# Patient Record
Sex: Female | Born: 1940 | Race: White | Hispanic: No | Smoking: Former smoker
Health system: Southern US, Community
[De-identification: ages and names within clinical notes are randomized; demographics above are authoritative.]

## PROBLEM LIST (undated history)

## (undated) DIAGNOSIS — F29 Unspecified psychosis not due to a substance or known physiological condition: Secondary | ICD-10-CM

## (undated) DIAGNOSIS — I639 Cerebral infarction, unspecified: Secondary | ICD-10-CM

## (undated) DIAGNOSIS — C8590 Non-Hodgkin lymphoma, unspecified, unspecified site: Secondary | ICD-10-CM

## (undated) DIAGNOSIS — J449 Chronic obstructive pulmonary disease, unspecified: Secondary | ICD-10-CM

## (undated) DIAGNOSIS — K7030 Alcoholic cirrhosis of liver without ascites: Secondary | ICD-10-CM

## (undated) DIAGNOSIS — I10 Essential (primary) hypertension: Secondary | ICD-10-CM

## (undated) DIAGNOSIS — E7800 Pure hypercholesterolemia, unspecified: Secondary | ICD-10-CM

## (undated) DIAGNOSIS — E039 Hypothyroidism, unspecified: Secondary | ICD-10-CM

## (undated) DIAGNOSIS — J45909 Unspecified asthma, uncomplicated: Secondary | ICD-10-CM

## (undated) DIAGNOSIS — F1011 Alcohol abuse, in remission: Secondary | ICD-10-CM

---

## 2001-05-17 ENCOUNTER — Inpatient Hospital Stay (HOSPITAL_COMMUNITY): Admission: EM | Admit: 2001-05-17 | Discharge: 2001-05-30 | Payer: Self-pay | Admitting: Emergency Medicine

## 2001-05-18 ENCOUNTER — Encounter: Payer: Self-pay | Admitting: Family Medicine

## 2001-05-24 ENCOUNTER — Encounter: Payer: Self-pay | Admitting: Family Medicine

## 2001-05-26 ENCOUNTER — Encounter: Payer: Self-pay | Admitting: Family Medicine

## 2001-06-06 ENCOUNTER — Encounter: Admission: RE | Admit: 2001-06-06 | Discharge: 2001-06-06 | Payer: Self-pay | Admitting: Family Medicine

## 2001-06-18 ENCOUNTER — Ambulatory Visit (HOSPITAL_COMMUNITY): Admission: RE | Admit: 2001-06-18 | Discharge: 2001-06-18 | Payer: Self-pay | Admitting: Family Medicine

## 2001-06-18 ENCOUNTER — Encounter: Payer: Self-pay | Admitting: Family Medicine

## 2002-10-17 ENCOUNTER — Emergency Department (HOSPITAL_COMMUNITY): Admission: EM | Admit: 2002-10-17 | Discharge: 2002-10-17 | Payer: Self-pay | Admitting: *Deleted

## 2002-10-18 ENCOUNTER — Inpatient Hospital Stay (HOSPITAL_COMMUNITY): Admission: EM | Admit: 2002-10-18 | Discharge: 2002-10-28 | Payer: Self-pay | Admitting: *Deleted

## 2002-11-30 ENCOUNTER — Inpatient Hospital Stay (HOSPITAL_COMMUNITY): Admission: AD | Admit: 2002-11-30 | Discharge: 2002-12-04 | Payer: Self-pay | Admitting: Psychiatry

## 2003-01-09 ENCOUNTER — Encounter: Payer: Self-pay | Admitting: Emergency Medicine

## 2003-01-09 ENCOUNTER — Inpatient Hospital Stay (HOSPITAL_COMMUNITY): Admission: EM | Admit: 2003-01-09 | Discharge: 2003-02-13 | Payer: Self-pay | Admitting: Emergency Medicine

## 2003-01-10 ENCOUNTER — Encounter: Payer: Self-pay | Admitting: Cardiology

## 2003-01-10 ENCOUNTER — Encounter: Payer: Self-pay | Admitting: Pulmonary Disease

## 2003-01-11 ENCOUNTER — Encounter: Payer: Self-pay | Admitting: Pulmonary Disease

## 2003-01-12 ENCOUNTER — Encounter: Payer: Self-pay | Admitting: Pulmonary Disease

## 2003-01-13 ENCOUNTER — Encounter: Payer: Self-pay | Admitting: Pulmonary Disease

## 2003-01-13 ENCOUNTER — Encounter: Payer: Self-pay | Admitting: Critical Care Medicine

## 2003-01-14 ENCOUNTER — Encounter: Payer: Self-pay | Admitting: Pulmonary Disease

## 2003-01-15 ENCOUNTER — Encounter: Payer: Self-pay | Admitting: Pulmonary Disease

## 2003-01-16 ENCOUNTER — Encounter: Payer: Self-pay | Admitting: Pulmonary Disease

## 2003-01-16 ENCOUNTER — Encounter: Payer: Self-pay | Admitting: Critical Care Medicine

## 2003-01-17 ENCOUNTER — Encounter: Payer: Self-pay | Admitting: Pulmonary Disease

## 2003-01-17 ENCOUNTER — Encounter: Payer: Self-pay | Admitting: Critical Care Medicine

## 2003-01-19 ENCOUNTER — Encounter: Payer: Self-pay | Admitting: Pulmonary Disease

## 2003-01-20 ENCOUNTER — Encounter: Payer: Self-pay | Admitting: Pulmonary Disease

## 2003-01-21 ENCOUNTER — Encounter: Payer: Self-pay | Admitting: Pulmonary Disease

## 2003-01-22 ENCOUNTER — Encounter: Payer: Self-pay | Admitting: Pulmonary Disease

## 2003-01-23 ENCOUNTER — Encounter: Payer: Self-pay | Admitting: Pulmonary Disease

## 2003-01-24 ENCOUNTER — Encounter: Payer: Self-pay | Admitting: Pulmonary Disease

## 2003-01-27 ENCOUNTER — Encounter: Payer: Self-pay | Admitting: Pulmonary Disease

## 2003-01-28 ENCOUNTER — Encounter: Payer: Self-pay | Admitting: Internal Medicine

## 2003-01-29 ENCOUNTER — Encounter: Payer: Self-pay | Admitting: Internal Medicine

## 2003-01-30 ENCOUNTER — Encounter: Payer: Self-pay | Admitting: Internal Medicine

## 2003-01-31 ENCOUNTER — Encounter: Payer: Self-pay | Admitting: Internal Medicine

## 2003-02-05 ENCOUNTER — Encounter: Payer: Self-pay | Admitting: Internal Medicine

## 2003-06-01 ENCOUNTER — Encounter: Payer: Self-pay | Admitting: Emergency Medicine

## 2003-06-01 ENCOUNTER — Inpatient Hospital Stay (HOSPITAL_COMMUNITY): Admission: EM | Admit: 2003-06-01 | Discharge: 2003-06-03 | Payer: Self-pay | Admitting: Emergency Medicine

## 2003-06-21 ENCOUNTER — Emergency Department (HOSPITAL_COMMUNITY): Admission: EM | Admit: 2003-06-21 | Discharge: 2003-06-22 | Payer: Self-pay | Admitting: *Deleted

## 2003-06-21 ENCOUNTER — Encounter: Payer: Self-pay | Admitting: *Deleted

## 2003-07-30 ENCOUNTER — Encounter: Payer: Self-pay | Admitting: Emergency Medicine

## 2003-07-30 ENCOUNTER — Inpatient Hospital Stay (HOSPITAL_COMMUNITY): Admission: EM | Admit: 2003-07-30 | Discharge: 2003-08-01 | Payer: Self-pay | Admitting: Emergency Medicine

## 2003-08-04 ENCOUNTER — Encounter: Admission: RE | Admit: 2003-08-04 | Discharge: 2003-08-19 | Payer: Self-pay | Admitting: Family Medicine

## 2003-08-31 ENCOUNTER — Encounter: Payer: Self-pay | Admitting: Emergency Medicine

## 2003-08-31 ENCOUNTER — Inpatient Hospital Stay (HOSPITAL_COMMUNITY): Admission: AD | Admit: 2003-08-31 | Discharge: 2003-09-04 | Payer: Self-pay | Admitting: Emergency Medicine

## 2003-09-30 ENCOUNTER — Encounter: Admission: RE | Admit: 2003-09-30 | Discharge: 2003-09-30 | Payer: Self-pay | Admitting: *Deleted

## 2003-10-30 ENCOUNTER — Emergency Department (HOSPITAL_COMMUNITY): Admission: EM | Admit: 2003-10-30 | Discharge: 2003-10-30 | Payer: Self-pay | Admitting: Emergency Medicine

## 2003-12-12 ENCOUNTER — Inpatient Hospital Stay (HOSPITAL_COMMUNITY): Admission: EM | Admit: 2003-12-12 | Discharge: 2003-12-16 | Payer: Self-pay | Admitting: Emergency Medicine

## 2003-12-26 ENCOUNTER — Inpatient Hospital Stay (HOSPITAL_COMMUNITY): Admission: EM | Admit: 2003-12-26 | Discharge: 2004-01-06 | Payer: Self-pay | Admitting: Emergency Medicine

## 2004-02-11 ENCOUNTER — Emergency Department (HOSPITAL_COMMUNITY): Admission: EM | Admit: 2004-02-11 | Discharge: 2004-02-11 | Payer: Self-pay | Admitting: Emergency Medicine

## 2004-03-11 ENCOUNTER — Encounter: Admission: RE | Admit: 2004-03-11 | Discharge: 2004-03-11 | Payer: Self-pay | Admitting: Internal Medicine

## 2004-03-24 ENCOUNTER — Emergency Department (HOSPITAL_COMMUNITY): Admission: EM | Admit: 2004-03-24 | Discharge: 2004-03-24 | Payer: Self-pay | Admitting: Family Medicine

## 2004-03-30 ENCOUNTER — Encounter: Admission: RE | Admit: 2004-03-30 | Discharge: 2004-03-30 | Payer: Self-pay | Admitting: Internal Medicine

## 2004-04-13 ENCOUNTER — Encounter: Admission: RE | Admit: 2004-04-13 | Discharge: 2004-04-13 | Payer: Self-pay | Admitting: Internal Medicine

## 2004-04-26 ENCOUNTER — Encounter: Admission: RE | Admit: 2004-04-26 | Discharge: 2004-04-26 | Payer: Self-pay | Admitting: Internal Medicine

## 2004-04-29 ENCOUNTER — Encounter: Admission: RE | Admit: 2004-04-29 | Discharge: 2004-04-29 | Payer: Self-pay | Admitting: Internal Medicine

## 2004-05-20 ENCOUNTER — Encounter: Admission: RE | Admit: 2004-05-20 | Discharge: 2004-05-20 | Payer: Self-pay | Admitting: Internal Medicine

## 2004-08-13 ENCOUNTER — Encounter: Admission: RE | Admit: 2004-08-13 | Discharge: 2004-08-13 | Payer: Self-pay | Admitting: Internal Medicine

## 2004-11-09 ENCOUNTER — Ambulatory Visit: Payer: Self-pay | Admitting: Internal Medicine

## 2005-01-27 ENCOUNTER — Ambulatory Visit: Payer: Self-pay | Admitting: Internal Medicine

## 2005-05-12 ENCOUNTER — Ambulatory Visit: Payer: Self-pay | Admitting: Internal Medicine

## 2005-05-20 ENCOUNTER — Ambulatory Visit: Payer: Self-pay | Admitting: Internal Medicine

## 2005-10-01 ENCOUNTER — Ambulatory Visit: Payer: Self-pay | Admitting: Internal Medicine

## 2005-10-01 ENCOUNTER — Inpatient Hospital Stay (HOSPITAL_COMMUNITY): Admission: AD | Admit: 2005-10-01 | Discharge: 2005-10-03 | Payer: Self-pay | Admitting: Internal Medicine

## 2005-10-01 ENCOUNTER — Encounter: Payer: Self-pay | Admitting: Emergency Medicine

## 2005-10-02 ENCOUNTER — Ambulatory Visit: Payer: Self-pay | Admitting: Internal Medicine

## 2005-11-02 ENCOUNTER — Ambulatory Visit: Payer: Self-pay | Admitting: Internal Medicine

## 2005-11-04 ENCOUNTER — Encounter (HOSPITAL_COMMUNITY): Admission: RE | Admit: 2005-11-04 | Discharge: 2006-02-02 | Payer: Self-pay | Admitting: Nephrology

## 2005-11-30 ENCOUNTER — Ambulatory Visit (HOSPITAL_COMMUNITY): Admission: RE | Admit: 2005-11-30 | Discharge: 2005-11-30 | Payer: Self-pay | Admitting: Family Medicine

## 2005-12-28 ENCOUNTER — Ambulatory Visit: Payer: Self-pay | Admitting: Internal Medicine

## 2006-02-03 ENCOUNTER — Encounter (HOSPITAL_COMMUNITY): Admission: RE | Admit: 2006-02-03 | Discharge: 2006-05-04 | Payer: Self-pay | Admitting: Internal Medicine

## 2006-02-08 ENCOUNTER — Ambulatory Visit (HOSPITAL_COMMUNITY): Admission: RE | Admit: 2006-02-08 | Discharge: 2006-02-08 | Payer: Self-pay | Admitting: Internal Medicine

## 2006-02-08 ENCOUNTER — Ambulatory Visit: Payer: Self-pay | Admitting: Internal Medicine

## 2006-02-10 ENCOUNTER — Ambulatory Visit: Payer: Self-pay | Admitting: Internal Medicine

## 2006-02-10 ENCOUNTER — Inpatient Hospital Stay (HOSPITAL_COMMUNITY): Admission: AD | Admit: 2006-02-10 | Discharge: 2006-02-13 | Payer: Self-pay | Admitting: Internal Medicine

## 2006-02-21 ENCOUNTER — Ambulatory Visit: Payer: Self-pay | Admitting: Internal Medicine

## 2006-03-03 ENCOUNTER — Ambulatory Visit: Payer: Self-pay | Admitting: Internal Medicine

## 2006-03-03 ENCOUNTER — Ambulatory Visit (HOSPITAL_COMMUNITY): Admission: RE | Admit: 2006-03-03 | Discharge: 2006-03-03 | Payer: Self-pay | Admitting: Internal Medicine

## 2006-04-07 ENCOUNTER — Ambulatory Visit: Payer: Self-pay | Admitting: Internal Medicine

## 2006-05-12 ENCOUNTER — Ambulatory Visit: Payer: Self-pay | Admitting: Internal Medicine

## 2006-05-15 ENCOUNTER — Encounter (HOSPITAL_COMMUNITY): Admission: RE | Admit: 2006-05-15 | Discharge: 2006-08-13 | Payer: Self-pay | Admitting: Internal Medicine

## 2007-01-03 DIAGNOSIS — E039 Hypothyroidism, unspecified: Secondary | ICD-10-CM

## 2007-01-03 DIAGNOSIS — E785 Hyperlipidemia, unspecified: Secondary | ICD-10-CM

## 2007-01-03 DIAGNOSIS — F319 Bipolar disorder, unspecified: Secondary | ICD-10-CM | POA: Insufficient documentation

## 2007-01-03 DIAGNOSIS — K7030 Alcoholic cirrhosis of liver without ascites: Secondary | ICD-10-CM

## 2007-01-03 DIAGNOSIS — C8409 Mycosis fungoides, extranodal and solid organ sites: Secondary | ICD-10-CM | POA: Insufficient documentation

## 2007-01-03 DIAGNOSIS — Z8679 Personal history of other diseases of the circulatory system: Secondary | ICD-10-CM | POA: Insufficient documentation

## 2007-01-03 DIAGNOSIS — E119 Type 2 diabetes mellitus without complications: Secondary | ICD-10-CM | POA: Insufficient documentation

## 2007-01-06 ENCOUNTER — Emergency Department (HOSPITAL_COMMUNITY): Admission: EM | Admit: 2007-01-06 | Discharge: 2007-01-06 | Payer: Self-pay | Admitting: Emergency Medicine

## 2007-02-19 ENCOUNTER — Telehealth: Payer: Self-pay | Admitting: *Deleted

## 2007-03-27 ENCOUNTER — Telehealth (INDEPENDENT_AMBULATORY_CARE_PROVIDER_SITE_OTHER): Payer: Self-pay | Admitting: *Deleted

## 2007-04-23 ENCOUNTER — Telehealth (INDEPENDENT_AMBULATORY_CARE_PROVIDER_SITE_OTHER): Payer: Self-pay | Admitting: *Deleted

## 2007-04-30 ENCOUNTER — Encounter (INDEPENDENT_AMBULATORY_CARE_PROVIDER_SITE_OTHER): Payer: Self-pay | Admitting: *Deleted

## 2007-04-30 ENCOUNTER — Ambulatory Visit: Payer: Self-pay | Admitting: *Deleted

## 2007-04-30 DIAGNOSIS — R634 Abnormal weight loss: Secondary | ICD-10-CM | POA: Insufficient documentation

## 2007-04-30 LAB — CONVERTED CEMR LAB
Blood Glucose, Fingerstick: 255
Hgb A1c MFr Bld: 10.8 %

## 2007-05-05 LAB — CONVERTED CEMR LAB
ALT: <8 units/L (ref 0–35)
AST: 9 units/L (ref 0–37)
Albumin: 3.7 g/dL (ref 3.5–5.2)
Alkaline Phosphatase: 58 units/L (ref 39–117)
BUN: 9 mg/dL (ref 6–23)
Basophils Absolute: 0 10*3/uL (ref 0.0–0.1)
Basophils Relative: 0 % (ref 0–1)
CO2: 27 meq/L (ref 19–32)
Calcium: 9.3 mg/dL (ref 8.4–10.5)
Chloride: 95 meq/L — ABNORMAL LOW (ref 96–112)
Creatinine, Ser: 0.58 mg/dL (ref 0.40–1.20)
Eosinophils Absolute: 0.1 10*3/uL (ref 0.0–0.7)
Eosinophils Relative: 1 % (ref 0–5)
Glucose, Bld: 232 mg/dL — ABNORMAL HIGH (ref 70–99)
HCT: 46.3 % — ABNORMAL HIGH (ref 36.0–46.0)
Hemoglobin: 15.1 g/dL — ABNORMAL HIGH (ref 12.0–15.0)
Lymphocytes Relative: 37 % (ref 12–46)
Lymphs Abs: 4.6 10*3/uL — ABNORMAL HIGH (ref 0.7–3.3)
MCHC: 32.6 g/dL (ref 30.0–36.0)
MCV: 89.4 fL (ref 78.0–100.0)
Monocytes Absolute: 0.7 10*3/uL (ref 0.2–0.7)
Monocytes Relative: 6 % (ref 3–11)
Neutro Abs: 7 10*3/uL (ref 1.7–7.7)
Neutrophils Relative %: 56 % (ref 43–77)
Platelets: 200 10*3/uL (ref 150–400)
Potassium: 3.9 meq/L (ref 3.5–5.3)
RBC: 5.18 M/uL — ABNORMAL HIGH (ref 3.87–5.11)
RDW: 13.7 % (ref 11.5–14.0)
Sodium: 139 meq/L (ref 135–145)
TSH: 1.494 microintl units/mL (ref 0.350–5.50)
Total Bilirubin: 0.3 mg/dL (ref 0.3–1.2)
Total Protein: 7.3 g/dL (ref 6.0–8.3)
WBC: 12.4 10*3/uL — ABNORMAL HIGH (ref 4.0–10.5)

## 2007-05-25 ENCOUNTER — Telehealth: Payer: Self-pay | Admitting: *Deleted

## 2007-06-03 ENCOUNTER — Emergency Department (HOSPITAL_COMMUNITY): Admission: EM | Admit: 2007-06-03 | Discharge: 2007-06-04 | Payer: Self-pay | Admitting: Emergency Medicine

## 2007-06-26 ENCOUNTER — Telehealth: Payer: Self-pay | Admitting: *Deleted

## 2007-08-07 ENCOUNTER — Ambulatory Visit (HOSPITAL_COMMUNITY): Admission: RE | Admit: 2007-08-07 | Discharge: 2007-08-07 | Payer: Self-pay | Admitting: Internal Medicine

## 2007-08-07 ENCOUNTER — Ambulatory Visit: Payer: Self-pay | Admitting: Internal Medicine

## 2007-09-26 ENCOUNTER — Telehealth: Payer: Self-pay | Admitting: *Deleted

## 2007-09-27 ENCOUNTER — Telehealth: Payer: Self-pay | Admitting: *Deleted

## 2007-09-29 ENCOUNTER — Inpatient Hospital Stay (HOSPITAL_COMMUNITY): Admission: AD | Admit: 2007-09-29 | Discharge: 2007-10-05 | Payer: Self-pay | Admitting: Internal Medicine

## 2007-09-29 ENCOUNTER — Encounter: Payer: Self-pay | Admitting: Emergency Medicine

## 2007-09-29 ENCOUNTER — Ambulatory Visit: Payer: Self-pay | Admitting: Internal Medicine

## 2007-12-11 ENCOUNTER — Encounter: Payer: Self-pay | Admitting: Internal Medicine

## 2008-03-24 ENCOUNTER — Telehealth (INDEPENDENT_AMBULATORY_CARE_PROVIDER_SITE_OTHER): Payer: Self-pay | Admitting: *Deleted

## 2008-04-23 ENCOUNTER — Encounter: Payer: Self-pay | Admitting: Internal Medicine

## 2008-06-05 ENCOUNTER — Encounter: Payer: Self-pay | Admitting: Internal Medicine

## 2008-07-24 ENCOUNTER — Telehealth (INDEPENDENT_AMBULATORY_CARE_PROVIDER_SITE_OTHER): Payer: Self-pay | Admitting: *Deleted

## 2008-07-25 ENCOUNTER — Ambulatory Visit: Payer: Self-pay | Admitting: Internal Medicine

## 2008-07-25 DIAGNOSIS — J9610 Chronic respiratory failure, unspecified whether with hypoxia or hypercapnia: Secondary | ICD-10-CM | POA: Insufficient documentation

## 2009-10-13 ENCOUNTER — Encounter: Payer: Self-pay | Admitting: Internal Medicine

## 2010-02-18 ENCOUNTER — Encounter: Payer: Self-pay | Admitting: Internal Medicine

## 2010-11-26 ENCOUNTER — Encounter: Payer: Self-pay | Admitting: Internal Medicine

## 2011-01-25 NOTE — Letter (Signed)
Summary: CMN for Nebulizer & Meds/[facility]  CMN for Nebulizer & Meds/[facility]   Imported By: Sherian Rein 02/23/2010 14:54:02  _____________________________________________________________________  External Attachment:    Type:   Image     Comment:   External Document

## 2011-01-25 NOTE — Letter (Signed)
Summary: CMN request for Neb & Meds/[facility]  CMN request for Neb & Meds/[facility]   Imported By: Sherian Rein 12/03/2010 12:29:49  _____________________________________________________________________  External Attachment:    Type:   Image     Comment:   External Document

## 2011-01-26 ENCOUNTER — Encounter: Payer: Self-pay | Admitting: Family Medicine

## 2011-05-10 NOTE — Assessment & Plan Note (Signed)
[facility]                             PULMONARY OFFICE NOTE   NAME:SERPONEJadi, Deyarmin                   MRN:          [id number]  DATE:08/07/2007                            DOB:          1941/05/02    HISTORY:  A 70 year old, white female who has been under the teaching  service at [employer] who presents with dyspnea dating back years  after having been last seen by [place] in 2004.  She carries a diagnosis of  O2-dependent COPD and end-stage liver disease and interestingly, has the  understanding that she takes oxygen 4 L per minute at rest and then  takes it off if she needs to get up and go anywhere.  She comes in  today, for example, but not wearing any oxygen complaining of shortness  of breath walking more than 25 feet.  She denies any variability in  terms of her dyspnea, fevers, chills, sweats, orthopnea, PND or leg  swelling.   PAST MEDICAL HISTORY:  1. COPD, O2-dependent.  2. 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  3. History of thyroid dysfunction and CHF.  4. Bipolar disorder.  5. History of strokes with Broca aphasia.  6. 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   MEDICATIONS:  Medications were reviewed in detail.  The patient actually  reports she is on Pulmicort 0.5 mg along with DuoNeb 5-6 times daily.   SOCIAL HISTORY:  The patient lives in [place].  She is still actively  smoking and lives with her son who is actively smoking and has power of  attorney.   FAMILY HISTORY:  Negative for respiratory disease.   REVIEW OF SYSTEMS:  Negative, except as outlined above.   PHYSICAL EXAMINATION:  GENERAL:  This is a chronically ill, white female  who appears approximately 10-15 years older than stated age.  VITAL SIGNS:  She is afebrile with normal vital signs.  HEENT:  Unremarkable.  Oropharynx is clear.  LUNGS:  Diminished breath sounds,  but no wheezing.  There is marked  increase in expiratory time.  CARDIAC:  Regular rate and rhythm without murmurs, rubs or gallops.  ABDOMEN:  Soft, benign without any palpable organomegaly or tenderness.  EXTREMITIES:  Warm without calf tenderness, cyanosis, clubbing or edema.   She has not had a chest x-ray since March 2007, when a chest x-ray  showed small, stable, right effusion and bilateral atelectatic changes.   IMPRESSION/PLAN:  Chronic oxygen-dependent respiratory failure.  I do  not believe it makes any sense to use 4 L at rest and then get up and  take the oxygen off.  I suspect that she would be better off on 2 L 24  hours a day.  I also do not think it makes sense to use Pulmicort and  albuterol and Atrovent the way she is doing and recommend instead that  she use Pulmicort 0.5 mg long with DuoNeb morning and night and then  every 4-6 hours during the day use the DuoNeb p.r.n.  I spent the longest time with this patient discussing end of life issues  in the context of a patient who is continuing to smoke against medical  advice and has progressed to the point of frank hypoxemic respiratory  failure associated with mild hypercarbia at least suggested by her most  recent bicarb level of 31.  She said she had flatly decided she did not  want to be placed on the ventilator at the end of life, but her son  said, we will put her on for 24 hours and then take her off if she does  not get better.  I think there is a major disconnect between the mother  and her son regarding this issue and tried to explain from a practical  perspective why the son's wishes are not reasonable.  On the other hand,  the mother's wish to continue to smoke until she is in respiratory  distress and then have her son go ahead and have her intubated makes no  sense either.   I suspect that there is significant psychopathology here between both  the mother and the son which a pulmonary clinic will not be  able to  resolve.  I referred her back to the teaching service for this purpose.     Charlaine Dalton. Sherene Sires, MD, [facility]  Electronically Signed    MBW/MedQ  DD: 08/07/2007  DT: 08/09/2007  Job #: [id number]
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
## 2011-05-13 NOTE — H&P (Signed)
[name], [name]                        ACCOUNT NO.:  [id number]   MEDICAL RECORD NO.:  [id number]                   PATIENT TYPE:  INP   LOCATION:  5702                                 FACILITY:  [facility]   PHYSICIAN:  Asencion Partridge, M.D.                  DATE OF BIRTH:  1941/04/17   DATE OF ADMISSION:  08/31/2003  DATE OF DISCHARGE:                                HISTORY & PHYSICAL   PRIMARY CARE PHYSICIAN:  Dr. Tresa Endo L. Vollmer at [facility].   CHIEF COMPLAINT:  Shortness of breath, cough with sputum, and chills for  about five days.   HISTORY OF PRESENT ILLNESS:  The patient is a 70 year old white female with  a history of bipolar disorder, COPD, Broca's aphasia, status post CVA, and  cirrhosis (secondary to alcohol abuse), who developed a cold with new  productive cough, increased shortness of breath and dyspnea on exertion, and  chills approximately five days prior to admission.  She does have a sick  contact and describes a whitish/clear sputum.  Denies fever or sweats.  States she is always cold.  Reports chest pain with coughing.  Tried over-  the-counter cough medicine without much relief.  Using her Atrovent MDI  about three times a day (normally uses it one to two times a day).  Her O2  saturation was 85% on room air when she arrived in the ER.   PAST MEDICAL HISTORY:  1. Bipolar disorder.  2. CVA in January 2004.  3. 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  4. COPD.  5. Cirrhosis secondary to alcohol abuse.  6. Hypothyroidism.   PAST SURGICAL HISTORY:  None.   MEDICATIONS:  1. Synthroid 0.1 mg p.o. daily.  2. Spironolactone 50 mg p.o. daily.  3. Aspirin 325 mg p.o. daily.  4. Risperdal 2 mg p.o. b.i.d.  5. Temazepam 30 mg p.o. q.h.s.  6. Depakote 500 mg p.o. q.a.m. and q.h.s.  7. Atrovent MDI one to two times per day.   ALLERGIES:  No known drug allergies but allergic to BEE STINGS and POISON  IVY.   SOCIAL HISTORY:  She lives with her friend and health  care power of  attorney, Onalee Hua Alen Dye.  She has a [place] named Gypsy.  She does smoke  tobacco, approximately one pack a day for most of her life, but states  that she has smoked much less since this illness began.  She does have three  children, two who live in [place], one in [place] state.  She does have a  history of alcohol abuse in the past but last drank alcohol in December of  2003.  She is on disability.   FAMILY HISTORY:  Unable to obtain secondary to the patient's aphasia.   REVIEW OF SYSTEMS:  The patient denies abdominal pain, difficulty voiding or  other urinary complaints, nausea, vomiting, headaches or visual changes.  She states she is not  on home O2 and has been taking normal orals (p.o.) for  her.  The patient does report a chronic rash on her back and legs that  worsened in the past with hydrocortisone cream.  The patient requests to be  DNR/DNI.   PHYSICAL EXAMINATION:  VITAL SIGNS:  Temperature 99.1 to 97.6, heart rate 98  to 80, blood pressure 102/61 to 130/76, respiratory rate 24 to 22, O2  saturation 92% to 96% on 2 L of oxygen by nasal cannula.  GENERAL:  Older white female with expressive aphasia demonstrated through  substitution of incorrect words when speaking, no acute distress, drowsy,  but easily arousable.  HEENT:  PERRL.  EOMI.  Sclerae are white.  Conjunctivae pink and not  injected.  No nasal discharge, erythema or edema.  TMs opaque, pearly gray  with normal landmarks bilaterally.  There was a white, cream-colored lining  to her tongue that was removable with a tongue blade.  Slightly dry mucous  membranes with poor dentition.  Oropharynx was without exudate.  There was  mild oropharyngeal erythema.  She does have right cheek telangiectasias.  NECK:  No lymphadenopathy.  Supple  LUNGS:  Faint wheezes on the left anteriorly and at the base.  Decreased  effort on the right, with fair air flow on the left.  No respiratory  distress,  retractions or accessory muscle use.  CARDIOVASCULAR:  Regular rate and rhythm.  No murmurs, rubs, or gallops.  Radial and DP pulses 2+ and symmetrical bilaterally.  ABDOMEN:  Abdomen was protuberant, soft with bowel sounds and was tender to  palpation in the right upper quadrant.  I was unable to palpate the liver  well secondary to the patient's habitus and voluntary guarding.  There was  no tympany.  EXTREMITIES:  Trace edema in bilateral lower extremities to her mid-shins.  No cyanosis.  BACK:  There is a lower back rash bilaterally, left greater than right, with  flat. erythematous, flaky, dry skin, no drainage or vesicles.  SKIN:  Another patch of a rash similar to that seen on the back is noted on  the left upper thigh.  NEUROLOGIC:  Cranial nerves II-XII intact.  DTRs 1+, bilateral.  Patellar  strength 4+/5, bilateral upper extremities, with decreased strength in the  bilateral iliopsoae, hamstrings and gastrocnemii and quads (4/5), no clonus.   LABORATORIES:  Blood cultures x2 pending.  White count 7.3 with 52%  neutrophils, 33% lymphs, 11% monos, hemoglobin elevated at 16, hematocrit  elevated at 47.6, platelets low at 130,000, her RBC elevated at 5.2, MCV  normal at 91.4.  The i-STAT BMP showed a serum sodium of 138, potassium 4.1,  chloride 98, bicarb elevated at 31, BUN 14, creatinine 1.4, glucose 103; pH  7.373, PCO2 elevated at 53.8.   Chest x-ray, PA and lateral, showed bibasilar atelectasis.   ASSESSMENT AND PLAN:  [age]year-old female with bipolar disorder,  Broca's aphasia, status post cerebrovascular accident, cirrhosis, chronic  obstructive pulmonary disease and tobacco abuse, presents with an acute  chronic obstructive pulmonary disease exacerbation likely secondary to her  upper respiratory infection.   1. Chronic obstructive pulmonary disease exacerbation.  Continue oxygen (O2)    to keep saturations 88-92%.  Albuterol and Atrovent nebulizer treatments      every four hours (q.4 h) and every two hours as needed (q.2 h. p.r.n.).     Space nebulizer treatments out when able and change to metered-dose     inhaler with spacer later on.  Begin  prednisone 60 mg orally (p.o.) daily     for 10 days and azithromycin (as she has an increased cough and increased     sputum) for 5 days.  Check her peak loads pre and post nebulizers.  The     patient may need short-term oxygen at home.  Encourage smoking cessation.  2. Bipolar disorder.  Stable.  Continue outpatient medications.  3. Broca's aphasia, status post cerebrovascular accident.  Continue daily     aspirin.  Appears stable, per friend.  4. Cirrhosis secondary to alcohol abuse in the past.  Continue     spironolactone.  There was abdominal tenderness to palpation in the right     upper quadrant possibly secondary to her cirrhosis.  No further workup at     this time, but keep an eye on this.  5. Erythematous flaky rash.  Differential include atopic dermatitis/eczema     versus contact dermatitis versus seborrheic dermatitis.  We will     prescribe an emollient cream for now as hydrocortisone worsened the rash     in the past, per the patient.  6. Hypothyroidism.  Continue Synthroid.  7. Oral candidiasis.  Start nystatin swish-and-swallow.  8. Thrombocytopenia.  Platelets are at 130,000 on admission.  Follow these     and see if this is new or something that she has had in the past.  9. Fluids, electrolytes, and nutrition:  The patient is hungry and we will     place on a regular diet.  10.      Code status:  The patient requests do not resuscitate/do not     intubate, so paperwork has been filled out and placed in the chart.  She     also has a health care power of attorney, her friend, Dian Queen, who says     that hospital has this paperwork on file.  11.      Disposition:  Anticipate discharge home in a couple of days, once     her respiratory status improves and she is able to be weaned down,  if not     off of her oxygen.  However, she may require home oxygen (O2).  Follow     and consult social work as needed.      Georgina Peer, M.D.                 Asencion Partridge, M.D.    JM/MEDQ  D:  09/03/2003  T:  09/03/2003  Job:  [id number]   cc:   Dala Dock

## 2011-05-13 NOTE — Discharge Summary (Signed)
[place] [facility]  Patient:    [name], [name]                     MRN: [id number] Adm. Date:  [id number] Disc. Date: [id number] Attending:  Sanjuana Letters Dictator:   Maryelizabeth Rowan, M.D. CC:         [facility]   Discharge Summary  DISCHARGE DIAGNOSES:  1. End-stage liver disease, stage III.  2. Anasarca.  3. Congestive heart failure.  4. Alcohol dependence.  5. Hypothyroidism.  6. Hyperalbuinemia secondary to #1.  7. 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  8. Cirrhosis.  9. Portal hypertension. 10. Pulmonary edema. 11. Chronic obstructive pulmonary disease.  DISCHARGE MEDICATIONS: 1. Albuterol inhaler two puffs q.6h. 2. Atrovent inhaler two puffs q.6h. 3. Lactulose 30 ml twice a day. 4. Spironolactone 20 mg q.d. 5. Levothyroxine 75 mcg q.d. 6. Furosemide 60 mg q.d. 7. K-Dur 40 mEq q.d. 8. Nicotine patch. 9. Multivitamin.  DIET:  Low sodium, 2 mg/kg protein per day diet as directed in the hospital.  FOLLOWUP:  Follow up at Urgent Care.  The patient is instructed to go home Friday, June 01, 2001.  Home health to provide restorative nursing, home oxygen and nutrition management.  HISTORY OF PRESENT ILLNESS:  The patient is a 70 year old, white female with significant alcohol history who presented to Urgent Care with increased dyspnea, 20 pound weight gain over the past three weeks.  She reportedly also started having hallucinations prior to presentation.  She was initially awake and alert on presentation, but had decreased level of consciousness when evaluated in the ER.  She had increased respiratory distress and was started on BiPAP in the ER.  The patient was aggressively diuresed with Lasix and an emergent paracentesis was performed with return of 7 L of fluid.  After the paracentesis, respiratory status and mental status improved remarkably.  The patient returned to normal mentation in approximately two to three days  after admission.  HOSPITAL COURSE:  #1 - END-STAGE LIVER DISEASE WITH ANASARCA AND SIGNIFICANT ASCITES.  She is currently stable at discharge.  The patient had an ultrasound on May 26, 2001, which revealed only a small amount of sparse fluid.  The patient is still with significant abdominal girth.  #2 - COAGULOPATHY SECONDARY TO CIRRHOSIS.  #3 - HYPERALBUINEMIA AND HEPATIC ENCEPHALOPATHY SECONDARY TO CIRRHOSIS:  The patient recovered fully while hospitalized and it would be recommended to avoid any benzodiazepines in the future.  #4 - LONG TERM MANAGEMENT OF CIRRHOSIS:  The patient has received hepatitis A and B vaccines as well as an ultrasound of her liver which is negative for hepatocellular cancer.  Alpha fetoprotein level was obtained and within normal limits.  These two tests were obtained to assess the risk of liver cancer which is 20 times increased in cirrhosis.  #5 - PORTAL HYPERTENSION SECONDARY TO CIRRHOSIS:  #6 - PULMONARY EDEMA:  Pulmonary edema secondary to benzodiazepine administration and decreased respiratory status, entirely resolved at discharge.  The patient does have shallow breathing and has been encouraged to use incentive spirometry q.i.d. and will ask home health nursing to follow up with patient regarding this also.  #7 - HISTORY OF CHRONIC OBSTRUCTIVE PULMONARY DISEASE:  The patient is on albuterol and Atrovent MDIs and she has been instructed in the hospital in correct use of MDIs.  #8 - HYPOTHYROIDISM:  She came in on 25 mcg, however, her TSH level was elevated.  Therefore, Synthroid was titrated  to 75 mcg q.d. and she is on this at discharge.  #9 - HISTORY OF TOBACCO ABUSE:  She is currently on nicotine patch while in hospital and encouraged not to smoke especially due to oxygen requirement at home.  #10 - HYPOXIA SECONDARY TO CHRONIC OBSTRUCTIVE PULMONARY DISEASE:  The patient was ambulated prior to discharge and had drop in O2 saturations to  88% with cyanosis of fingers and ear lobes.  Therefore, it is recommended that the patient receive 2-3 L of O2 at home.  PROCEDURES: 1. On May 24, paracentesis. 2. On May 30, peripherally inserted central catheter line placement. 3. On May 31, ultrasound.  CONSULTATION:  PM&R for questionable rehabilitation.  Dr. Johna Roles evaluated for possible inpatient rehabilitation which the patient was not a candidate for.  FOLLOWUP:  The patient is to follow up with Urgent Care in two days. DD:  05/30/01 TD:  05/31/01 Job: [id number]

## 2011-05-13 NOTE — Consult Note (Signed)
NAMEHARLO, [name] NO.:  [id number]   MEDICAL RECORD NO.:  [id number]                   PATIENT TYPE:  INP   LOCATION:  2116                                 FACILITY:  [facility]   PHYSICIAN:  Iva Boop, M.D. [facility]           DATE OF BIRTH:  [birth year]   DATE OF CONSULTATION:  01/15/2003  DATE OF DISCHARGE:                                   CONSULTATION   REASON FOR CONSULTATION:  Ileus.   HISTORY:  This is a 70 year old white female with alcoholic cirrhosis,  history of  hepatic encephalopathy and psychosis and COPD, who was admitted  on January 15 with respiratory failure thought secondary to COPD.  She was  found to have an elevated TSH above 20 and protein malnutrition with  prealbumin of 4.  She has developed an ileus. X-rays have showed small and  large bowel ileus.  CT scan has shown dilation of the small intestine as  well as the colon into the colon which is decompressed.  The radiologist  thought it was most likely and ileus versus a mechanical obstruction.  Nasogastric tube output the last four days has been 1250, 400, 150, and then  50 cc.  One bowel movement this morning recorded.  She has had the CT  showing some ascites on the 18th.  Original  ultrasound of the abdomen was  01/10/2003, showed a limited amount of fluid in the right lower quadrant too  small to perform a paracentesis.  The latest abdominal films report show  large and small bowel ileus.  She does have the nasogastric tube in and she  is intubated as well as noted above.   ALLERGIES:  Drug allergies - none known.   MEDICATIONS:  1. Albuterol  2. Atrovent  3.  4. Aldactone  5. Pulmicort  6. Novolog  7. Synthroid  8. Zosyn  9. Protonix  10.      Sliding scale insulin  11.      TPN   SOCIAL HISTORY:  She is widowed.  Drinks approximately a pint of liquor or  more a day.  Positive tobacco.   FAMILY HISTORY:  Not known, unobtainable at this time.   PAST  MEDICAL HISTORY:  1. Status post hernia  2. Cirrhosis  3. Anasarca in the past  4. Ethanol abuse.  5. Hypothyroidism.  6. Decreased albumin.  7. Hepatic encephalopathy  8. Portal hypotension.  9. Previous psychosis with admissions to [facility].   I do not know if the patient has varices.  It does not appear that she has  ever had endoscopy from what I can tell.  There were none indicated on the  CT report.   REVIEW OF SYSTEMS:  Not obtainable.   LABORATORY DATA:  Hemoglobin 14.1, hematocrit 44, white count 11, BNP 1530,  phosphorus 2.5, magnesium 1.7, potassium 3.7.  The other electrolytes are  normal.  Her CO2 is 29, chloride 108.  Amylase, lipase normal, ammonia 45,  liver function tests are normal, albumin 1.9, pro-time INR 1.6 with PT 18,  too numerous to count red cells in urinalysis.  She has some uric acid  crystals, TSH 22, .126.   PHYSICAL EXAMINATION:  GENERAL:  Obese, chronically and acutely ill  intubated white woman. She awakens to voice and tactile stimulus.  VITAL SIGNS:  Blood pressure 112/66, pulse 94, respirations 19,  temperature  98.9.  HEENT:  The eyes show no icterus.  Oropharynx is intubated.  CHEST:  Coarse breath sounds bilaterally.Marland Kitchen  HEART:  Distant S1, S2.  ABDOMEN:  Obese, distended; I can depress my hand. There is some scar tissue  in an area of an umbilical scar. There is no obvious hernia.  Bowel sounds  are diminished.  It is obese and distended.  I cannot tell if there is clear-  cut ascites on the clinical exam.  RECTAL:  Not performed.  She has had a rectal tube inserted with no output.  EXTREMITIES:  Erythema and coolness in the right foot  She has a brawny  indurated edema with hyperpigmentation of both extremities.  NEUROLOGIC:  She does follow simple commands to some degree but not  consistently and cannot detect asterixis.  SKIN:  Does not show any clear-cut spider telangiectasis.  LYMPH NODES:  There are no cervical or  supraclavicular nodes palpated.   ASSESSMENT:  Ileus with unlikely possibility of a distal colonic  obstruction.  This is probably related to her acute illness and intubation.  She has alcoholic cirrhosis with ascites which has probably increased since  her ultrasound.  She is on antibiotics with a probable reasonable coverage  against spontaneous bacterial peritonitis.  She is critically ill with  severe respiratory failure at this point and also hypothyroidism and numbers  that indicate congestive heart failure.   PLAN:  1. Continue current therapy.  2. Maximize electrolytes to be normal with supplementation.  3. Rectal tube is placed.  We will followup.  May need a Gastrografin enema     if there are persistent problems.                                               Iva Boop, M.D. [facility]    CEG/MEDQ  D:  01/15/2003  T:  01/16/2003  Job:  [id number]

## 2011-05-13 NOTE — Discharge Summary (Signed)
NAMEAYZA, RIPOLL                        ACCOUNT NO.:  [id number]   MEDICAL RECORD NO.:  [id number]                   PATIENT TYPE:  IPS   LOCATION:  0403                                 FACILITY:  [facility]   PHYSICIAN:  Geoffery Lyons, M.D.                   DATE OF BIRTH:  1941/08/19   DATE OF ADMISSION:  10/18/2002  DATE OF DISCHARGE:  10/28/2002                                 DISCHARGE SUMMARY   CHIEF COMPLAINT AND PRESENT ILLNESS:  This was the first admission to [facility] for this 70 year old widowed, white female  involuntarily committed.  History of psychosis.  Was petitioned by her  caretaker.  Terminally ill with cirrhosis of the liver.  Recently had lost  her husband and she was claiming to be God.  Felt the patient was a danger  to herself, afraid that people would take advantage of her.  Not sure why  she had been admitted but she is mad at her caretaker for committing her.  Denies any psychotic symptoms.  Denied any auditory or visual  hallucinations.  Denies any suicidal or homicidal ideation.   PAST PSYCHIATRIC HISTORY:  First time at [facility].  No previous  psychiatric treatment apparently.   ALCOHOL/DRUG HISTORY:  Denies the use or abuse of any substances.   PAST MEDICAL HISTORY:  Cirrhosis, hypothyroidism.   MEDICATIONS:  Potassium chloride 20 mEq daily, Lasix 40 mg daily, Aldactone  100 mg twice a day, Levoxyl 0.5 mg, 1/2 tab every day, lactulose 2  tablespoons twice a day and Glucophage 500 mg twice a day and Vioxx 25 mg  every day.   PHYSICAL EXAMINATION:  Performed and failed to show any acute findings.   MENTAL STATUS EXAM:  Well-developed, overweight, unkempt female.  Cooperative.  Good eye contact.  Speech is clear.  Very pleasant and  talkative.  Thought processes are positive for delusional ideas and  questionable paranoia.  Denied any suicidal or homicidal ideation.  No  evidence of auditory or visual hallucinations.   Cognition well-preserved.   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   AXIS II:  No diagnosis.   AXIS III:  1. Cirrhosis of the liver.  2. Hypothyroidism.  3. Non-insulin-dependent diabetes mellitus.   AXIS IV:  Moderate.   AXIS V:  Global Assessment of Functioning upon admission 25-30; highest  Global Assessment of Functioning in the last year 60-65.   LABORATORY DATA:  CBC was within normal limits.  Blood chemistries were  within normal limits.  Thyroid profile was within normal limits.   HOSPITAL COURSE:  She was admitted and started intensive individual and  group psychotherapy.  She was initially very guarded, very reserved, not  volunteering much information.  We did not know that the husband had died  10/20/2002.  There  was some issue with her caretaker.  There were some  suspicions on her part that the caretaker was trying to take advantage of  her.  There were some apparent conflict with her family in [place] and she  had apparently threatened them.  She was initially very guarded but she did  evidence a lot of pressured speech, hyperthymia, some expansiveness and  there were some grandiose delusions.  She did, at one time, mentioned her  being God and sending her son to [employer].  She continued to experience  fluctuations, being labile, irritable, angry, tearful, then elated, very  circumstantial with no evidence of auditory hallucinations.  Some  association issues having to deal with her having a big abdomen, that Buddha  being God-like and that her being God by association with Buddha and the  abdomen.  She was quite irritable and became somewhat threatening to staff  in the course of her stay.  We started working with medications.  Basically,  she was kept on the same medications she was taking.  She was given  albuterol inhaler.  Continued to work with the  Synthroid.  Seroquel was  started and it was gradually increased to 100 mg twice a day and 200 mg at  night.  As the hospitalization progressed and help by the medication, there  was marked decrease in her irritability and her anger.  She denied that she  had threatened the family members and she was wanting to be discharged.  On  October 26, 2002, upon day of discharge, she started feeling better.  She  was sleeping all night.  There was no evidence of irritability.  She became  more pleasant to work with, more predictable.  On October 28, 2002, her mood  had improved.  Her affect was bright, broad.  No suicidal ideation.  No  homicidal ideation.  She was willing to follow up on an outpatient basis.  There was no spontaneous delusional content.  We went ahead and discharged  her to outpatient follow-up.   DISCHARGE DIAGNOSES:   AXIS I:  Bipolar disorder, manic with some psychotic features versus  psychotic disorder not otherwise specified.   AXIS II:  No diagnosis.   AXIS III:  1. Cirrhosis.  2. Hypothyroidism.   AXIS IV:  Moderate.   AXIS V:  Global Assessment of Functioning upon discharge 50-55.   DISCHARGE MEDICATIONS:  1. K-Dur 20 mEq daily.  2. Lasix 40 mg daily.  3. Aldactone 100 mg twice a day.  4. Vioxx 25 mg daily.  5. Synthroid 50 mcg daily.  6. Seroquel 100 mg, 1 twice a day and 2 at bedtime.  7. Lotrimin cream to lesion.  8.     Lactulose 30 cc twice a day.  9. Albuterol 2 puffs p.r.n.   FOLLOW UP:  [facility].                                               Geoffery Lyons, M.D.    IL/MEDQ  D:  11/27/2002  T:  11/27/2002  Job:  [id number]

## 2011-05-13 NOTE — Discharge Summary (Signed)
[name], [name]                        ACCOUNT NO.:  [id number]   MEDICAL RECORD NO.:  [id number]                   PATIENT TYPE:  INP   LOCATION:  5526                                 FACILITY:  [facility]   PHYSICIAN:  Eliseo Gum, M.D.                DATE OF BIRTH:  02/09/1941   DATE OF ADMISSION:  01/09/2003  DATE OF DISCHARGE:  01/13/2003                                 DISCHARGE SUMMARY   DISCHARGE DIAGNOSES:  1. Cerebrovascular event status post left parietal, occipital, temporal     watershed infarct.  2. Hypercarbic ventilator dependent respiratory failure.  3. Right lower lobe pleural effusion.  4. Right hepatic lobe density.  5. Alcoholic cirrhosis with complications.  6. Acute pancreatitis.  7. Hypothyroidism.  8. Cellulitis.  9. Ileus.  10.      Malnutrition.  11.      Chronic obstructive pulmonary disease.  12.      History of psychiatric hospitalization.   DISCHARGE MEDICATIONS:  1. Albuterol inhaler two puffs q.i.d. p.r.n.  2. Atrovent inhaler two puffs q.i.d. p.r.n.  3. Pulmicort two puffs every day  4. Aspirin 325 mg p.o. every day  5. Levothyroxine 150 micrograms p.o. every day  6. Lasix 40 mg p.o. every day  7. Aldactone 100 mg p.o. q.a.m. and 50 mg p.o. q.p.m.  8. Senokot S, two tablets p.o. every day p.r.n.  9. Dulcolax suppository p.r.n.   FOLLOWUP APPOINTMENT:  The patient will follow up in the nursing home with  the nursing home physicians.  Issues to followup with include possible  repeat chest x-ray in about 1-2 weeks to evaluate the pleural effusions.  The patient should have a BMET checked as she has increased her dose of  Lasix and Aldactone recently, and her potassium should be monitored.  A  pulse oximetry should also be obtained with and possibly also without  activity.  A repeat EKG should be obtained as the patient has an abnormal  EKG while in the hospital.  She also had a low calcium upon admission.   On physical examination,  the things to look for are crackles in the lung  bases.  As noted above, the patient had pleural effusions while in the  hospital. also any evidence of abdominal pain, because of her acute episode  of pancreatitis.  Finally, a TSH should be obtained to evaluate the  Synthroid therapeutic properties.   PROCEDURES:  1. Endotracheal intubation 01/09/03.  2. Head CT 01/09/03 showed a subacute infarct in the left parietal to     occipital, large head area.  3. Right upper quadrant ultrasound on 01/10/03 showed minimal right lower     quadrant fluid.  4. Carotid Doppler on 01/10/03 showed no ICA stenosis, but question of more     distal obstruction in her vertebral artery.  5. A 2-D echocardiogram on 01/10/03 showed ejection fraction of 55-65% and  mild dilation of the left atrium.  6. Ultrasound data, right thoracentesis on 01/17/03 showed transudative     effusion.  7. Chest/ abdominal CT on 01/30/03 showed a tiny right effusion, bibasilar     atelectasis and mild pancreatitis with gallstones, hepatic cirrhosis and     a right liver lobe mass measuring approximately 2 cm.  8. MRI/MRA on 01/23/03 showed subacute left MC infarct, major MCA branch     occlusion, atherosclerotic changes of the distal ICA, left side greater     than right.  9. Chest, abdominal CT on 01/12/03 showed ileus involving the large and small     bowel.   CONSULTATIONS:  1. Neurology, Dr. Lesia Sago.  2. GI, Dr. Stan Head.  3. Psychiatry, Dr. Jeanie Sewer.  4. Psychology, Dr. Leonides Cave.   HISTORY OF PRESENT ILLNESS:  The patient is a 70 year old white female with  a history of alcohol cirrhosis with complications of portal hypertension,  ascites, end-stage lung disease, encephalopathy, coagulopathy, who presents  with lethargy, confusion and dyspnea who was brought to the emergency  department.  She was admitted to the ICU service, and admitted by me  secondary to hypercarbic respiratory failure, and airway protection.   The  patient last used alcohol four days ago and also had benzodiazepines in her  system.  The family said her speech had been garbled, and that she had been  too weak to support herself.   ALLERGIES:  No known drug allergies.   PHYSICAL EXAMINATION:  VITAL SIGNS:  On admission, temperature 97.0, blood  pressure 120/76, pulse 99, respirations 28, saturations 97% on 100% FIO2.  GENERAL:  The patient was lethargic with increased work of breathing.  LUNGS:  Poor air movement bilaterally.  HEART:  Regular rate and rhythm.  Distant heart sounds.  ABDOMEN:  Distended, nontender.  Decreased bowel sounds.  EXTREMITIES:  With trace edema bilaterally.  They were plethoric and  cyanotic.  NEUROLOGIC:  The patient was able to follow commands, and able to move all  extremities; however, she had an expressive aphasia with garbled speech and  agnosia.   ADMISSION LABORATORY DATA:  ABGs:  pH 7.13, PCO2 74.8, PO2 44.  Carboxyhemoglobin 2.5, methemoglobin hemoglobin 0.8.  White count 10.6,  hemoglobin 12.0, platelets 173, RDW 17.  ESR 2.  PT 18.7, INR 1.7.  PTT 29.  Sodium 145, potassium 3.6, chloride 119, bicarbonate 24, glucose 100, BUN  16, creatinine 0.7, calcium 5.2, total protein 4.5, albumin 1.7.  AST 24,  ALT 12, alkaline phosphatase 46, T-bili 0.6.  Magnesium 2.0, phosphorus 5.7,  amylase 42, lipase 21, ammonia 45.  CK 34, CK-MB 1.8, troponin 0.01.  BNP  1530.  Cholesterol 88, Triglycerides 86.  T4 is 4.0,  TSH 10.411.  Creatinine 4.  Benzodiazepine in her drug screen were positive.  Blood cultures, urine cultures, sputum cultures, were all negative.   HOSPITAL COURSE:  #1 - CEREBROVASCULAR ACCIDENT:  The patient came in with  lethargy and confusion.  A head CT was obtained which showed a subacute  infarct in the left parietal occipital temporal watershed region.  The  patient was started on aspirin and neurology was consulted.  The patient continued to received physical therapy,  occupational therapy, and speech  therapy throughout her hospitalization.  She had an expressive aphasia with  agnosia that was quite severe.  She was first diagnosed with a stroke.  Over  the course of her hospitalization, this dramatically improved.  She  continued to receive  her physical therapy, speech therapy and occupational  therapy.  The patient is currently just on aspirin for further stroke  prophylaxis.  She will probably need the addition of Plavix.  As of today,  her expressive aphasia still remains.  However, it is much improved since  admission.  She will likely need a few more weeks of speech therapy.   #2 - HYPERCARBIC, VENTILATOR DEPENDENT, RESPIRATORY FAILURE:  The patient, as noted above, came into the ED in respiratory acidosis.  She  was intubated for hypercarbic respiratory failure and airway protection  since she was obtunded upon admission.  She was initially treated for  questionable pneumonia which was diagnosed because of a right lower lobe  infiltrate seen on chest x-ray.  She was given 10 days of Zosyn and a total  of two days of vancomycin.  The patient remained intubated for 10 days, and  was extubated without problems.  She did require high amounts of oxygen at  first.  However, after receiving taps of the fluid in her lungs, her  respiratory status improved.  We also did aggressive physical therapy in  order to improve her pulmonary toilet.  Upon discharge, the patient was on  room air, saturating approximately 94%.   #3.  RIGHT HEPATIC LOBE DENSITY:  The patient had an abdominal CT when she  began having acute abdominal pain.  A 2 cm right hepatic lobe mass was seen  on the CT.  The patient will need follow up of this mass with an MRI in the  future.  She is at risk for hepatocellular carcinoma given her history of  alcoholic cirrhosis.   #4.  ALCOHOLIC CIRRHOSIS WITH COMPLICATIONS:  The patient has had multiple  complications from her alcoholic  cirrhosis including portal hypertension,  encephalopathy, ascites.  She was placed on Aldactone initially at 50 mg  p.o. every day but was ultimately titrated up to 100 mg p.o. every day with  50 mg at night of Aldactone.  We also gave her 40 mg of Lasix in order to  help with her ascites.  Because her blood pressure tends to run somewhat  low, we did not push the Lasix any further.  When she is seen in followup,  she will need to have potassium checked because she is on Lasix.  We  attempted to retry using the beta-blocker for portal hypertension; however,  her blood pressure did not tolerated it.  Consider using another medicine  such as an ARB for prevention of complications from her portal hypertension  such as esophageal varices.   #5 - ACUTE PANCREATITIS:  Several days into hospitalization, the patient  began having abdominal pain.  We obtained amylase and lipase, both of which  were quite elevated in the 1000's.  We made the patient NPO and gave her pain medicine for control, and kept her NPO for several days.  We obtained a  chest CT looking for the source of the pancreatitis, and the chest CT did  show multiple gallstones.  However, we did not see any stones when we  obtained a right upper-quadrant ultrasound as followup.  The only other  potential sources of her pancreatitis were question of sepsis for which we  started Zosyn for approximately three days.  However, it was decided that  she probably was not septic given absence of fevers, and the elevation of  the white count.  Another consideration as the source of her pancreatitis  was the start of Lasix  which she was given for pleural effusions.  This is  not a common cause of pancreatitis -- she developed no further episodes of  pancreatitis when the Lasix was given again.  We are therefore attributing  the etiology to the gallstones.   #6 - HYPOTHYROIDISM:  The patient came in with a TSH greater than 10, and T4  is also  noted above.  The patient's Synthroid was increased.  She was kept  on 150 micrograms a day.  This should be checked again as an outpatient in  order to assure than the Synthroid is indeed therapeutic.   #7 - Cellulitis:  The patient was documented to have had cellulitis in the  early part of her admission.  It was determined that the Zosyn would treat  both her possible pneumonia and cellulitis.  She does continue to have an  erythematous abdominal scaling area on a large portion of her back on both  sides as well as various similar areas over her trunk, legs and arms.  These  areas appear to be healing very well.  The patient is currently not on  antibiotics for this.   #8 - Ileus:  The patient was found to have an ileus during this  hospitalization.  A GI consultation was obtained which is in a separate  report.  The patient had an NG tube placed, and when her ileus resolved, the  NG tube was discontinued.  She current is tolerating fluids very well, and  is receiving p.r.n. suppositories, and Senokot daily for constipation.  She  has had no further episodes of ileus since her NG tube was discontinued.  She is encouraged to walk around, and has been doing so for several days on  this admission.   #9 - Malnutrition.  The patient had a significant history of alcohol abuse  before she came into the hospital.  Her albumin was only 1.7 and she had a  prealbumin of 4 indicating that she probably was malnourished.  Of course,  the albumin indicative of poor synthetic liver function.  The patient has  had a graft placed since she has been in the hospital, and hopefully will  continue to eat when discharged in the absence of using alcohol.  The  patient will be sent home with a multivitamin 800 to aid in this issue.   #10 - CHRONIC OBSTRUCTIVE PULMONARY DISEASE AS NOTED ABOVE:  The patient  came in hypercarbic respiratory failure.  It was unknown whether this was a  combination of her COPD,  plus her decreased level of consciousness from the stroke, +/- benzodiazepines and alcohol use.  The patient has been on  albuterol, Atrovent and Pulmicort nebulizations throughout the  hospitalization, and also has been on inhalers before discharge.  On  followup, the patient should have a lung examination performed in order to  assess for any wheezes or signs of COPD exacerbation.  As noted, she is  currently on room air and saturating between 92 and 94%.   #11 - HISTORY OF PSYCHIATRIC HOSPITALIZATION:  The patient apparently had an  issue in 2023/10/18 after her husband died in which she became acutely  psychotic and had inappropriate behavior.  However, she currently has an  expressive aphasia.  She has been appropriate throughout her  hospitalization.  The patient will be going to a skilled nursing facility in  order to assist in regaining strength, and the ability to speak and  communicate.  We did obtain records from  [facility] explaining the  events of her previous admission.  We did have  Dr. Unknown Foley talk to the patient in order to assess for competence.  However,  it was difficult for him to do so with her severe expressive aphasia.  He  recommended talking to Dr. Leonides Cave in order to do neuro psychometric testing  on the patient.  Dr. Leonides Cave also evaluated the patient and decided that she  probably was not appropriate for this testing with her expressive aphasia.  When she is able to speak more clearly, she may indeed be eligible for this  test by Dr. Leonides Cave, and if so, she will be set up to do so.   DISCHARGE LABORATORIES:  White count 11.1, hemoglobin 15.6, platelets 191.  Sodium 131, potassium 3.6, chloride 95, CO2 25, BUN 16, creatinine 0.9,  glucose 160.  AST 23, ALT 20, alkaline phosphatase 70, T-bili 0.7, amylase  56, lipase 30, calcium 8.9, TSH 2.431.                                               Eliseo Gum, M.D.    KC/MEDQ  D:  02/12/2003  T:  02/13/2003   Job:  [id number]

## 2011-05-13 NOTE — Discharge Summary (Signed)
[name], [name]            ACCOUNT NO.:  [id number]   MEDICAL RECORD NO.:  [id number]          PATIENT TYPE:  INP   LOCATION:  4734                         FACILITY:  [facility]   PHYSICIAN:  Alvester Morin, M.D.  DATE OF BIRTH:  Jul 18, 1941   DATE OF ADMISSION:  10/01/2005  DATE OF DISCHARGE:  10/03/2005                                 DISCHARGE SUMMARY   DISCHARGE DIAGNOSES:  1.  Pancreatitis.  2.  Chest pain.  3.  Cirrhosis.  4.  Chronic obstructive pulmonary disease.  5.  Status post cerebrovascular accident with Broca's aphasia.  She had a      cerebrovascular accident in January 2003, and in the summer of 2006.  6.  Bipolar disorder.  7.  Hypothyroidism.  8.  Tobacco abuse (40 pack year history, current smoker one pack per day).  9.  History of alcohol abuse, one to two drinks per week now, difficulty      quantitating actual consumption.  10. Skin cancer (lymphoma).   DISCHARGE MEDICATIONS:  1.  Albuterol meter dosed inhaler one puff q.i.d. p.r.n. shortness of      breath.  2.  Divalproex 500 mg one pill at bedtime.  3.  Lasix 40 mg one pill once a day in the morning.  4.  Atrovent 0.5 mg one puff q.i.d.  5.  Lactulose 30 mL once a day in the morning.  6.  Levothyroxine 15 mcg one pill daily.  7.  Spironolactone 100 mg one pill once a day in the morning.  8.  Lipitor 10 mg one pill daily.  9.  Nicotine patch 21 mg one per day transdermal.   PROCEDURES:  1.  CT scan of the abdomen and pelvis and chest without contrast medium      performed on October 01, 2005, showed negative for pulmonary emboli, no      renal calculi, no hydronephrosis, gallstones are present, calcifications      at the head of the pancreas which could represent distal common bile      duct calculus with no evidence of dilatation of the common bile duct,      this may represent calcification of calcific pancreatitis.  Correlate      with LFT's before appendix appears normal, and there has been a  negative      CT scan of the pelvis for acute abnormality.  2.  On October 03, 2005, she had a nuclear medicine myocardial imaging with      _____________.  That showed normal Adenosine Myoview with no diagnostic      electrographic changes.  The senographic results showed no evidence of      ischemia or infarction in any vascular territories.  Ejection fraction      was 85%, and the wall motion was normal.   CONSULTATIONS:  Doylene Canning. [name], M.D., for evaluation of chest pain.  The  cardiologist, Dr. [name], felt that it was necessary to proceed with an  Adenosine stress test, but he felt like she was a poor candidate for surgery  secondary to her multiple comorbidities.  Felt that  catheterization would be  warranted only if she had very high risk stress test which she did not, so  she did not have a catheterization during this hospitalization.   HISTORY OF PRESENT ILLNESS:  For full H&P, please consult the admission H&P,  but in brief, [name] is a very pleasant 70 year old white female with a  history of severe COPD, bipolar disorder, and cirrhosis who presented to the  ED with left arm pain and chest pain.  She states that it began about 20  hours ago and her left arm pain she describes as an ache in her left upper  arm radiating to her shoulder, and it was about 11:00 p.m. when she started  to notice it.  In the morning, she began to feel some substernal chest pain  also described as aching.  She said she did have nausea, but no vomiting  associated, no diaphoresis, no shortness of breath, no lightheadedness.  The  pain persisted all day, so she came to the ER.  At the time of presentation,  she denied any chest pain, but she received morphine and nitroglycerin prior  to transfer from another hospital.  She denied fever, dysuria, diarrhea, or  bright red blood per rectum.  She also has had skin cancer (lymphoma, and is  getting UV treatment).   PHYSICAL EXAMINATION:  VITAL SIGNS:   Temperature 98.1, blood pressure  122/86, pulse 70, respirations 22, O2 saturation 96% on 3 L.  GENERAL:  She is in no acute distress.  Obese white female.  HEENT:  Mild scleral icterus.  Pupils equal, round, reactive to light and  accommodation.  Poor dentition.  Mucous membranes are moist.  Oropharynx  clear.  NECK:  No thyromegaly.  RESPIRATORY:  She was in no respiratory distress.  Mild diffuse expiratory  wheezing.  Moderate air movement.  HEART:  Her heart sounds were distant, no murmurs, rubs, or gallops were  appreciated.  ABDOMEN:  Bowel sounds were present.  She is obese.  She had caput medusa  present.  Mild epigastric tenderness to palpation, no rebound or guarding.  EXTREMITIES:  2+ pitting edema bilaterally, 2+ dorsalis pedis pulses.  SKIN:  Spider hemangioma were present on her right cheek.  A large scaly  erythematous plaque was on her left lower quadrant of her abdomen.  She also  had a scaly rash on her extremities, especially her legs bilaterally.   LABORATORY DATA:  UA showed large hemoglobin, small bilirubin, trace  leukocyte esterase, 11 to 20 white blood cells.  Myoglobin was 121 and 94,  MB was 2 and 1.4, troponin's were both times less than 0.05.  Lipase was  358.  Her BMET:  Sodium 135, potassium 3.9, chloride 100, bicarbonate 25,  BUN 21, creatinine 1, and glucose 138.  Hemoglobin 14.6, white count 13.3,  platelets 166, MCV 98, ANC 0.7.  CT scan of her chest was negative for PE.  CT scan of the abdomen and pelvis showed gallstones and something in the  head of her pancreas which could represent a distal CBD calculus, but there  is no dilatation of CBD, possibly due to calcific pancreatitis.  Anion gap  10, bilirubin 0.6, alkaline phosphatase 64, AST 17, ALT 14, protein 7,  albumin 3.4, calcium 8.8.   HOSPITAL COURSE:  #1 -  CHEST PAIN:  For her chest pain, it was thought to be secondary to her pancreatitis, but she was ruled out for acute MI with  serial  cardiac enzymes  and was placed on telemetry.  A cardiac consult was  called and Adenosine Myoview was performed and found to be normal.  Later  on, the patient felt that she might of had some heartburn that might have  accounted for the chest pain.  She also was later on said that her arm had  been hurting because of the way she slept on it, and she thought that maybe  that was why it was hurting.  Please note that the patient does have an  expressive aphasia and responds to leading questions in an affirmative  fashion.  I wonder if on her initial interview if she was responding to the  questions in an agreeing way that made it more consistent with a cardiac  type picture than with actually what she might have expressed had she not  had Broca's aphasia.  She was chest pain free at the time of discharge.   #2 -  PANCREATITIS:  The patient when she presented was pain-free.  She was  given clear liquids.  She was able to tolerate those and was advanced.  She  does say that she drinks a little bit of alcohol occasionally, and  gallstones were seen on the CT scan, so it could be gallstone pancreatitis  or alcoholic pancreatitis.  During the course of her admission, her lipase  was 358, then 166, then fell to 76, so it did drop.  We checked her fasting  lipid panel.  Cholesterol was 171, LDL 113, triglycerides 135, and HDL 31.  There was that calcification at the head of the pancreas that we saw.  We  have set her up as an outpatient to have that evaluated because she was not  complaining of any problems.  Unfortunately, she has an Marine scientist at  [employer], so instead of being able to set her up for an appointment, we had  to give her the number and have her call so she could settle things with  them.  She was pain-free on discharge.   #3 -  BIPOLAR DISORDER:  Stable.  We continued her Depakote throughout her  hospitalization and without any problems.   #4 -  HYPOTHYROIDISM:  We gave her  15 mcg of Synthroid, and rechecked a TSH  which was 1.992, so we just kept her on her regular dose of Synthroid.   #5 -  CHRONIC OBSTRUCTIVE PULMONARY DISEASE:  She was given albuterol and  Atrovent nebulizers q.i.d., and since she has a DuoNeb at home, and  maintains her oxygenation on 4 L, she did fine.  She is on home O2.  The  patient has oxygen-dependency, 3 to 4 L per minute at home.   #6 -  TOBACCO ABUSE:  She received a tobacco cessation consult and the  patient said she would never quit and did not want to quit either.  She  refused any of the educational information.   #7 -  CIRRHOSIS:  Her cirrhosis was stable.  Her liver enzymes were within  normal limits.  Her albumin was slightly decreased, although it could be a  result of poor nutrition and p.o. intake.   DISCHARGE PHYSICAL EXAMINATION:  VITAL SIGNS:  On the day of discharge, her temperature was 98.2, she appeared well, said she was back to normal.  No  chest pain, no nausea, vomiting, no leg pain, no headache.  Her blood  pressure was 96 to 110/62 to 72, pulse 69 to 91, respirations 20.  She  was  saturating 90 to 95% on room air to 4 L.  GENERAL:  She is in no acute distress.  She did have difficulty finding  words.  HEART:  Her heart sounds were distant, but she is slightly tachycardic  because she just had her Adenosine Myoview test.  No murmurs, rubs, or  gallops were appreciated.  By the time of discharge, however, she did have a  normal rate and a soft S1 and S2.  LUNGS:  Clear to auscultation.  ABDOMEN:  Protuberant, nontender, nondistended, positive bowel sounds.  EXTREMITIES:  No cyanosis, clubbing, or edema.  No calf tenderness.  She had  a dry scaly rash, but she says she has cancer and is getting lights.  We  questioned lymphoma and ___________with that, but she was unable to tell us  what it was.  She does have problems naming things.  NEUROLOGIC:  Cranial nerves II-XII intact.   LABORATORY DATA:   Hemoglobin 13.1, hematocrit 38.3, white count 8.4, which  is down from 13.3, platelets 145, which was down from 166.  Sodium 138,  potassium 3.4, and we gave her 40 mEq of K-Dur.  Chloride 103, bicarbonate  28, BUN 9, creatinine 0.9, glucose 123.  Lipase 67.  Negative cardiac  enzymes.   There are no labs pending at the time of discharge.      Clearance Coots, M.D.      Alvester Morin, M.D.  Electronically Signed    IN/MEDQ  D:  10/07/2005  T:  10/08/2005  Job:  [id number]   cc:   Dr. Allena Katz  [facility]

## 2011-05-13 NOTE — Consult Note (Signed)
[name], [name]            ACCOUNT NO.:  [id number]   MEDICAL RECORD NO.:  [id number]          PATIENT TYPE:  INP   LOCATION:  4734                         FACILITY:  [facility]   PHYSICIAN:  Doylene Canning. [name], M.D.  DATE OF BIRTH:  1941/11/22   DATE OF CONSULTATION:  10/02/2005  DATE OF DISCHARGE:                                   CONSULTATION   REFERRING PHYSICIAN:  [facility] and Dr. Andrey Campanile.   REASON FOR CONSULTATION:  Evaluation of chest pain in a patient with  multiple cardiac risk factors.   HISTORY OF PRESENT ILLNESS:  The patient is a 70 year old woman who was  admitted to the hospital with chest pain and found to have elevated lipase  and clinical pancreatitis. Her symptoms and her elevated lipase have  improved. Initial serial cardiac enzymes were negative for coronary ischemia  or myocardial injury. Evaluation with CT scan demonstrated gallstones in the  gallbladder. There was no ductal dilatation noted. The thinking was that the  patient had a gallstone which had obstructed her common bile duct resulting  in pancreatitis but this has passed based on the lack of ductal dilatation,  improving pancreatic enzymes, and improving symptoms.   PAST MEDICAL HISTORY:  1.  Notable for COPD on home oxygen.  2.  She had a history of end-stage liver disease with anasarca, cirrhosis      and portal hypertension. This was thought secondary to longstanding      alcohol abuse.  3.  She has a history of thyroid dysfunction and a history of congestive      heart failure.  4.  History of bipolar disorder.  5.  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   SOCIAL HISTORY:  The patient lives in [place]. She has ongoing tobacco  use and has a 50-pack-year smoking history. She initially quit in December  of 2003 but has restarted her smoking use. She denies alcohol use.   FAMILY HISTORY:  Noncontributory.   REVIEW OF SYSTEMS:  Negative for  fevers, chills, or night sweats, vision or  hearing problems. She denies headache or photophobia, cough or hemoptysis.  She denies skin lesions but does have easy bruising. She had chest pain as  previously noted which was in the midportion of the chest and this pain  radiated to the left upper arm. Nausea was present but there was no obvious  vomiting, diaphoresis, or shortness of breath. She has had no syncope. A  combination of morphine and nitroglycerin improved the pain. Her other  review of systems was notable for diarrhea. She also has chronic lower  extremity swelling. She admits to some anxiety. She denies dysuria,  hematuria, or nocturia. The rest of her review of systems was negative.   PHYSICAL EXAMINATION:  GENERAL:  She is a pleasant, obese, middle-aged woman  in no distress.  VITAL SIGNS:  Blood pressure was 100/62, pulse 90 and regular, respirations  were 18, and the weight was not recorded. Temperature was 98.  HEENT:  Normocephalic and atraumatic. Pupils are equal and round. The  oropharynx was  moist. Sclerae were anicteric.  NECK:  No jugular venous distention. There was no thyromegaly. Trachea was  midline.  LUNGS:  Clear bilaterally to auscultation except for decreased breath sounds  bilaterally in the bases. Overall, there were decreased lung sounds  throughout.  CARDIOVASCULAR:  Distant with a regular rate and rhythm. Normal S1 and S2.  ABDOMEN:  Obese, nontender, nondistended. There was no obvious organomegaly.  The bowel sounds were present. She was not tender to palpation.  EXTREMITIES:  There was 1+ peripheral edema bilaterally. Pulses were 1+ and  symmetric throughout.  NEUROLOGICAL:  Notable for a fine intension tremor. Otherwise cranial nerves  II through XII intact. Strength was 4+/5 and symmetric and the patient was  alert and oriented to person, place, and date.   The EKG demonstrates sinus rhythm of low voltage but otherwise normal.   IMPRESSION:   1.  Atypical chest pain in the setting of acute pancreatitis.  2.  Cirrhosis with end-stage liver disease.  3.  Severe chronic obstructive pulmonary disease.   DISPOSITION:  At this point, proceeding with Adenosine stress test would be  reasonable; however, I think the patient would be a poor candidate for any  surgery secondary to her multiple comorbidities. Catheterization will be  warranted only if the patient had a very high risk stress test.           ______________________________  Doylene Canning. [name], M.D.     GWT/MEDQ  D:  10/02/2005  T:  10/03/2005  Job:  [id number]

## 2011-05-13 NOTE — H&P (Signed)
[name], [name]                        ACCOUNT NO.:  [id number]   MEDICAL RECORD NO.:  [id number]                   PATIENT TYPE:  IPS   LOCATION:  0403                                 [facility]   PHYSICIAN:  Geoffery Lyons, M.D.                   DATE OF BIRTH:  [birth year]   DATE OF ADMISSION:  10/18/2002  DATE OF DISCHARGE:                         PSYCHIATRIC ADMISSION ASSESSMENT   IDENTIFYING INFORMATION:  A 70 year old widowed white female involuntarily  committed on October 18, 2002,   HISTORY OF PRESENT ILLNESS:  The patient presents with a history of  psychosis.  The patient was petitioned per her caretaker.  Papers report the  patient is terminally ill with cirrhosis of the liver.  The patient recently  has lost her husband and _____________claiming to be God.  Feels that the  patient was a danger to herself, afraid that people will take advantage of  her.  The patient is not sure why she has been admitted but she is mad at  her caretaker for committing her.  Denies any psychotic symptoms, auditory  or visual hallucinations, denies any suicidal or homicidal ideation, denies  any depression or anxiety.  The patient then states she is glad to be here.   PAST PSYCHIATRIC HISTORY:  First hospitalization to [facility], no apparent other psychiatric history.   SOCIAL HISTORY:  A 69 year old widowed white female, married for 41 years.  Husband passed away on 10/21/2002.  Has 3 children all grown, lives  alone, is on disability, has a caretaker named Kristine Garbe.   FAMILY HISTORY:  History of alcoholism.   ALCOHOL DRUG HISTORY:  Denies any alcohol or substance abuse.   PAST MEDICAL HISTORY:  Primary care Emre Stock is Dr. Tresa Endo Dallmer at  [facility].  Medical problems are cirrhosis of the liver and  hypothyroidism.   MEDICATIONS:  Potassium chloride 20 mEq every day, Lasix 40 mg q.d.,  Aldactone 100 mg b.i.d., Levoxyl 0.5 1/2 tab q.d., Lactulose  2 tablespoons  b.i.d., Glucophage 500 mg b.i.d. and Vioxx 25 mg every day.   DRUG ALLERGIES:  No known allergies.   PHYSICAL EXAMINATION:  VITAL SIGNS:       The patient is 96 temperature, 108  heart rate, 20 respirations.  Blood pressure is 110/80.  Her blood sugar  this morning is 141.  The patient is 5 feet 4 inches tall, she is 278  pounds.  The patient is morbidly obese.  She is alert and oriented.  She is  in no acute distress.  LUNGS:  ______________ breath sounds, no adventitious sounds.  HEART RATE:  Regular rate and rhythm, no murmurs, gallops or rubs.  ABDOMEN:  Soft, obese abdomen with positive bowel sounds.   LABORATORY DATA:  Alcohol level was less than 5.  Urine drug screen is  negative.  Glucose is 173 per blood draw.  Her blood sugar this  morning was  141.  Ammonium level is 42.  RBCs is 5.12.   MENTAL STATUS EXAM:  She is an alert, morbidly obese, unkempt female,  cooperative, with good eye contact.  Speech is clear.  The patient is very  pleasant and talkative.  Thought processes some positive delusions,  questionable paranoia.  No suicidal or homicidal ideation, no auditory or  visual hallucinations.  Cognitively she is oriented x3.  Her memory is good.  Judgment and insight are poor.   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  2. Rule out major depression with psychotic features.   AXIS II:  Deferred.   AXIS III:  Cirrhosis of the liver, hypothyroidism, non-insulin-dependent  diabetes mellitus.   AXIS IV:  Problems with primary support group, grief issues, other  psychosocial problems, medical problems.   AXIS V:  Current is 25, this past year 60-65.   PLAN:  Involuntary commitment for psychosis.  Contract for safety, check  every 15 minutes.  Will have the patient on the 400 hall for close  monitoring.  Will obtain labs, resume her medications.  Will check her blood  sugar.  Will contact family and caretaker for background  information.  Will  stabilize her mood and thinking so the patient can be safe.  Will consider  internal medicine consult for the patient's complex medical problems.  Will  consider placement for the patient, discuss that as the patient progresses.  The patient to be medication compliant.   TENTATIVE LENGTH OF CARE:  6 days or more depending on the patient's  response to medications and consideration of placement.       Landry Corporal, N.P.                       Geoffery Lyons, M.D.    JO/MEDQ  D:  10/28/2002  T:  10/28/2002  Job:  [id number]

## 2011-05-13 NOTE — H&P (Signed)
[name], [name]                        ACCOUNT NO.:  [id number]   MEDICAL RECORD NO.:  [id number]                   PATIENT TYPE:  IPS   LOCATION:  0400                                 [facility]   PHYSICIAN:  Landry Corporal, N.P.                 DATE OF BIRTH:  1941/02/08   DATE OF ADMISSION:  11/30/2002  DATE OF DISCHARGE:  12/04/2002                         PSYCHIATRIC ADMISSION ASSESSMENT   IDENTIFYING INFORMATION:  70 year old, widowed, white female voluntarily  admitted for manic behavior on 11/30/02.   HISTORY OF PRESENT ILLNESS:  Patient presents with history of manic/bizarre  behavior.  Patient had taken herself to the police department looking for  sex.  Police had brought patient to Emergency Department for evaluation.  Patient states that she was glad to be back to [facility] for the  food.  Denies any specific stressors.  She states she has been drinking some  alcohol, has been noncompliant with the medication.  Denies any suicidal or  homicidal ideation or psychotic symptoms.   PAST PSYCHIATRIC HISTORY:  Second admission to [facility].  She was  here in November 04, 2003for delusional thinking.   SOCIAL HISTORY:  This is a 70 year old, widowed, white female.  Husband died  in 2002/10/29.  She was married for 41 years.  She is unemployed.  Has  a high school education.   FAMILY HISTORY:  History of alcohol abuse.   ALCOHOL, DRUG HISTORY:  No substance abuse.  She has been drinking recently.   PRIMARY CARE PHYSICIAN:  Dr. Madaline Guthrie in [place].   MEDICAL PROBLEMS:  1. Chronic obstructive pulmonary disease.  2. Cirrhosis  3. Marked obesity  4. Hypothyroidism   MEDICATIONS:  1. KCL 20 mg q.d.  2. Lasix 3 mg q.a.m.  3. Aldactone 100 mg b.i.d.  4. Synthroid 15 mcg q.a.m.  5. Vioxx 25 mg q.d.  6. Albuterol inhaler two puffs q.6h.   DRUG ALLERGIES:  No known drug allergies.   PHYSICAL EXAMINATION:  Was performed at [facility].  Patient is obese and  disheveled with reddish rash noted to her lower extremities.  Baseline pulse  ox is 86%.  Patient's respirations are easy.  Her skin color, otherwise, is  good.  Denied any complaints.  Pulse oximetry was 86% on admission with  vital signs 97.7, 100 heart rate, 22 respirations, blood pressure 135/81.   LABORATORY DATA:  CBC hemoglobin 15.3, hematocrit 46.6, albumin 3, ammonia  level is 45, alcohol level 152, urine drug screen negative.   MENTAL STATUS EXAM:  She is an older, middle-aged, markedly obese female.  She is in a gown.  Pleasant.  Speech is clear.  Patient feels good,  elevated.  Patient somewhat elevated and pleasant, talkative.  Thought  processes are coherent.  There is no evidence of psychosis.  Cognitively the  patient is intact.  Memory is fair.  Judgment and insight poor.  AXIS I:  1. 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  2. Alcohol abuse.   AXIS II:  Deferred.   AXIS III:  1. Cirrhosis.  2. Hypothyroidism.  3. Chronic obstructive pulmonary disease.  4. Marked obesity.   AXIS IV:  Severe with problems of primary support group lack of, and grief,  other sexual problems, medical problems.   AXIS V:  Current GAF 25.  This past year 28.   PLAN:  Voluntary admission for bizarre behavior.  Contract for safety.  Patient to be placed in 400 hall.  Patient considered fall risk.  Routine  labs.  Will resume her medications.  Have Librium available every six hours  for withdrawal symptoms.  Will monitor her blood sugars.  Will check her  pulse ox and have nebulizer treatments available.  Our plan is to stabilize  __________  so patient can be safe and functional.  Caseworker will look at  placement or current living arrangements.   TENTATIVE LENGTH OF STAY:  Six days or more, depending on patient's response  to medication and placement issues, if necessary.                                                Landry Corporal, N.P.     JO/MEDQ  D:  12/04/2002  T:  12/04/2002  Job:  [id number]

## 2011-05-13 NOTE — Discharge Summary (Signed)
NAMEHARRY, [name]                        ACCOUNT NO.:  [id number]   MEDICAL RECORD NO.:  [id number]                   PATIENT TYPE:  INP   LOCATION:  0383                                 [facility]ACILITY:  [facility]   PHYSICIAN:  Deirdre Peer. Polite, M.D.              DATE OF BIRTH:  29-Oct-1941   DATE OF ADMISSION:  12/26/2003  DATE OF DISCHARGE:                                 DISCHARGE SUMMARY   DISCHARGE DIAGNOSES:  1. Severe chronic obstructive pulmonary disease exacerbation, improved at     time of discharge, oxygen dependent.  2. Possible aspiration pneumonia.  3. Bipolar disorder.  4. Cerebrovascular accident with Broca aphasia.  5. Hypothyroidism.  6. History of alcohol abuse.  7. History of tobacco abuse.  8. Hypotension, resolved.   DISCHARGE MEDICATIONS:  1. Synthroid 100 mcg one daily.  2. Depakote 500 mg b.i.d.  3. Lactulose 30 mL daily.  4. Albuterol and Atrovent nebulizers q.6h.  5. Pulmicort nebulizer b.i.d.  6. Aldactone 50 mg daily.  7. Amoxicillin 875 mg b.i.d.  8. Humibid LA 600 mg b.i.d.  9. Aspirin 325 mg daily.  10.      Risperdal 1 mg b.i.d.  11.      Ambien 10 mg one-half tablet daily.   CONSULTANTS:  Dr. Sung Amabile, pulmonary, critical care medicine.   STUDIES:  Chest x-ray on January 04, 2004 shows marked improvement in right  base aeration, persistent small amount of atelectasis in left base with  small left pleural effusion.  Chest x-ray December 29, 2003:  Worsening  bibasilar atelectasis, left greater than the right, with small bilateral  pleural effusion.  Chest x-ray January 2:  Worsening vascular congestion,  fluid overload, stable left lower lobe atelectasis and left effusion,  worsening right basilar atelectasis.  ABG on January 1 shows pH 7.3, PCO2 of  63.1, PO2 of 46.8.  ABG on January 4 shows pH 7.4, PCO2 of 56.5, PO2 of  56.1.  CBC on admission within normal limits.  CBC at discharge within  normal limits.  BUN on admission within normal limits.   Ammonia level 55.  Cortisol level 15.8.  TSH 0.579.  Blood culture negative.   HISTORY OF PRESENT ILLNESS:  [name] presented to [facility] ED with  acute onset of shortness of breath, chest congestion, and nonproductive  cough.  In the ED the patient was evaluated and had a chest x-ray which  showed bibasilar atelectasis left greater than right with small bilateral  pleural effusion, ABG consistent with hypercarbic hypoxic respiratory  failure.  Admission was deemed necessary for further evaluation and  treatment.   PAST MEDICAL HISTORY:  As stated above, COPD O2 dependent, bipolar disorder,  history of CVA with Broca aphasia, hypothyroidism, history of alcohol abuse,  tobacco abuse.   MEDICATIONS ON ADMISSION:  1. Synthroid 0.1 mg daily.  2. Aldactone 50 mg daily.  3. Aspirin 325 mg daily.  4. Risperdal  1 mg b.i.d.  5. Ambien 5 mg daily.  6. Depakote 500 mg b.i.d.   SOCIAL HISTORY:  The patient continues to smoke.  History of alcohol abuse.  However, reports none since December 2003.   FAMILY HISTORY:  Noncontributory.   HOSPITAL COURSE:  The patient was admitted to a telemetry floor bed for  evaluation and treatment of severe hypercarbic respiratory failure.  While  on the floor the patient had decompensation and required transfer to  intensive care unit for further monitoring.  The patient was continued on  broad-spectrum antibiotics and pulmonary toilet, frequent nebulizers, and  oxygen.  The patient was ultimately transferred out of the ICU to a medicine  floor bed where her hospital course was fairly stable except for a mild bout  of hypotension with improved with IV fluids and hetastarch infusion.  The  patient's blood cultures were negative and there were no other  complications.  The patient was ultimately deemed stable for discharge to  home on January 11.  She will require home O2 and frequent nebulizers.  The  patient has been informed to stop smoking and to  continue all medications as  outlined.  The patient's caregiver, Mr. Berline Lopes, has been informed to try to  avoid over sedating the patient with Ambien and other such sleeper-type  medications.  The patient has several other chronic medical problems for  which she will continue her current outpatient medicines.                                               Deirdre Peer. Polite, M.D.    RDP/MEDQ  D:  01/06/2004  T:  01/06/2004  Job:  [id number]

## 2011-05-13 NOTE — Discharge Summary (Signed)
[name], [name]                        ACCOUNT NO.:  [id number]   MEDICAL RECORD NO.:  [id number]                   PATIENT TYPE:  INP   LOCATION:  5735                                 FACILITY:  [facility]   PHYSICIAN:  Billey Gosling, M.D.                 DATE OF BIRTH:  1941-09-25   DATE OF ADMISSION:  07/30/2003  DATE OF DISCHARGE:  08/01/2003                                 DISCHARGE SUMMARY   DISCHARGE DIAGNOSES:  1. Weakness.  2. Status post cerebrovascular accident in January 2004.  3. Alcoholic cirrhosis.  4. Tobacco abuse.  5. Bipolar disorder.  6. Hypothyroidism.   PROCEDURES:  CT of the head.   DISCHARGE MEDICATIONS:  1. Temazepam 30 mg q.h.s.  2. Synthroid 100 mcg daily.  3. Aspirin 325 mg daily.  4. Spironolactone 50 mg daily.  5. Atrovent 0.5 mg inhale q.i.d.  6. Depakote ER 500 mg b.i.d.  7. Risperdal 3 mg b.i.d.   FOLLOW-UP:  The patient will follow up with Dr. Donell Beers on September 08, 2003 at 9:15 a.m. as well as Dr. Mila Homer on August 06, 2003 at 1:30 p.m.  She  will receive speech therapy with her first visit on Monday, August 04, 2003  at 10:30 a.m. and a case manager will set up physical therapy for the  patient as an outpatient.   ADMISSION HISTORY:  This 70 year old female status post CVA in January 2004  presented with a three-day history of weakness, difficulty with balance, and  dizziness, but denied any falls.  See dictated H&P for further details.   HOSPITAL COURSE:  #1 - WEAKNESS.  Throughout hospital course the patient had  some difficulty with word finding on neuro exam but no other focal  neurologic deficits.  A CT of the head was obtained which showed a left  temporoparietal subacute infarct but no acute bleed.  Her weakness was  thought to be secondary to recent medication change.  She was observed in  the hospital with no worrisome signs for acute CVA and was seen by speech  therapy while in the hospital, and will continue as an  outpatient along with  physical therapy for residual weakness and imbalance of her gait.   #2 - TOBACCO ABUSE.  The patient currently a smoker and was started on  albuterol and Atrovent while in the hospital.  Chest x-ray was obtained  which showed left basilar atelectasis but otherwise unremarkable.  Smoking  cessation should be done as an outpatient in this patient.   #3 - CIRRHOSIS.  Stable throughout hospitalization with no signs or symptoms  of ascites or encephalopathy.  Aldactone was continued.   #4 - HYPOTHYROIDISM.  The patient was continued on her Synthroid and a TSH  was not checked at this time.  May be beneficial to check as an outpatient  if not done recently, as the patient has this weakness and her Synthroid  may  need to be titrated.   #5 - BIPOLAR DISORDER.  Continued outpatient medications and she will follow  up with her psychiatrist.  She was stable throughout hospitalization.   LABORATORY DATA:  White blood count 6.7, hemoglobin 15.6, platelets 113, INR  1.1.  PT 13.7.  Sodium 144, potassium 4.5, chloride 102, bicarb 36, glucose  111, BUN 11, creatinine 0.8, calcium 9.1. total protein 6.5, albumin 3.1,  AST 13, ALT 10, alkaline phosphatase 41, total bilirubin 0.4.  Ammonia 47.  Valproic acid 82.7.  Urinalysis with 15 ketones, trace leukocyte esterase,  and 0-2 white blood cells.                                                Billey Gosling, M.D.    AS/MEDQ  D:  08/12/2003  T:  08/13/2003  Job:  [id number]   cc:   Dr. Westley Chandler at [facility]   Dr. Mila Homer   Dr. Donell Beers

## 2011-05-13 NOTE — Consult Note (Signed)
NAMEAUDA, FINFROCK NO.:  [id number]   MEDICAL RECORD NO.:  [id number]                   PATIENT TYPE:  INP   LOCATION:  2116                                 FACILITY:  [facility]   PHYSICIAN:  Marlan Palau, M.D.               DATE OF BIRTH:  [birth year]   DATE OF CONSULTATION:  01/21/2003  DATE OF DISCHARGE:                                   CONSULTATION   HISTORY OF PRESENT ILLNESS:  The patient is a 70 year old white female born  08-30-1941, with a history of alcohol abuse, cirrhosis of the liver  with hypoalbuminemia and a history of psychosis in the past. This patient  presented with a 3 to 4 day history of increasing lethargy, confusion,  dyspnea. The patient had garbled speech on admission and did eventually  require intubation with ventilator assistance during this hospitalization. A  CT scan of the brain done on admission showed evidence of a left parietal  occipital temporal watershed type stroke event.   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 The patient had unremarkable  carotid vessels.  A 2D echocardiogram showed evidence of ejection fraction  of 55% to 65%, mild elevation in left atrial size was noted.   The patient had recently been extubated. She remains confused, talking out  of her head. Due to ongoing confusion, neurology was asked to see this  patient for further evaluation.   PAST MEDICAL HISTORY:  Significant for:  1. 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  2. Toxic metabolic encephalopathy, confusion.  3. History of alcohol abuse.  4. Obesity.  5. Hypothyroidism.  6. COPD.  7. Cirrhosis of the liver, hypoalbuminemia, portal hypertension.  8. History of psychosis.  9. Diabetes.   ALLERGIES:  No known  allergies.   HABITS:  She has been  smoking 4 packs of cigarettes a week and had been  abusing alcohol prior to admission.   CURRENT MEDICATIONS:  1. Ventolin inhaler q.6h.  2. Atrovent inhaler q.6h.  3. Pulmicort q.6h.  4. Sliding scale insulin.  5. Aldactone  50 mg q.d.  6. Prevacid 30 mg q.d.  7. Synthroid 0.15 mg q.d.  8. Sucralfate.  9. Haldol p.r.n.  10.      Tylenol p.r.n.   SOCIAL HISTORY:  This patient is a widow. She lives a friend and currently  lives in the [place] area. Not employed.   FAMILY HISTORY:  Family medical history cannot be obtained at this time.   REVIEW OF SYSTEMS:  Very difficult to obtain, as the patient is confused,  knows her name, not clear on the place or date. She rambles on about  irrelevant topics. She does not answer questions directly.   PHYSICAL EXAMINATION:  VITAL SIGNS:  Blood pressure 100/50, heart  rate 66,  respiratory rate 26, temperature 99.7.  GENERAL:  This patient is a markedly obese white female who is alert but  confused at the time of examination.  HEENT:  Head is atraumatic. Eyes, pupils equally round and reactive to  light. Disks flat bilaterally.  NECK: Supple, no carotid bruits noted.  RESPIRATORY:  Relatively clear anterior lung fields.  CARDIOVASCULAR:  Distant heart sounds. No obvious murmurs or rubs noted.  EXTREMITIES:  Reveal 2 to 3+ pitting at the lower extremities bilaterally.  NEUROLOGIC:  Cranial nerves as above. The patient's sensory is relatively  intact. The patient has good sensory to pinprick sensation bilaterally.  Appears to have a relative right homonymous visual field deficit. Has full  extraocular movements, however. Although the speech is well enunciated, it  appears to be nonsensical at times.  Motor examination she appears to be able to move all 4s fairly well and no  asterixis is seen. The patient may have fair finger-nose-finger bilaterally.  She will not perform toe-to-finger. She seems to be ataxic with the use of  the lower  extremities. Deep tendon reflexes are depressed throughout. Toes  neutral bilaterally. The patient appears to respond to pinprick sensation  bilaterally. The patient was  not ambulated.   LABORATORY DATA:  Notable for sodium 141, potassium 3.6, chloride 99, CO2  36, glucose 145, BUN 14, creatinine 0.8. Calcium 8.3, total protein 5.9,  albumin 1.8. AST 22, ALT 13, alkaline phosphatase 64, magnesium 1.7, phos  4.1. White count 10.6, hemoglobin 14.0, hematocrit 43.3, MCV 89.6, platelets  157, cholesterol 88. Ammonia level done about 10 days ago was 47.   IMPRESSION:  1. Left parietal occipital temporal stroke event, likely watershed.  2. History of alcohol abuse.  3. Ventilator dependent, chronic obstructive pulmonary disease.  4. Diabetes.   This patient has multiple risk factors for stroke events. This patient  appears to have good motor strength, but seems to be confused. She could  potentially have a fluent type aphasia rather than true confusion, although  the patient has reasons for toxic metabolic encephalopathy given significant  liver dysfunction. The patient does appear to have a right homonymous visual  field deficit consistent with a stroke event. Carotid Doppler studies have  been performed and suggest the possibility of significant vertebral basilar  insufficiency. Will need to proceed with further workup at some point.   PLAN:  1. Would recommend an MRI angiogram of the posterior circulation when     possible.  2. Recheck ammonia level.  3. Aspirin therapy for now.  4.     Will follow the patient's course. May need to get speech therapy for speech      and language evaluation.  5. The patient may need to be sedated prior to the MI angiogram.  6. Will follow the patient's clinical course while in  house.                                                 Marlan Palau, M.D.   CKW/MEDQ  D:  01/21/2003  T:  01/21/2003  Job:  [id number]   cc:   Oley Balm. Sung Amabile, M.D.  [facility]  [address]  [place]  [place] [address]  Fax: [phone]   [facility]

## 2011-05-13 NOTE — Discharge Summary (Signed)
[name], DONAHEY                        ACCOUNT NO.:  [id number]   MEDICAL RECORD NO.:  [id number]                   PATIENT TYPE:  INP   LOCATION:  5702                                 FACILITY:  [facility]   PHYSICIAN:  Anastasio Auerbach, MD                    DATE OF BIRTH:  [birth year]   DATE OF ADMISSION:  08/31/2003  DATE OF DISCHARGE:  09/04/2003                                 DISCHARGE SUMMARY   DISCHARGE DIAGNOSES:  1. Chronic obstructive pulmonary disease exacerbation.  2. Oral candidiasis.  3. Bipolar disorder.  4. Obesity.   DISCHARGE MEDICATIONS:  1. Combivent two puffs q.i.d.  2. Flovent two puffs q.d.  3. Prednisone 60 mg p.o. q.d. for six days.  4. Continuous home oxygen at 2 liters.   The patient was told to resume all old medications being:  1. Synthroid 0.1 mg p.o. q.d.  2. Spironolactone 50 mg p.o. q.d.  3. Aspirin 325 mg p.o. q.d.  4. Risperdal 2 mg p.o. b.i.d.  5. Temazepam 30 mg p.o. q.h.s.  6. Depakote 500 mg p.o. q.a.m. and q.h.s.   DISPOSITION:  Home.   FOLLOW UP:  The patient was scheduled to followup with:  1. [facility] outpatient [facility] Tuesday October 5 at 10:30     a.m.  2. [facility] next on Thursday, September 16 at 3:50 p.m.   CONSULTANTS:  None.   PROCEDURE:  None.   BRIEF HISTORY OF PRESENT ILLNESS:  This is a 70 year old white female with  bipolar disorder, COPD, Broca's aphasia status post CVA, and cirrhosis  secondary to alcohol who presented to the ER because she developed a cold  with a new productive cough, increased shortness of breath, dyspnea on  exertion, chills for approximately five days with a positive sick contact.   HOSPITAL COURSE:  1. Chronic obstructive pulmonary disease exacerbation. The patient was     treated with five days of azithromycin considering cough, sputum, and     infiltrate on chest x-ray and was attempted to do pre and post nebulizer     treatment peak flows, but the patient  could not understand how to perform     the maneuver, and therefore not done after respiratory therapy attempted     a number of times. The patient was placed on q.4h. Atrovent and albuterol     nebulizer treatments on admission with q.2h. p.r.n. albuterol nebulizers     available. Once the patient was feeling symptomatically at baseline, the     patient was switched to handheld MDIs rather than nebulizer treatments.     Throughout hospitalization, the patient's O2 saturations were between 87     and 93% on 2 liters of O2. Without O2, the patient's saturations fell     below 88%. Therefore, she was sent home with home O2 to wear     continuously. The patient was started  on a 10-day steroid burst with 60     mg a day. She completed four days in house and given a prescription for     the last six days.  2. Bipolar disorder. The patient was stable throughout hospitalization on     her home regimen.  3. Oral candidiasis. The patient was given Nystatin swish and swallow which     resolved the candidiasis.  4. Thrombocytopenia. On admission, the patient's platelets were 130. They     were followed up throughout hospital admission and found to be stable     with platelet count of 123 at discharge.  5. Hypothyroidism. The patient was stable throughout hospitalization on her     home dose of Synthroid.  6. Atopic dermatitis. On admission, the patient complained of a flaky rash     on her back that had been there, she said, ever since having a stroke. We     considered seborrheic dermatitis versus contact dermatitis versus atopic     dermatitis and decided to treat with emollient cream which helped     tremendously. She will continue to use the Lubriderm at home.  7. Tobacco use. The patient was counseled while inpatient on smoking     cessation. She was given different options and choice to deny any help at     this time but states that she is thinking about it.                                                 Anastasio Auerbach, MD    AD/MEDQ  D:  09/15/2003  T:  09/16/2003  Job:  [id number]   cc:   Tresa Endo L. Philipp Deputy, M.D.  [phone] [address]  [place] [address]  Fax: [phone]

## 2011-05-13 NOTE — Op Note (Signed)
   [name], [name]                        ACCOUNT NO.:  [id number]   MEDICAL RECORD NO.:  [id number]                   PATIENT TYPE:  INP   LOCATION:  1824                                 FACILITY:  [facility]   PHYSICIAN:  Sheldon Silvan, M.D.                   DATE OF BIRTH:  1941-07-28   DATE OF PROCEDURE:  01/09/2003  DATE OF DISCHARGE:                                 OPERATIVE REPORT   PROCEDURE:  Endotracheal intubation.   I was called to the emergency department for assistance in managing the  airway of this woman.  She had been in the ED for several hours.  She came  in in a slightly comatose state.  She was quite obese and breathing on a  BiPAP assist device.  She was rapidly tiring.  She was unable to be aroused  by me verbally.  It was noted that her potassium was 3.6.  She was having  difficulty oxygenating herself adequately with saturations in the 80-90  range.   It was elected to intubate her.  She was ventilated using an Ambu bag and  mask with 100% oxygen.  Cricoid pressure was applied.  Intravenously,  Pentothal 250 mg, and succinyl choline 120 mg were given.  She was easily  ventilated after the onset of the succinyl choline.  Using a 3 Mac blade,  the oropharynx was entered, and laryngoscopy was performed.  Vocal cords  were well-seen.  An 8 mm endotracheal tube was passed through the vocal  cords to bilateral breath sounds.  There was positive CO2 noted on Easy  Count device.  CRNA assisting was Merril Abbe, CRNA.  Ventilation care was  turned over to respiratory therapy and to the medicine service.                                               Sheldon Silvan, M.D.    DC/MEDQ  D:  01/09/2003  T:  01/09/2003  Job:  [id number]   cc:   Anesthesia Department

## 2011-05-13 NOTE — Discharge Summary (Signed)
[name], LUCCHETTI                        ACCOUNT NO.:  [id number]   MEDICAL RECORD NO.:  [id number]                   PATIENT TYPE:  IPS   LOCATION:  0400                                 FACILITY:  [facility]   PHYSICIAN:  Jeanice Lim, M.D.              DATE OF BIRTH:  May 15, 1941   DATE OF ADMISSION:  11/30/2002  DATE OF DISCHARGE:  12/04/2002                                 DISCHARGE SUMMARY   IDENTIFYING DATA:  This is a 70 year old Caucasian female voluntarily  admitted for manic behavior, presenting with a history of mania and bizarre  behavior, calling the police, fire department and EMS periodically because  she wanted to find a good man.  This is the patient's second admission; she  was here in October for delusional thoughts.   MEDICATIONS:  Potassium, Lasix, Aldactone, Synthroid, Vioxx and albuterol  inhalers.   ALLERGIES:  No known drug allergies.   PHYSICAL EXAMINATION:  Physical exam performed at [facility] was  essentially within normal limits except for obesity and some skin changes.   LABORATORY DATA:  Alcohol level was 152.  Routine admission labs were  essentially within normal limits other than the alcohol level.   MENTAL STATUS EXAMINATION:  Older, middle-aged, obese female, mostly  cooperative.  Speech was clear.  She was somewhat talkative.  Mood was  euthymic, affect somewhat expansive, thought process mostly goal-directed,  perseverating on the need to find a man and everything would be fine, denied  suicidal or homicidal ideations, cognitively intact, judgment and insight  poor.   ADMITTING DIAGNOSES:   AXIS I:  1. 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  2. Hypomanic.  3. Alcohol abuse.   AXIS II:  None.   AXIS III:  1. History of cirrhosis or alcohol-induced hepatitis.  2. Hypothyroidism.  3. Chronic obstructive pulmonary disease.   AXIS IV:  Moderate to severe -- limited  support system and other  psychosocial stressors.   AXIS V:  25/55.   HOSPITAL COURSE:  The patient was admitted, ordered routine p.r.n.  medications, underwent further monitoring and was encouraged to participate  in individual, group and milieu therapy, was resumed on medical medications,  including placed on a pulse oximetry and fall risk and was started Seroquel  in addition to her medical medications to target mood and possible  delusions.  Seroquel was then discontinued due to lack of response, and due  to the patient's obesity, Geodon was started, after the patient demonstrated  adequate understanding of the risks and benefit ratio in alternative  treatments.  Geodon and Trileptal were optimized in order to stabilize the  mood and thought disorder and the patient reported a rapid positive response  to medication and stabilization.  She experienced no significant withdrawal  symptoms and her condition at discharge was markedly improved; mood was more  euthymic, affect bright, thought  processes goal-directed, thought content  negative for dangerous ideations or psychotic symptoms.  The patient  reported motivation to be compliant with the aftercare plan, feeling that  she did not need to take the medications, she only needed a man, however,  she was willing to continue to see a psychiatrist and take the medications  as prescribed, since she was feeling better.   DISCHARGE MEDICATIONS:  She is discharged on medications of:  1. Levothyroxine 50 mg q.a.m.  2. Vioxx 25 mg every day.  3. Lasix 40 mg q.a.m.  4. Spironolactone 100 mg b.i.d.  5. Trileptal 150 mg q.a.m. and every 6 p.m. and two q.h.s.  6. Geodon 60 mg b.i.d. with food.   FOLLOWUP:  The patient was discharged to follow up with Dr. Ernie Hew on  December 11th and with [facility] regarding medical conditions.   DISCHARGE DIAGNOSES:   AXIS I:  1. Psychotic disorder, not otherwise specified, likely bipolar disorder with      mild psychotic features.  2. Hypomanic.  3. Alcohol abuse.   AXIS II:  None.   AXIS III:  1. History of cirrhosis or alcohol-induced hepatitis.  2. Hypothyroidism.  3. Chronic obstructive pulmonary disease.   AXIS IV:  Moderate to severe -- limited support system and other  psychosocial stressors.   AXIS V:  Global assessment of functioning on discharge was 50.                                                 Jeanice Lim, M.D.    JEM/MEDQ  D:  12/05/2002  T:  12/06/2002  Job:  [id number]

## 2011-05-13 NOTE — Discharge Summary (Signed)
   [name], [name]                        ACCOUNT NO.:  [id number]   MEDICAL RECORD NO.:  [id number]                   PATIENT TYPE:  INP   LOCATION:  0373                                 [facility]ACILITY[facility]   PHYSICIAN:  Deirdre Peer. Polite, M.D.              DATE OF BIRTH:  Feb 07, 1941   DATE OF ADMISSION:  06/01/2003  DATE OF DISCHARGE:  06/03/2003                                 DISCHARGE SUMMARY   DISCHARGE DIAGNOSES:  1. Chronic obstructive pulmonary disease exacerbation, stable at time of     discharge.  2. Psychosis, inpatient admission recommended secondary to the patient's     impaired judgment with subsequent risk of neglect.  3. Cirrhosis.  4. Hypothyroidism.  5. History of psychiatric illness in the past, bipolar.   DISCHARGE MEDICATIONS:  1. Synthroid.  2. Albuterol and Atrovent nebulizers.  3. Spironolactone.   DISPOSITION:  The patient is transferred to psychiatric facility per  recommendation of Dr. Jeanie Sewer.   CONSULTATIONS:  Dr. Jeanie Sewer of psychiatry.   LABORATORY DATA:  CBC within normal limits.  BMET within normal limits.  ABG  revealed a pH of 7.27, PCO2 57, PO2 249, 100% non-rebreather.  Chest x-ray  showed no infiltrate.   HISTORY OF PRESENT ILLNESS:  The patient is a 70 year old white female with  a history of multiple medical problems presented to the ED with complaints  of shortness of breath and bizarre behavior.  In the ED the patient was  found to be hypoxic with saturations in the 80's, coarse breath sounds,  known history of chronic obstructive pulmonary disease.  ABG was obtained  which showed hypercarbia.  Admission was deemed necessary for further  evaluation and treatment.   HOSPITAL COURSE:  [name] was admitted to a medical floor bed.  Treatment of chronic obstructive pulmonary disease exacerbation.  She was  treated in typical fashion with O2, nebulizers, and antibiotics with rapid  improvement.  The patient's underlying  psychosis did complicate her medical  care, as she consistently pulled off her oxygen mask and began to refuse  treatments.  The patient at times was walking in the hallways and undress.  Psychiatry consult was obtained by Dr. Jeanie Sewer.  It was deemed necessary  to have the patient admitted for psychiatric evaluation against her will.  The patient was discharged in stable condition for outpatient psychiatric  care.                                               Deirdre Peer. Polite, M.D.    RDP/MEDQ  D:  06/06/2003  T:  06/07/2003  Job:  [id number]

## 2011-05-13 NOTE — Discharge Summary (Signed)
NAMEDENINA, RIEGER            ACCOUNT NO.:  [id number]   MEDICAL RECORD NO.:  [id number]          PATIENT TYPE:  INP   LOCATION:  4702                         FACILITY:  [facility]   PHYSICIAN:  C. Ulyess Mort, M.D.DATE OF BIRTH:  03-10-1941   DATE OF ADMISSION:  02/10/2006  DATE OF DISCHARGE:  02/13/2006                                 DISCHARGE SUMMARY   DISCHARGE DIAGNOSES:  1.  New-onset diabetes mellitus, type 2.  2.  Orthostatic hypotension secondary to volume contraction.  3.  Hyperkalemia.  4.  Thrombocytopenia.  5.  Cirrhosis.  6.  Cutaneous T-cell lymphoma.  7.  Chronic obstructive pulmonary disease/tobacco abuse.  8.  Hypothyroidism.  9.  Bipolar disorder.  10. Alzheimer's disease.  11. 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   DISCHARGE MEDICATIONS:  1.  Metformin 500 mg twice daily.  2.  Glucotrol 5 mg daily.  3.  Synthroid 100 mcg daily.  4.  Lactulose 10 mL daily.  5.  Depakote 500 mg twice daily.  6.  Ambien 10 mg nightly.  7.  Aspirin 325 mg daily.  8.  Aricept 5 mg daily.  9.  Lipitor 10 mg daily.  10. DuoNebs every 6 hours as needed for dyspnea.  11. Pulmicort Respules every 12 hours.  12. Interferon alpha-2a shot 3 mU 3 times a week, Monday, Wednesday and      Friday.  13. Promethazine 25 mg q.6 h. p.r.n. nausea.   We asked that the patient no take her Lasix, Aldactone or potassium  supplementation until further advised by Dr. Zetta Bills.   CONDITION AT DISCHARGE:  Stable and improved.  CBGs under better control.  Patient to follow up with Dr. Zetta Bills on February 21, 2006 at 9:30 a.m.,  to follow up on her fasting CBG, BMET and CBC.   CONSULTANTS:  None.   ADMITTING HISTORY AND PHYSICAL:  The patient is a 70 year old Caucasian  female with a history of hepatic cirrhosis, T-cell lymphoma (cutaneous) and  COPD, who presented to the clinic for a regular visit.  While there, it was  noticed that  her blood glucose was 682 on February 08, 2006 and recheck to  be 566 on the day of admission.  When asked, the patient endorsed polyuria,  nocturia, polydipsia and a 20-pound weight loss within the last 3 weeks  prior to admission.  She denies a family history of diabetes.  No fevers,  chills, neuropathy or weakness.  No dysuria or flank pain and the patient  was admitted per the request of Dr. Zetta Bills.   ALLERGIES:  None.   PAST MEDICAL HISTORY:  1.  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  2.  Cutaneous T-cell lymphoma.  3.  COPD.  4.  Hyperlipidemia.  5.  Hypothyroidism.  6.  Bipolar disorder.  7.  Alzheimer's disease.  8.  Broca's aphasia.  9.  CVA in January 2003 as well as July 2004.   MEDICATIONS:  1.  Lasix 40 mg daily; however, the patient has not taken this in the  past 5      days prior to admission.  2.  Synthroid 100 mcg daily.  3.  Lactulose 10 mL daily.  4.  Depakote 500 mg twice daily.  5.  Ambien 10 mg nightly.  6.  Aspirin 325 mg daily.  7.  Aricept 5 mg daily.  8.  Lipitor 10 mg daily.  9.  DuoNebs q.6 h. p.r.n.  10. Pulmicort Respules q.12 h.  11. Interferon alpha-2a 3 mU 3 times per week, Monday, Wednesday and Friday.  12. Promethazine 25 mg q.6 h. p.r.n.  13. Ondansetron 8 mg q.6 h. p.r.n.  14. Triamcinolone ointment as needed.  15. UV narrow band light therapy.  16. Questionable potassium supplementation.   SUBSTANCE HISTORY:  Current half-pack-per-day smoker x50 years.  The patient  states that she is an occasional alcohol drinker, having a sip of beer once  every weekend and denies any cocaine, marijuana or heroin use.   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  She  used to work as a Academic librarian.  She has Medicare health insurance and she  currently lives in an apartment with her female friend,  Arvid Right, who  is also her healthcare power of attorney.   FAMILY MEDICAL HISTORY:  Not significant.   REVIEW  OF SYSTEMS:  As per HPI.   PHYSICAL EXAMINATION:  VITALS:  Temperature 98.0, blood pressure 110/74,  pulse 81, respirations 16, O2 SATS 89% on room air, weight 231.5.  Orthostatics:  While lying down, blood pressure 126/87, pulse of 92; while  sitting up, blood pressure 97/64 with a pulse of 95.  The patient could not  stand at the time that the orthostatics were taken.  GENERAL:  Patient in no apparent distress, sitting in a wheelchair.  EYES:  PERRL.  EOMI.  ENT:  Moist mucous membranes.  Oropharynx clear.  NECK:  Supple without masses.  No JVD.  RESPIRATORY:  Respirations CTA bilaterally at apices, poor air movement  bilaterally in the bases, minor wheezes on expiration.  CARDIOVASCULAR:  Distant heart sounds, regular rate and rhythm, no murmurs,  gallops or rubs.  GI:  Obese abdomen, soft and non-distended.  Mild tenderness to percussion  in the right upper quadrant with minor rebound tenderness, no guarding,  positive bowel sounds.  EXTREMITIES:  No clubbing or cyanosis.  Edema 1+ to mid-shins bilaterally.  SKIN:  Warm and dry.  Hyperpigmented thickened skin on left shin as well as  a patch of similarly described skin also on the left abdomen.  LYMPHATICS:  No lymphadenopathy.  MUSCULOSKELETAL:  Strength 5/5 in all extremities.  No tenderness to  percussion in the muscles or joints.  NEUROLOGIC:  Cranial nerves II-XII intact.  Sensation intact.  Reflexes all  within normal limits.  PSYCHIATRIC:  Appropriate, slow to answer questions with mild speech  latency.   LABORATORY DATA:  Sodium 126, potassium 5.2, chloride 90, bicarb 29, BUN 11,  creatinine 1, glucose 556, bilirubin 1.2, alkaline phosphatase 111, AST 16,  ALT 10, protein 6.8, albumin 3.1, calcium 8.5.  White blood cells 7.8,  hemoglobin 14.6, hematocrit 42.1, platelets 123,000 with an MCV of 89.9.  Hemoglobin A1c 12.6.  PT 12.9, INR 1.0.  BNP 39.3.  HOSPITAL COURSE:  PROBLEM #1 - NEW-ONSET DIABETES MELLITUS, TYPE 2:   The  patient's CBG on admission was 550.  The patient was started on Metformin  500 mg twice daily and Glucotrol 5 mg daily.  She  was treated with a  moderate sliding-scale insulin during her inpatient stay.  The patient was  also educated regarding diabetes and its complications.  The patient is to  follow up with Dr. Allena Katz for close glucose monitoring.   PROBLEM #2 - ORTHOSTATIC HYPOTENSION LIKELY SECONDARY TO VOLUME CONTRACTION  WITH THE NEW-ONSET DIABETES MELLITUS, TYPE 2:  The patient described  polyuria and nocturia prior to admission with orthostatic blood pressure and  pulse measurements that were positive.  The patient was given normal saline  IV fluids for hydration and had no further complaints of dizziness or  presyncope as on admission.   PROBLEM #3 - HYPOKALEMIA:  On admission, the patient's potassium was 5.2 and  the patient was previously advised to stop her spironolactone and potassium  supplement per the request of Dr. Allena Katz.  We followed the potassium and  noted that it trended downwards to 3.9 on discharge.   PROBLEM #4 - THROMBOCYTOPENIA:  The patient's platelets on admission were  123 and they continued to trend downward during the inpatient stay.  This is  likely secondary to her interferon alpha-2a therapy as well as her liver  disease.  On discharge, the patient's platelets were 93,000 and we will  follow this closely as an outpatient.   PROBLEM #5 - CIRRHOSIS:  This is secondary to extensive alcohol abuse per  patient report.  The patient's condition was stable without ascites and no  signs of hepatic encephalopathy.  We continued her home dose of lactulose.   PROBLEM #6 - CUTANEOUS T-CELL LYMPHOMA:  This was stable.  The patient  continued interferon alpha-2a therapy, inpatient, and will continue this as  well as triamcinolone cream and light therapy as an outpatient.   PROBLEM #7 - CHRONIC OBSTRUCTIVE PULMONARY DISEASE/TOBACCO ABUSE:  Currently  stable.   The patient has a home regimen of DuoNebs q.6 h. p.r.n. and  Pulmicort Respules q.12 h. with 4 L of nasal cannula oxygen on a 24-hour  basis.  We continued these medications and maintained the patient's O2 SATS  at 92% to 97% on 2-L nasal cannula.  The patient at first expressed interest  in quitting smoking.  We gave her smoking cessation counseling and started  her on a nicotine patch daily.  By the time of discharge, the patient said  she no longer wanted to quit smoking, despite demonstrating an understanding  of its ill consequences.   PROBLEM #8 - HYPOTHYROIDISM:  Stable.  We continue the Synthroid.   PROBLEM #9 - BIPOLAR DISORDER:  Stable.  Continued Depakote.   PROBLEM #10 - ALZHEIMER'S DISEASE:  Stable.  Continued Aricept.   PROBLEM #11 - BROCA'S APHASIA SECONDARY TO CEREBROVASCULAR ACCIDENT IN 2003  AND 2004:  Stable and continued the patient's regimen of aspirin 325 mg  daily.  DISCHARGE LABORATORIES AND VITALS:  Temperature 97.5, heart rate 68,  respirations 20, blood pressure 108/67, O2 SATS 97% on 2-L nasal cannula.   CBG 127.  Sodium 141, potassium 3.9, chloride 109, bicarb 28, BUN 4,  creatinine 0.7, glucose 110 with a calcium of 7.6, corrected calcium 8.8.  White blood cell count 6.5, hemoglobin 11.7, hematocrit 34.2, platelets  93,000.   PENDING LABORATORIES:  None.      Dennis Bast, MD    ______________________________  C. Ulyess Mort, M.D.    Rivka Safer  D:  02/13/2006  T:  02/14/2006  Job:  [id number]   cc:   Zetta Bills, MD  Fax: [phone]

## 2011-05-13 NOTE — Discharge Summary (Signed)
[name], [name]                        ACCOUNT NO.:  [id number]   MEDICAL RECORD NO.:  [id number]                   PATIENT TYPE:  INP   LOCATION:  5029                                 FACILITY:  [facility]   PHYSICIAN:  [name], M.D.               DATE OF BIRTH:  [birth year]   DATE OF ADMISSION:  12/12/2003  DATE OF DISCHARGE:  12/16/2003                                 DISCHARGE SUMMARY   DISCHARGE DIAGNOSES:  1. Lethargy/altered mental status of multifactorial etiology.     a. Hepatic encephalopathy.     b. Benzodiazepine/narcotic overdose.     c. Hypothyroidism.  2. Acute exacerbation of chronic obstructive pulmonary disease.  3. Broca's aphasia.  4. Bipolar disorder.  5. Hypothyroidism.  6. Obesity.   DISCHARGE MEDICATIONS:  1. Synthroid 100 mcg 1 p.o. daily.  2. Aldactone 50 mg 1 p.o. daily.  3. Depakote 500 mg 1 p.o. q.12 h.  4. Risperdal 1 mg 1 p.o. q.12 h.  5. Lactulose 10 mL 1 p.o. daily.  6. Avelox 400 mg 1 p.o. daily for 7 days (to complete a total treatment     duration of 8 days).  7. Ambien 10 mg 1 p.o. nightly.  8. Combivent metered-dose inhaler 2 puffs q.6 h. p.r.n. shortness of breath.   DISPOSITION AND FOLLOWUP:  The patient was discharged to her home and she  will be receiving physiotherapy/physical assistance from [facility].  The patient will be following up with Dr. Tresa Endo L. Vollmer at the  [facility] on December 17, 2003 at 3 p.m.  The primary reason for  this followup visit will be to ensure entire resolution of her acute  exacerbation of chronic obstructive pulmonary disease and to ensure that her  mental status is back to baseline.  The patient was advised not to take any  Restoril.   HISTORY OF PRESENT ILLNESS:  The patient presents with a 58-month history of  lethargic feeling and easy fatigability.  The patient also notes associated  confusion throughout her day-to-day activities.  She has prior history of  alcoholic cirrhosis, bipolar disorder and chronic obstructive pulmonary  disease.  She is status post CVA in January of 2004.  The patient also notes  an associated chronic cough and occasional difficulty breathing, especially  after she ran out of her Combivent MDI.   PAST MEDICAL HISTORY:  This is significant for:  1. Alcoholic cirrhosis.  2. Bipolar disorder.  3. COPD.  4. Hypothyroidism.  5. Status post CVA in January of 2004.  6. History of acute pancreatitis.  7. History of COPD (status post VDRF in January of 2004).  8. History of atopic dermatitis.   MEDICATIONS ON PRESENTATION:  1. Combivent 2 puffs 4 times daily.  2. Continuous home oxygen at 2 L per minute.  3. Synthroid 100 mcg p.o. daily.  4. Spirolactone 50 mg p.o. daily.  5. Risperdal.  6. Restoril 30 mg p.o. nightly.  7. Depakote 500 mg q.a.m. and nightly.   SOCIAL HISTORY:  The patient is a current smoker, smokes half a pack a day  of cigarettes for the past several years.  The patient denies any current  alcohol use.  The patient denies any associated IV drug use.  The patient is  single and lives with her boyfriend.  The patient has Medicaid that finances  both her healthcare and prescriptions.   PHYSICAL EXAM:  VITAL SIGNS:  On physical exam, she has a pulse over 100,  blood pressure of 117/76, temperature of 97.9, respirations of 20 per minute  and oxygen saturation of 87% on room air.  GENERAL:  On general assessment the patient is sitting up in bed, appears  unkempt and is on oxygen via nasal cannula.  HEENT:  Her pupils are bilaterally equal, reactive to light.  NECK:  Her neck is supple and she has no goiter palpable.  RESPIRATORY SYSTEM:  Breath sounds are decreased bilaterally, on the left  side particularly.  She has no audible rhonchi or rales.  CARDIOVASCULAR:  Her pulse is tachycardic with a regular rhythm.  Heart  sounds, S1 and S2 are normal.  ABDOMEN:  Her abdomen is soft, distended, with  generalized tenderness.  No  mass or organomegaly is palpable.  She has a fluid thrill that is percussed.  Her bowel sounds are normal.  EXTREMITIES:  She has minimal bipedal pitting edema.  NEUROLOGIC:  She is oriented to time, person and place.  Cranial nerves II-  XII are normal.  She has a nonfocal sensory and motor exam.   ADMISSION LABORATORIES:  She has a sodium of 139, potassium of 4.0, chloride  of 94, bicarbonate of 39, BUN of 9, creatinine of 0.6 and glucose of 88.  She has a hemoglobin of 12.4, white cell count of 8.1 and platelet count of  178,000.  She has a total bilirubin of 0.4, alkaline phosphatase of 42, SGOT  of 12, SGPT of 9, protein of 6.2, albumin of 2.5 and calcium of 8.7.  Her  urinalysis is normal and urine microscopy culture and sensitivity reveals  rare bacteria.  Her serum valproic acid level is at 93 mcg/mL.  Her serum  ammonia level is at 72 mcmol/L.   Her chest x-ray on admission shows peribronchial thickening.   HOSPITAL COURSE:  PROBLEM #1 - LETHARGY:  This was thought to be  multifactorial but highly likely to be secondary to hepatic encephalopathy,  stage 1.  The reason for this were her elevated levels of serum ammonia as  well as a preexisting condition of alcoholic liver cirrhosis.  The  possibility of her having drug-induced sedation/overdose, especially given a  benzodiazepine such as Risperdal and temazepam that she was using, this  could also be a differential diagnosis.  Her hypothyroidism may have also  been playing a role in the entire condition and she may have been missing  out some doses and as such, provoking herself to confusion.  We started the  patient initially on Neomycin and later transitioned it to Lactulose so as  to achieve good stool movement and GI clearance of ammonia.  The patient's  mental status dramatically improved over her entire hospitalization stay and  prior to discharge, she was well-oriented to time, person and  place and being able to carry out logical conversations.  Prior to discharge, the  patient was prescribed against drinking alcohol and was asked to  follow up  with her primary care physician, Dr. Tresa Endo L. Vollmer.   PROBLEM #2 - ACUTE EXACERBATION OF CHRONIC OBSTRUCTIVE PULMONARY DISEASE:  This on admission did not appear well-controlled, as was evident by her  running out of Combivent.  The patient was also not breathing normally and  was desaturating at room air.  There were no obvious signs of pulmonary  infection at admission, but this was evident on the second day of  hospitalization when the patient started having fevers and an increasing  white cell count.  The patient was started on treatment for community-  acquired pneumonia with Avelox initially intravenously and after 1 day, this  was changed over to oral, after the patient remained afebrile for more than  24 hours.  The total treatment duration is recommended to be 8 days and the  patient will continue this as an outpatient.  It will be important to also  reinstitute nebulization/MDI treatment in this patient as this was done  during the hospitalization.   PROBLEM #3 - CHRONIC ALCOHOLIC CIRRHOSIS:  This was managed initially with  spironolactone and the patient was started on Neomycin/lactulose.  Her  ascites was quite evident and this was just merely a sign that her ascites  was not extremely well-managed.  The probability of hypothyroidism also  provoking ascites cannot be entirely ruled out and this should be followed  as an outpatient.   PROBLEM #4 - BIPOLAR DISORDER:  The patient, through her hospitalization  stay, did not appear to have any mood elevation or depression and was  continued on her Depakote and Risperdal.  Of note, these two drugs were  withheld on the first 48 hours of presentation, as we did not want to cloud  her mentation to continue neurological assessments.   PROBLEM #5 - HYPOTHYROIDISM:  The  patient was continued on her regular home  dose of Synthroid at 100 mcg per day and serum TSH level was also checked;  this was at 1.408, which clearly indicates that she was probably compliant  with her medications, but she may have forgotten a few doses along the way.      Judith Bills, MD                             [name], M.D.    JP/MEDQ  D:  01/17/2004  T:  01/18/2004  Job:  [id number]   cc:   Tresa Endo L. Philipp Deputy, M.D.  [phone] [address]  [place] [address]  Fax: [phone]

## 2011-07-30 ENCOUNTER — Emergency Department (HOSPITAL_COMMUNITY)
Admission: EM | Admit: 2011-07-30 | Discharge: 2011-07-30 | Disposition: A | Discharge status: Home / Self Care | Payer: Medicare Other | Attending: Emergency Medicine | Admitting: Emergency Medicine

## 2011-07-30 ENCOUNTER — Emergency Department (HOSPITAL_COMMUNITY): Payer: Medicare Other

## 2011-07-30 DIAGNOSIS — M79609 Pain in unspecified limb: Secondary | ICD-10-CM | POA: Insufficient documentation

## 2011-07-30 DIAGNOSIS — F17200 Nicotine dependence, unspecified, uncomplicated: Secondary | ICD-10-CM | POA: Insufficient documentation

## 2011-07-30 DIAGNOSIS — J4489 Other specified chronic obstructive pulmonary disease: Secondary | ICD-10-CM | POA: Insufficient documentation

## 2011-07-30 DIAGNOSIS — Z87898 Personal history of other specified conditions: Secondary | ICD-10-CM | POA: Insufficient documentation

## 2011-07-30 DIAGNOSIS — M25559 Pain in unspecified hip: Secondary | ICD-10-CM | POA: Insufficient documentation

## 2011-07-30 DIAGNOSIS — K7030 Alcoholic cirrhosis of liver without ascites: Secondary | ICD-10-CM | POA: Insufficient documentation

## 2011-07-30 DIAGNOSIS — F1020 Alcohol dependence, uncomplicated: Secondary | ICD-10-CM | POA: Insufficient documentation

## 2011-07-30 DIAGNOSIS — J449 Chronic obstructive pulmonary disease, unspecified: Secondary | ICD-10-CM | POA: Insufficient documentation

## 2011-10-06 LAB — BASIC METABOLIC PANEL
BUN: 12
BUN: 12
BUN: 9
CO2: 30
CO2: 31
CO2: 31
Calcium: 7.7 — ABNORMAL LOW
Calcium: 7.8 — ABNORMAL LOW
Calcium: 8.2 — ABNORMAL LOW
Calcium: 8.3 — ABNORMAL LOW
Chloride: 105
Chloride: 106
Chloride: 106
Creatinine, Ser: 0.52
Creatinine, Ser: 0.55
Creatinine, Ser: 0.58
Creatinine, Ser: 0.58
GFR calc Af Amer: >60
GFR calc Af Amer: >60
GFR calc Af Amer: >60
GFR calc non Af Amer: >60
GFR calc non Af Amer: >60
GFR calc non Af Amer: >60
Glucose, Bld: 42 — ABNORMAL LOW
Glucose, Bld: 55 — ABNORMAL LOW
Glucose, Bld: 70
Glucose, Bld: 74
Potassium: 3.4 — ABNORMAL LOW
Potassium: 4.3
Sodium: 140
Sodium: 140
Sodium: 141

## 2011-10-06 LAB — CBC
HCT: 32.3 — ABNORMAL LOW
HCT: 41.8
Hemoglobin: 12.4
Hemoglobin: 12.9
Hemoglobin: 13
MCHC: 32.7
MCHC: 33
MCHC: 33.3
MCHC: 33.3
MCV: 92.7
MCV: 93.2
MCV: 93.4
MCV: 94.1
MCV: 94.9
Platelets: 108 — ABNORMAL LOW
Platelets: 120 — ABNORMAL LOW
Platelets: 92 — ABNORMAL LOW
Platelets: 93 — ABNORMAL LOW
RBC: 4.03
RBC: 4.11
RBC: 4.19
RDW: 16.2 — ABNORMAL HIGH
RDW: 16.2 — ABNORMAL HIGH
RDW: 16.5 — ABNORMAL HIGH
RDW: 16.6 — ABNORMAL HIGH
RDW: 16.7 — ABNORMAL HIGH
WBC: 5.2
WBC: 7.8

## 2011-10-06 LAB — LIPID PANEL
Cholesterol: 89
HDL: 29 — ABNORMAL LOW
LDL Cholesterol: 46
Triglycerides: 68

## 2011-10-06 LAB — HEMOGLOBIN A1C
Hgb A1c MFr Bld: 5.7
Mean Plasma Glucose: 126

## 2011-10-06 LAB — DIFFERENTIAL
Basophils Relative: 0
Eosinophils Absolute: 0
Monocytes Relative: 10
Neutrophils Relative %: 42 — ABNORMAL LOW

## 2011-10-06 LAB — COMPREHENSIVE METABOLIC PANEL
ALT: 21
Alkaline Phosphatase: 60
CO2: 30
Glucose, Bld: 97
Potassium: 4.8
Sodium: 141
Total Protein: 5.5 — ABNORMAL LOW

## 2011-10-06 LAB — URINALYSIS, ROUTINE W REFLEX MICROSCOPIC
Glucose, UA: NEGATIVE
Ketones, ur: 15 — AB
Nitrite: NEGATIVE
Nitrite: POSITIVE — AB
Protein, ur: 30 — AB
Specific Gravity, Urine: 1.025
Urobilinogen, UA: 1
pH: 6

## 2011-10-06 LAB — URINE MICROSCOPIC-ADD ON

## 2011-10-06 LAB — ETHANOL: Alcohol, Ethyl (B): <5

## 2011-10-06 LAB — URINE CULTURE: Colony Count: >=100000

## 2011-10-06 LAB — VALPROIC ACID LEVEL: Valproic Acid Lvl: 55

## 2011-10-06 LAB — TSH: TSH: 3.752

## 2011-10-13 LAB — LIPASE, BLOOD: Lipase: 66 — ABNORMAL HIGH

## 2011-10-13 LAB — BASIC METABOLIC PANEL
CO2: 31
Chloride: 101
GFR calc Af Amer: >60
Potassium: 4
Sodium: 139

## 2011-10-13 LAB — DIFFERENTIAL
Basophils Relative: 1
Eosinophils Relative: 1
Monocytes Absolute: 0.6
Monocytes Relative: 6
Neutro Abs: 5.5

## 2011-10-13 LAB — URINALYSIS, ROUTINE W REFLEX MICROSCOPIC
Glucose, UA: >1000 — AB
Ketones, ur: 15 — AB
Nitrite: NEGATIVE
pH: 6

## 2011-10-13 LAB — CBC
HCT: 41.3
Hemoglobin: 13.7
MCHC: 33.1
MCV: 88.2
RBC: 4.68

## 2011-10-13 LAB — HEPATIC FUNCTION PANEL
Albumin: 2.8 — ABNORMAL LOW
Alkaline Phosphatase: 45
Total Bilirubin: 0.7

## 2011-10-13 LAB — URINE MICROSCOPIC-ADD ON

## 2012-04-23 ENCOUNTER — Emergency Department (HOSPITAL_BASED_OUTPATIENT_CLINIC_OR_DEPARTMENT_OTHER)
Admission: EM | Admit: 2012-04-23 | Discharge: 2012-04-23 | Disposition: A | Discharge status: Home / Self Care | Payer: Medicare Other | Attending: Emergency Medicine | Admitting: Emergency Medicine

## 2012-04-23 ENCOUNTER — Encounter (HOSPITAL_BASED_OUTPATIENT_CLINIC_OR_DEPARTMENT_OTHER): Payer: Self-pay | Admitting: *Deleted

## 2012-04-23 ENCOUNTER — Emergency Department (INDEPENDENT_AMBULATORY_CARE_PROVIDER_SITE_OTHER): Payer: Medicare Other

## 2012-04-23 DIAGNOSIS — J4489 Other specified chronic obstructive pulmonary disease: Secondary | ICD-10-CM | POA: Insufficient documentation

## 2012-04-23 DIAGNOSIS — F17200 Nicotine dependence, unspecified, uncomplicated: Secondary | ICD-10-CM | POA: Insufficient documentation

## 2012-04-23 DIAGNOSIS — J449 Chronic obstructive pulmonary disease, unspecified: Secondary | ICD-10-CM | POA: Insufficient documentation

## 2012-04-23 DIAGNOSIS — R0602 Shortness of breath: Secondary | ICD-10-CM

## 2012-04-23 DIAGNOSIS — E119 Type 2 diabetes mellitus without complications: Secondary | ICD-10-CM | POA: Insufficient documentation

## 2012-04-23 DIAGNOSIS — J441 Chronic obstructive pulmonary disease with (acute) exacerbation: Secondary | ICD-10-CM | POA: Insufficient documentation

## 2012-04-23 DIAGNOSIS — R21 Rash and other nonspecific skin eruption: Secondary | ICD-10-CM | POA: Insufficient documentation

## 2012-04-23 DIAGNOSIS — Z9981 Dependence on supplemental oxygen: Secondary | ICD-10-CM | POA: Insufficient documentation

## 2012-04-23 DIAGNOSIS — I10 Essential (primary) hypertension: Secondary | ICD-10-CM | POA: Insufficient documentation

## 2012-04-23 HISTORY — DX: Essential (primary) hypertension: I10

## 2012-04-23 HISTORY — DX: Chronic obstructive pulmonary disease, unspecified: J44.9

## 2012-04-23 HISTORY — DX: Pure hypercholesterolemia, unspecified: E78.00

## 2012-04-23 LAB — BASIC METABOLIC PANEL
BUN: 14 mg/dL (ref 6–23)
CO2: 27 mEq/L (ref 19–32)
Chloride: 101 mEq/L (ref 96–112)
Creatinine, Ser: 0.5 mg/dL (ref 0.50–1.10)

## 2012-04-23 LAB — CBC
HCT: 42 % (ref 36.0–46.0)
Hemoglobin: 13.8 g/dL (ref 12.0–15.0)
WBC: 8.6 10*3/uL (ref 4.0–10.5)

## 2012-04-23 LAB — DIFFERENTIAL
Lymphocytes Relative: 39 % (ref 12–46)
Lymphs Abs: 3.3 10*3/uL (ref 0.7–4.0)
Monocytes Absolute: 0.4 10*3/uL (ref 0.1–1.0)
Monocytes Relative: 5 % (ref 3–12)
Neutro Abs: 4.6 10*3/uL (ref 1.7–7.7)

## 2012-04-23 MED ORDER — IPRATROPIUM BROMIDE 0.02 % IN SOLN
0.5000 mg | Freq: Once | RESPIRATORY_TRACT | Status: AC
  Administered 2012-04-23: 0.5 mg via RESPIRATORY_TRACT
  Filled 2012-04-23: qty 2.5

## 2012-04-23 MED ORDER — PREDNISONE 50 MG PO TABS
60.0000 mg | ORAL_TABLET | Freq: Once | ORAL | Status: AC
  Administered 2012-04-23: 60 mg via ORAL
  Filled 2012-04-23: qty 1

## 2012-04-23 MED ORDER — ALBUTEROL SULFATE HFA 108 (90 BASE) MCG/ACT IN AERS
2.0000 | INHALATION_SPRAY | RESPIRATORY_TRACT | Status: DC | PRN
  Administered 2012-04-23: 2 via RESPIRATORY_TRACT
  Filled 2012-04-23: qty 6.7

## 2012-04-23 MED ORDER — PREDNISONE 20 MG PO TABS: 40.0000 mg | ORAL_TABLET | Freq: Every day | ORAL | Status: AC

## 2012-04-23 MED ORDER — ALBUTEROL SULFATE (5 MG/ML) 0.5% IN NEBU
5.0000 mg | INHALATION_SOLUTION | Freq: Once | RESPIRATORY_TRACT | Status: AC
  Administered 2012-04-23: 5 mg via RESPIRATORY_TRACT
  Filled 2012-04-23: qty 1

## 2012-04-23 NOTE — ED Notes (Signed)
Pt amb to room 11 with ems. Pt is awake, alert, no dyspnea noted. Pt states she has been feeling increasingly sob x Friday. Pt states she does not feel like she is getting enough oxygen from her home o2 concentrator. Pt wears o2 at 4 1/2 lpm via n/c at all times, sats are 100%.

## 2012-04-23 NOTE — Discharge Instructions (Signed)

## 2012-04-23 NOTE — ED Provider Notes (Signed)
History     CSN: [id number]  Arrival date & time 04/23/12  1147   First MD Initiated Contact with Patient 04/23/12 1159      Chief Complaint  Patient presents with  . Shortness of Breath    (Consider location/radiation/quality/duration/timing/severity/associated sxs/prior treatment) Patient is a 71 y.o. female presenting with shortness of breath. The history is provided by the patient.  Shortness of Breath  The current episode started 5 to 7 days ago. The onset was gradual. The problem occurs continuously. The problem has been gradually worsening. The problem is moderate. The symptoms are relieved by nothing (she has been using nebulizers without improvement). The symptoms are aggravated by nothing. Associated symptoms include cough and shortness of breath. Pertinent negatives include no chest pain, no chest pressure, no fever, no rhinorrhea and no wheezing. The cough is non-productive. She has had intermittent steroid use. She has had no prior hospitalizations. She has had no prior ICU admissions. She has had no prior intubations. Her past medical history is significant for past wheezing. There were no sick contacts. Recently, medical care has been given by the PCP.    Past Medical History  Diagnosis Date  . Hypertension   . COPD (chronic obstructive pulmonary disease)   . Diabetes mellitus   . Elevated cholesterol     History reviewed. No pertinent past surgical history.  History reviewed. No pertinent family history.  History  Substance Use Topics  . Smoking status: Current Everyday Smoker  . Smokeless tobacco: Not on file  . Alcohol Use:     OB History    Grav Para Term Preterm Abortions TAB SAB Ect Mult Living                  Review of Systems  Constitutional: Negative for fever.  HENT: Negative for rhinorrhea.   Respiratory: Positive for cough and shortness of breath. Negative for wheezing.   Cardiovascular: Negative for chest pain.  Gastrointestinal: Negative  for nausea, vomiting and abdominal pain.  Neurological: Negative for headaches.  All other systems reviewed and are negative.    Allergies  Review of patient's allergies indicates no known allergies.  Home Medications   Current Outpatient Rx  Name Route Sig Dispense Refill  . LEVOTHYROXINE SODIUM 100 MCG PO TABS Oral Take 100 mcg by mouth daily.    Marland Kitchen METFORMIN HCL 1000 MG PO TABS Oral Take 1,000 mg by mouth 2 (two) times daily with a meal.    . SIMVASTATIN 20 MG PO TABS Oral Take 20 mg by mouth every evening.      BP 132/88  Pulse 72  Temp(Src) 98.6 F (37 C) (Oral)  Resp 18  Ht 5' 4.5" (1.638 m)  Wt 135 lb (61.236 kg)  BMI 22.81 kg/m2  SpO2 100%  Physical Exam  Nursing note and vitals reviewed. Constitutional: She is oriented to person, place, and time. She appears well-developed and well-nourished. No distress.  HENT:  Head: Normocephalic and atraumatic.  Eyes: EOM are normal. Pupils are equal, round, and reactive to light.  Cardiovascular: Normal rate, regular rhythm, normal heart sounds and intact distal pulses.  Exam reveals no friction rub.   No murmur heard. Pulmonary/Chest: Effort normal. She has decreased breath sounds. She has no wheezes. She has no rales.  Abdominal: Soft. Bowel sounds are normal. She exhibits no distension. There is no tenderness. There is no rebound and no guarding.  Musculoskeletal: Normal range of motion. She exhibits no edema and no tenderness.  No edema  Neurological: She is alert and oriented to person, place, and time. No cranial nerve deficit.  Skin: Skin is warm and dry. Rash noted.       Eczema present on bilateral lower extremitities   Psychiatric: She has a normal mood and affect. Her behavior is normal.    ED Course  Procedures (including critical care time)  Labs Reviewed  BASIC METABOLIC PANEL - Abnormal; Notable for the following:    Glucose, Bld 187 (*)    All other components within normal limits  CBC    DIFFERENTIAL  D-DIMER, QUANTITATIVE   Dg Chest 2 View  04/23/2012  *RADIOLOGY REPORT*  Clinical Data: Shortness of breath  CHEST - 2 VIEW  Comparison: 09/29/2007  Findings: Cardiomediastinal silhouette is stable.  No acute infiltrate or pleural effusion.  No pulmonary edema.  Bony thorax is stable.  IMPRESSION: No active disease.  No significant change.  Original Report Authenticated By: Natasha Mead, M.D.    Date: 04/23/2012  Rate: 68  Rhythm: normal sinus rhythm  QRS Axis: normal  Intervals: normal  ST/T Wave abnormalities: normal  Conduction Disutrbances:none  Narrative Interpretation:   Old EKG Reviewed: none available    No diagnosis found.    MDM   Patient with a history of COPD on chronic oxygen therapy at night states her last 3-4 days she's had worsening shortness of breath to the point where she is wearing oxygen all day long. She is using albuterol nebulizers at home without improvement. She states last week she went to see her regular physician Maryelizabeth Rowan because of the same shortness of breath and states she had an x-ray and the patient describes what sounds like a CAT scan done but unclear what the scans were done before. No notes in the chart so contacting the patient's physician for further records.  Patient is not distressed on exam. Mild decreased breath sounds but no wheezing at this time. Satting normally on room air and not tachypneic.  Patient denies any chest pain, diaphoresis, nausea, vomiting, abdominal pain. There is no signs of fluid overload.  We will likelihood for cardiac pathology based on the patient's above history. She denies any infectious symptoms. Feel most likely COPD exacerbation due to seasonal allergies and ongoing smoking. Patient given albuterol, Atrovent, prednisone. Chest x-ray and EKG ordered. Waiting for her medical records from her PCP  12:37 PM Records show that patient recently had a CT of her abdomen for gallstone pancreatitis but  has not had further evaluation of her lungs. CBC, BMP, d-dimer pending. Possibility for PE due to recent hospitalization  2:09 PM Labs are within normal limits. EKG and chest x-ray are within normal limits. D-dimer is negative. After albuterol, Atrovent, prednisone on reevaluation patient looks much more comfortable and states she feels she is breathing much better. We'll discharge him with steroids and followup with her PCP to   Gwyneth Sprout, MD 04/23/12 1410

## 2012-04-23 NOTE — ED Notes (Signed)
EMS reports patient is c/o of shortness of breath for the last three days.  States she is on oxygen 24/7 and continues to smoke daily.  EMS reports that they were called to her home on Saturday with the same complaints, patient choose not to come to the ed.

## 2012-07-19 ENCOUNTER — Encounter: Payer: Self-pay | Admitting: Internal Medicine

## 2012-07-19 ENCOUNTER — Ambulatory Visit (INDEPENDENT_AMBULATORY_CARE_PROVIDER_SITE_OTHER): Payer: Medicare Other | Admitting: Internal Medicine

## 2012-07-19 DIAGNOSIS — F17200 Nicotine dependence, unspecified, uncomplicated: Secondary | ICD-10-CM | POA: Insufficient documentation

## 2012-07-19 DIAGNOSIS — J9610 Chronic respiratory failure, unspecified whether with hypoxia or hypercapnia: Secondary | ICD-10-CM

## 2012-07-19 DIAGNOSIS — J449 Chronic obstructive pulmonary disease, unspecified: Secondary | ICD-10-CM | POA: Insufficient documentation

## 2012-07-19 NOTE — Assessment & Plan Note (Signed)

## 2012-07-19 NOTE — Patient Instructions (Signed)
The key is to stop smoking completely before smoking completely stops you - it's the most important aspect of your care!!!  Pulmonary follow up can be as needed

## 2012-07-19 NOTE — Progress Notes (Signed)
  Subjective:    Patient ID: [name], female    DOB: 1941/10/27  MRN: [id number]  HPI  73 yowf active smoker with 02 dep x around 2006 at hs only then around 04/2012 24h per day referred 07/19/2012 by Dr Duanne Guess to pulmonary clinic for copd eval with GOLD IV severity.   07/19/2012 1st pulmonary ov in EMR era cc "smoking" ("that's why I'm here) with indolent onset progressive doe x 6 years until 2 months ago >  Now As long as wears 02 can do anything she wants x 2 last months - if doesn't wear it "she dies" (hasn't tried off since then) and no cough, if misses neb treatment can't tell any difference.  No unusual cough, purulent sputum or sinus/hb symptoms on present rx.  No variability to daytime symptoms  Sleeping ok without nocturnal  or early am exacerbation  of respiratory  c/o's or need for noct saba. Also denies any obvious fluctuation of symptoms with weather or environmental changes or other aggravating or alleviating factors except as outlined above   Review of Systems  Constitutional: Negative for fever, chills and unexpected weight change.  HENT: Negative for ear pain, nosebleeds, congestion, sore throat, rhinorrhea, sneezing, trouble swallowing, dental problem, voice change, postnasal drip and sinus pressure.   Eyes: Negative for visual disturbance.  Respiratory: Positive for shortness of breath. Negative for cough and choking.   Cardiovascular: Negative for chest pain and leg swelling.  Gastrointestinal: Negative for vomiting, abdominal pain and diarrhea.  Genitourinary: Negative for difficulty urinating.  Musculoskeletal: Negative for arthralgias.  Skin: Negative for rash.  Neurological: Negative for tremors, syncope and headaches.  Hematological: Does not bruise/bleed easily.       Objective:   Physical Exam Wt Readings from Last 3 Encounters:  04/23/12 135 lb (61.236 kg)  07/25/08 161 lb 4 oz (73.143 kg)  08/07/07 162 lb (73.483 kg)    HEENT mild turbinate  edema.  Oropharynx no thrush or excess pnd or cobblestoning.  No JVD or cervical adenopathy. Mild accessory muscle hypertrophy. Trachea midline, nl thryroid. Chest was hyperinflated by percussion with diminished breath sounds and moderate increased exp time without wheeze. Hoover sign positive at mid inspiration. Regular rate and rhythm without murmur gallop or rub or increase P2 or edema.  Abd: no hsm, nl excursion. Ext warm without cyanosis or clubbing.   04/23/2012 cxr No active disease. No significant change.        Assessment & Plan:

## 2012-07-19 NOTE — Assessment & Plan Note (Signed)
-   Spirometry 07/19/2012 FEV1 0.55 (25%) ratio 34  GOLD IV / 02 dep and still smoking but now rendered "asymptomatic" because 02 relieves her sob.  Unfortunately she has extremely poor insight and doesn't understand she hasn't changed the natural hx of her dz just because she's wearing 02 and I have no success whatever getting through to her.   See smoking discussed separately  I did explain   the Flethcher curve with patient that basically indicates  if you quit smoking when your best day FEV1 is still adequate, which hers barely is now,  She can maintain her functional status fairlue well and informed the patient there was no medication on the market that has proven to change the curve or the likelihood of progression absent complete smoking abstinence.  Therefore stopping smoking and maintaining abstinence is the most important aspect of care, not choice of inhalers or for that matter, doctors (so we can see her prn)

## 2012-08-31 ENCOUNTER — Emergency Department (HOSPITAL_COMMUNITY): Payer: Medicare Other

## 2012-08-31 ENCOUNTER — Inpatient Hospital Stay (HOSPITAL_COMMUNITY)
Admission: EM | Admit: 2012-08-31 | Discharge: 2012-09-02 | DRG: 313 | Disposition: A | Discharge status: Home / Self Care | Payer: Medicare Other | Attending: Internal Medicine | Admitting: Internal Medicine

## 2012-08-31 DIAGNOSIS — J449 Chronic obstructive pulmonary disease, unspecified: Secondary | ICD-10-CM | POA: Diagnosis present

## 2012-08-31 DIAGNOSIS — F17200 Nicotine dependence, unspecified, uncomplicated: Secondary | ICD-10-CM | POA: Diagnosis present

## 2012-08-31 DIAGNOSIS — E039 Hypothyroidism, unspecified: Secondary | ICD-10-CM | POA: Diagnosis present

## 2012-08-31 DIAGNOSIS — R634 Abnormal weight loss: Secondary | ICD-10-CM

## 2012-08-31 DIAGNOSIS — C8409 Mycosis fungoides, extranodal and solid organ sites: Secondary | ICD-10-CM

## 2012-08-31 DIAGNOSIS — K7030 Alcoholic cirrhosis of liver without ascites: Secondary | ICD-10-CM | POA: Diagnosis present

## 2012-08-31 DIAGNOSIS — Z9981 Dependence on supplemental oxygen: Secondary | ICD-10-CM

## 2012-08-31 DIAGNOSIS — F05 Delirium due to known physiological condition: Secondary | ICD-10-CM | POA: Diagnosis present

## 2012-08-31 DIAGNOSIS — Z79899 Other long term (current) drug therapy: Secondary | ICD-10-CM

## 2012-08-31 DIAGNOSIS — Z8679 Personal history of other diseases of the circulatory system: Secondary | ICD-10-CM

## 2012-08-31 DIAGNOSIS — F319 Bipolar disorder, unspecified: Secondary | ICD-10-CM

## 2012-08-31 DIAGNOSIS — E119 Type 2 diabetes mellitus without complications: Secondary | ICD-10-CM | POA: Diagnosis present

## 2012-08-31 DIAGNOSIS — R0789 Other chest pain: Principal | ICD-10-CM | POA: Diagnosis present

## 2012-08-31 DIAGNOSIS — Z87898 Personal history of other specified conditions: Secondary | ICD-10-CM

## 2012-08-31 DIAGNOSIS — N390 Urinary tract infection, site not specified: Secondary | ICD-10-CM | POA: Diagnosis present

## 2012-08-31 DIAGNOSIS — Z8673 Personal history of transient ischemic attack (TIA), and cerebral infarction without residual deficits: Secondary | ICD-10-CM

## 2012-08-31 DIAGNOSIS — Z7982 Long term (current) use of aspirin: Secondary | ICD-10-CM

## 2012-08-31 DIAGNOSIS — R062 Wheezing: Secondary | ICD-10-CM

## 2012-08-31 DIAGNOSIS — F1020 Alcohol dependence, uncomplicated: Secondary | ICD-10-CM | POA: Diagnosis present

## 2012-08-31 DIAGNOSIS — R410 Disorientation, unspecified: Secondary | ICD-10-CM | POA: Diagnosis present

## 2012-08-31 DIAGNOSIS — I10 Essential (primary) hypertension: Secondary | ICD-10-CM | POA: Diagnosis present

## 2012-08-31 DIAGNOSIS — R0602 Shortness of breath: Secondary | ICD-10-CM

## 2012-08-31 DIAGNOSIS — Z23 Encounter for immunization: Secondary | ICD-10-CM

## 2012-08-31 DIAGNOSIS — J4489 Other specified chronic obstructive pulmonary disease: Secondary | ICD-10-CM | POA: Diagnosis present

## 2012-08-31 DIAGNOSIS — J9610 Chronic respiratory failure, unspecified whether with hypoxia or hypercapnia: Secondary | ICD-10-CM | POA: Diagnosis present

## 2012-08-31 DIAGNOSIS — E785 Hyperlipidemia, unspecified: Secondary | ICD-10-CM | POA: Diagnosis present

## 2012-08-31 HISTORY — DX: Cerebral infarction, unspecified: I63.9

## 2012-08-31 HISTORY — DX: Unspecified psychosis not due to a substance or known physiological condition: F29

## 2012-08-31 HISTORY — DX: Alcohol abuse, in remission: F10.11

## 2012-08-31 HISTORY — DX: Hypothyroidism, unspecified: E03.9

## 2012-08-31 HISTORY — DX: Unspecified asthma, uncomplicated: J45.909

## 2012-08-31 HISTORY — DX: Non-Hodgkin lymphoma, unspecified, unspecified site: C85.90

## 2012-08-31 HISTORY — DX: Alcoholic cirrhosis of liver without ascites: K70.30

## 2012-08-31 LAB — CBC
HCT: 41.8 % (ref 36.0–46.0)
Hemoglobin: 13.9 g/dL (ref 12.0–15.0)
MCH: 30.4 pg (ref 26.0–34.0)
MCHC: 33.3 g/dL (ref 30.0–36.0)
MCV: 91.5 fL (ref 78.0–100.0)
Platelets: 209 10*3/uL (ref 150–400)
RBC: 4.57 MIL/uL (ref 3.87–5.11)
RDW: 13.8 % (ref 11.5–15.5)
WBC: 15.4 10*3/uL — ABNORMAL HIGH (ref 4.0–10.5)

## 2012-08-31 LAB — BASIC METABOLIC PANEL
BUN: 12 mg/dL (ref 6–23)
CO2: 27 mEq/L (ref 19–32)
Calcium: 9 mg/dL (ref 8.4–10.5)
Chloride: 102 mEq/L (ref 96–112)
Creatinine, Ser: 0.44 mg/dL — ABNORMAL LOW (ref 0.50–1.10)
GFR calc Af Amer: >90 mL/min (ref >90)
GFR calc non Af Amer: >90 mL/min (ref >90)
Glucose, Bld: 191 mg/dL — ABNORMAL HIGH (ref 70–99)
Potassium: 3.8 mEq/L (ref 3.5–5.1)
Sodium: 139 mEq/L (ref 135–145)

## 2012-08-31 LAB — TROPONIN I: Troponin I: <0.3 ng/mL (ref <0.30)

## 2012-08-31 MED ORDER — SODIUM CHLORIDE 0.9 % IV BOLUS (SEPSIS)
500.0000 mL | INTRAVENOUS | Status: AC
  Administered 2012-08-31: via INTRAVENOUS

## 2012-08-31 MED ORDER — DIPHENHYDRAMINE HCL 50 MG/ML IJ SOLN
25.0000 mg | Freq: Once | INTRAMUSCULAR | Status: AC
  Administered 2012-08-31: 25 mg via INTRAVENOUS
  Filled 2012-08-31: qty 1

## 2012-08-31 MED ORDER — ALBUTEROL (5 MG/ML) CONTINUOUS INHALATION SOLN
10.0000 mg/h | INHALATION_SOLUTION | Freq: Once | RESPIRATORY_TRACT | Status: AC
  Administered 2012-08-31: 10 mg/h via RESPIRATORY_TRACT
  Filled 2012-08-31: qty 20

## 2012-08-31 MED ORDER — ASPIRIN 325 MG PO TABS
325.0000 mg | ORAL_TABLET | ORAL | Status: AC
  Administered 2012-08-31: 325 mg via ORAL
  Filled 2012-08-31: qty 1

## 2012-08-31 MED ORDER — GI COCKTAIL ~~LOC~~
30.0000 mL | Freq: Once | ORAL | Status: AC
  Administered 2012-08-31: 30 mL via ORAL
  Filled 2012-08-31: qty 30

## 2012-08-31 NOTE — ED Notes (Signed)
Pt arrived c/o chest pain with sudden onset took 2 asa at home.

## 2012-08-31 NOTE — ED Provider Notes (Signed)
History     CSN: [id number]  Arrival date & time 08/31/12  2012   First MD Initiated Contact with Patient 08/31/12 2157      Chief Complaint  Patient presents with  . Chest Pain    (Consider location/radiation/quality/duration/timing/severity/associated sxs/prior treatment) Patient is a 71 y.o. female presenting with chest pain. The history is provided by the patient.  Chest Pain The chest pain began 1 - 2 hours ago. Chest pain occurs constantly. The chest pain is unchanged. Associated with: unknown. At its most intense, the pain is at 8/10. The pain is currently at 8/10. The severity of the pain is mild. The quality of the pain is described as sharp. The pain does not radiate. Exacerbated by: nothing. Primary symptoms include shortness of breath and wheezing. Pertinent negatives for primary symptoms include no fever, no fatigue, no cough, no abdominal pain, no nausea, no vomiting and no dizziness. She tried nothing for the symptoms. Risk factors include being elderly (HTN, HLP, DM, smoker).     Past Medical History  Diagnosis Date  . Hypertension   . COPD (chronic obstructive pulmonary disease)   . Diabetes mellitus   . Elevated cholesterol   . History of ETOH abuse   . Hypothyroid   . Cirrhosis, alcoholic   . Psychosis   . Lymphoma     T cell  . Stroke     Occipital 2004  . Asthma     History reviewed. No pertinent past surgical history.  Family History  Problem Relation Age of Onset  . Family history unknown: Yes    History  Substance Use Topics  . Smoking status: Current Everyday Smoker -- 0.5 packs/day for 56 years    Types: Cigarettes  . Smokeless tobacco: Never Used  . Alcohol Use: No     Sober since 1993    OB History    Grav Para Term Preterm Abortions TAB SAB Ect Mult Living                  Review of Systems  Constitutional: Negative for fever and fatigue.  HENT: Negative for congestion, drooling and neck pain.   Eyes: Negative for pain.    Respiratory: Positive for shortness of breath and wheezing. Negative for cough.   Cardiovascular: Positive for chest pain.  Gastrointestinal: Negative for nausea, vomiting, abdominal pain and diarrhea.  Genitourinary: Negative for dysuria and hematuria.  Musculoskeletal: Negative for back pain and gait problem.  Skin: Negative for color change.  Neurological: Negative for dizziness and headaches.  Hematological: Negative for adenopathy.  Psychiatric/Behavioral: Negative for behavioral problems.  All other systems reviewed and are negative.    Allergies  Review of patient's allergies indicates no known allergies.  Home Medications   No current outpatient prescriptions on file.  BP 97/57  Pulse 88  Temp 98.4 F (36.9 C) (Oral)  Resp 21  Ht 5\' 4"  (1.626 m)  Wt 128 lb 14.4 oz (58.469 kg)  BMI 22.13 kg/m2  SpO2 100%  Physical Exam  Nursing note and vitals reviewed. Constitutional: She is oriented to person, place, and time. She appears well-developed and well-nourished.  HENT:  Head: Normocephalic.  Mouth/Throat: No oropharyngeal exudate.  Eyes: Conjunctivae and EOM are normal. Pupils are equal, round, and reactive to light.  Neck: Normal range of motion. Neck supple.  Cardiovascular: Normal rate, regular rhythm, normal heart sounds and intact distal pulses.  Exam reveals no gallop and no friction rub.   No murmur heard. Pulmonary/Chest: Effort  normal. No respiratory distress. She has wheezes (dec air movement bilaterally w/ mild wheezing).  Abdominal: Soft. Bowel sounds are normal. There is no tenderness. There is no rebound and no guarding.  Musculoskeletal: Normal range of motion. She exhibits no edema and no tenderness.  Neurological: She is alert and oriented to person, place, and time.  Skin: Skin is warm and dry. There is erythema (mild erythema diffusely to bilateral anterior LE's).  Psychiatric: She has a normal mood and affect. Her behavior is normal.    ED  Course  Procedures (including critical care time)  Labs Reviewed  CBC - Abnormal; Notable for the following:    WBC 15.4 (*)     All other components within normal limits  BASIC METABOLIC PANEL - Abnormal; Notable for the following:    Glucose, Bld 191 (*)     Creatinine, Ser 0.44 (*)     All other components within normal limits  POCT I-STAT TROPONIN I - Abnormal; Notable for the following:    Troponin i, poc 0.13 (*)     All other components within normal limits  URINALYSIS, ROUTINE W REFLEX MICROSCOPIC - Abnormal; Notable for the following:    Color, Urine AMBER (*)  BIOCHEMICALS MAY BE AFFECTED BY COLOR   APPearance TURBID (*)     Hgb urine dipstick TRACE (*)     Bilirubin Urine SMALL (*)     Protein, ur 30 (*)     Leukocytes, UA LARGE (*)     All other components within normal limits  HEPARIN LEVEL (UNFRACTIONATED) - Abnormal; Notable for the following:    Heparin Unfractionated <0.10 (*)     All other components within normal limits  PROTIME-INR - Abnormal; Notable for the following:    Prothrombin Time 15.3 (*)     All other components within normal limits  URINE MICROSCOPIC-ADD ON - Abnormal; Notable for the following:    Squamous Epithelial / LPF MANY (*)     Bacteria, UA MANY (*)     All other components within normal limits  BASIC METABOLIC PANEL - Abnormal; Notable for the following:    Glucose, Bld 245 (*)     Calcium 8.2 (*)     All other components within normal limits  CBC - Abnormal; Notable for the following:    WBC 15.7 (*)     All other components within normal limits  TSH - Abnormal; Notable for the following:    TSH 0.047 (*)     All other components within normal limits  GLUCOSE, CAPILLARY - Abnormal; Notable for the following:    Glucose-Capillary 249 (*)     All other components within normal limits  GLUCOSE, CAPILLARY - Abnormal; Notable for the following:    Glucose-Capillary 239 (*)     All other components within normal limits  AMMONIA -  Abnormal; Notable for the following:    Ammonia 61 (*)     All other components within normal limits  POCT I-STAT 3, BLOOD GAS (G3+) - Abnormal; Notable for the following:    pH, Arterial 7.588 (*)     pCO2 arterial 23.1 (*)     pO2, Arterial 173.0 (*)     All other components within normal limits  HEPARIN LEVEL (UNFRACTIONATED) - Abnormal; Notable for the following:    Heparin Unfractionated <0.10 (*)     All other components within normal limits  GLUCOSE, CAPILLARY - Abnormal; Notable for the following:    Glucose-Capillary 223 (*)  All other components within normal limits  TROPONIN I  URINE RAPID DRUG SCREEN (HOSP PERFORMED)  TROPONIN I  TROPONIN I  GLUCOSE, CAPILLARY  HEPARIN LEVEL (UNFRACTIONATED)  CBC  COMPREHENSIVE METABOLIC PANEL  URINE CULTURE   Dg Chest 2 View  08/31/2012  *RADIOLOGY REPORT*  Clinical Data: Shortness of breath.  Chest pain.  CHEST - 2 VIEW  Comparison: 04/23/2012  Findings: Normal heart size and pulmonary vascularity. Emphysematous changes and scattered fibrosis in the lungs.  No focal airspace consolidation.  No blunting of costophrenic angles. No pneumothorax.  Mediastinal contours appear intact.  Calcified and tortuous aorta. Old left rib fractures.  Degenerative changes in the spine.  No significant change since previous study.  IMPRESSION: Emphysematous changes.  No evidence of active pulmonary disease.   Original Report Authenticated By: Marlon Pel, M.D.    Ct Head Wo Contrast  09/01/2012  *RADIOLOGY REPORT*  Clinical Data: Chest pain.  Altered mental status.  CT HEAD WITHOUT CONTRAST  Technique:  Contiguous axial images were obtained from the base of the skull through the vertex without contrast.  Comparison: 12/12/2003  Findings: Diffuse cerebral atrophy.  Encephalomalacia in the posterior temporal and parietal region consistent with old infarct, stable since previous study.  Nothing to suggest acute infarct.  No mass effect or midline shift.   No abnormal extra-axial fluid collections.  Gray-white matter junctions are mostly distinct. Basal cisterns are not effaced.  No evidence of acute intracranial hemorrhage.  Partial opacification of the left maxillary antrum. No acute air-fluid levels.  Vascular calcifications.  No depressed skull fractures.  Study is technically limited due to motion artifact.  IMPRESSION: Diffuse atrophy.  Pole of the left temporoparietal infarct.  No acute intracranial abnormalities.  No significant change since previous study.   Original Report Authenticated By: Marlon Pel, M.D.      1. Atypical chest pain   2. SOB (shortness of breath)   3. Wheezing   4. Bipolar disorder, unspecified   5. Delirium   6. Elevated troponin   7. Type II or unspecified type diabetes mellitus without mention of complication, not stated as uncontrolled      Date: 09/01/2012  Rate: 103  Rhythm: sinus tachycardia  QRS Axis: normal  Intervals: normal  ST/T Wave abnormalities: nonspecific ST/T changes  Conduction Disutrbances:none  Narrative Interpretation: V3-V4 now inverted  Old EKG Reviewed: changes noted    MDM  12:07 AM 71 y.o. female w hx of HTN, COPD, DM, HLP pw left sided sharp cp that began approx 2 hours ago after eating. Pt notes pain has persisted and she also has mild sob and wheezing. Pt AFVSS here, comfortable on exam, dec air movement bilaterally w/ mild wheezing. Will get breathing tx, gi cocktail. CP sounds atypical, will get delta trop.   Pt feeling much better after breathing tx, denies cp.   Consulted Cards, they will eval pt.   Pt admitted to hospitalist d/t likely sundowning and delirium.   Clinical Impression 1. Atypical chest pain   2. SOB (shortness of breath)   3. Wheezing   4. Bipolar disorder, unspecified   5. Delirium   6. Elevated troponin   7. Type II or unspecified type diabetes mellitus without mention of complication, not stated as uncontrolled               Purvis Sheffield, MD 09/02/12 0007

## 2012-09-01 ENCOUNTER — Encounter (HOSPITAL_COMMUNITY): Payer: Self-pay | Admitting: Cardiology

## 2012-09-01 ENCOUNTER — Emergency Department (HOSPITAL_COMMUNITY): Payer: Medicare Other

## 2012-09-01 DIAGNOSIS — R0602 Shortness of breath: Secondary | ICD-10-CM | POA: Insufficient documentation

## 2012-09-01 DIAGNOSIS — R0789 Other chest pain: Secondary | ICD-10-CM

## 2012-09-01 DIAGNOSIS — F319 Bipolar disorder, unspecified: Secondary | ICD-10-CM

## 2012-09-01 DIAGNOSIS — R7989 Other specified abnormal findings of blood chemistry: Secondary | ICD-10-CM

## 2012-09-01 DIAGNOSIS — R404 Transient alteration of awareness: Secondary | ICD-10-CM

## 2012-09-01 DIAGNOSIS — R062 Wheezing: Secondary | ICD-10-CM | POA: Insufficient documentation

## 2012-09-01 DIAGNOSIS — R410 Disorientation, unspecified: Secondary | ICD-10-CM | POA: Diagnosis present

## 2012-09-01 DIAGNOSIS — E119 Type 2 diabetes mellitus without complications: Secondary | ICD-10-CM

## 2012-09-01 LAB — BASIC METABOLIC PANEL
CO2: 26 mEq/L (ref 19–32)
Calcium: 8.2 mg/dL — ABNORMAL LOW (ref 8.4–10.5)
Glucose, Bld: 245 mg/dL — ABNORMAL HIGH (ref 70–99)
Potassium: 4.5 mEq/L (ref 3.5–5.1)
Sodium: 140 mEq/L (ref 135–145)

## 2012-09-01 LAB — URINALYSIS, ROUTINE W REFLEX MICROSCOPIC
Ketones, ur: NEGATIVE mg/dL
Nitrite: NEGATIVE
Protein, ur: 30 mg/dL — AB
Specific Gravity, Urine: 1.02 (ref 1.005–1.030)
Urobilinogen, UA: 0.2 mg/dL (ref 0.0–1.0)

## 2012-09-01 LAB — GLUCOSE, CAPILLARY
Glucose-Capillary: 223 mg/dL — ABNORMAL HIGH (ref 70–99)
Glucose-Capillary: 84 mg/dL (ref 70–99)

## 2012-09-01 LAB — POCT I-STAT 3, ART BLOOD GAS (G3+)
Acid-Base Excess: 2 mmol/L (ref 0.0–2.0)
Bicarbonate: 22.1 mEq/L (ref 20.0–24.0)
O2 Saturation: 100 %
TCO2: 23 mmol/L (ref 0–100)
pCO2 arterial: 23.1 mmHg — ABNORMAL LOW (ref 35.0–45.0)
pO2, Arterial: 173 mmHg — ABNORMAL HIGH (ref 80.0–100.0)

## 2012-09-01 LAB — POCT I-STAT TROPONIN I: Troponin i, poc: 0.13 ng/mL (ref 0.00–0.08)

## 2012-09-01 LAB — CBC
Hemoglobin: 13.5 g/dL (ref 12.0–15.0)
MCV: 93.3 fL (ref 78.0–100.0)
Platelets: 187 10*3/uL (ref 150–400)
RBC: 4.45 MIL/uL (ref 3.87–5.11)
WBC: 15.7 10*3/uL — ABNORMAL HIGH (ref 4.0–10.5)

## 2012-09-01 LAB — TSH: TSH: 0.047 u[IU]/mL — ABNORMAL LOW (ref 0.350–4.500)

## 2012-09-01 LAB — URINE MICROSCOPIC-ADD ON

## 2012-09-01 LAB — RAPID URINE DRUG SCREEN, HOSP PERFORMED
Barbiturates: NOT DETECTED
Cocaine: NOT DETECTED

## 2012-09-01 LAB — HEPARIN LEVEL (UNFRACTIONATED): Heparin Unfractionated: <0.1 IU/mL — ABNORMAL LOW (ref 0.30–0.70)

## 2012-09-01 LAB — PROTIME-INR
INR: 1.18 (ref 0.00–1.49)
Prothrombin Time: 15.3 seconds — ABNORMAL HIGH (ref 11.6–15.2)

## 2012-09-01 LAB — TROPONIN I: Troponin I: <0.3 ng/mL (ref <0.30)

## 2012-09-01 MED ORDER — PANCRELIPASE (LIP-PROT-AMYL) 12000-38000 UNITS PO CPEP
1.0000 | ORAL_CAPSULE | Freq: Three times a day (TID) | ORAL | Status: DC
  Filled 2012-09-01 (×4): qty 1

## 2012-09-01 MED ORDER — METHYLPREDNISOLONE SODIUM SUCC 125 MG IJ SOLR
125.0000 mg | Freq: Once | INTRAMUSCULAR | Status: AC
  Administered 2012-09-01: 125 mg via INTRAVENOUS
  Filled 2012-09-01: qty 2

## 2012-09-01 MED ORDER — OXYCODONE HCL 5 MG PO TABS
5.0000 mg | ORAL_TABLET | ORAL | Status: DC | PRN
  Administered 2012-09-01: 5 mg via ORAL
  Filled 2012-09-01: qty 1

## 2012-09-01 MED ORDER — SIMVASTATIN 20 MG PO TABS
20.0000 mg | ORAL_TABLET | Freq: Every evening | ORAL | Status: DC
  Administered 2012-09-01 – 2012-09-02 (×2): 20 mg via ORAL
  Filled 2012-09-01 (×2): qty 1

## 2012-09-01 MED ORDER — DEXTROSE 5 % IV SOLN
1.0000 g | INTRAVENOUS | Status: DC
  Administered 2012-09-01 – 2012-09-02 (×2): 1 g via INTRAVENOUS
  Filled 2012-09-01 (×2): qty 10

## 2012-09-01 MED ORDER — ASPIRIN 81 MG PO CHEW
81.0000 mg | CHEWABLE_TABLET | Freq: Every day | ORAL | Status: DC
  Administered 2012-09-02: 81 mg via ORAL
  Filled 2012-09-01: qty 1

## 2012-09-01 MED ORDER — HEPARIN BOLUS VIA INFUSION
3500.0000 [IU] | Freq: Once | INTRAVENOUS | Status: AC
  Administered 2012-09-01: 3500 [IU] via INTRAVENOUS

## 2012-09-01 MED ORDER — IPRATROPIUM BROMIDE 0.02 % IN SOLN
0.5000 mg | Freq: Two times a day (BID) | RESPIRATORY_TRACT | Status: DC
  Administered 2012-09-01 – 2012-09-02 (×3): 0.5 mg via RESPIRATORY_TRACT
  Filled 2012-09-01 (×3): qty 2.5

## 2012-09-01 MED ORDER — PANCRELIPASE (LIP-PROT-AMYL) 12000-38000 UNITS PO CPEP
2.0000 | ORAL_CAPSULE | ORAL | Status: DC | PRN
  Filled 2012-09-01: qty 2

## 2012-09-01 MED ORDER — ONDANSETRON HCL 4 MG PO TABS: 4.0000 mg | ORAL_TABLET | Freq: Four times a day (QID) | ORAL | Status: DC | PRN

## 2012-09-01 MED ORDER — INSULIN ASPART 100 UNIT/ML ~~LOC~~ SOLN
0.0000 [IU] | Freq: Three times a day (TID) | SUBCUTANEOUS | Status: DC
  Administered 2012-09-01 (×3): 3 [IU] via SUBCUTANEOUS
  Administered 2012-09-02: 2 [IU] via SUBCUTANEOUS

## 2012-09-01 MED ORDER — ONDANSETRON HCL 4 MG/2ML IJ SOLN: 4.0000 mg | Freq: Four times a day (QID) | INTRAMUSCULAR | Status: DC | PRN

## 2012-09-01 MED ORDER — MULTIVITAMINS PO CAPS: 1.0000 | ORAL_CAPSULE | Freq: Every day | ORAL | Status: DC

## 2012-09-01 MED ORDER — PANCRELIPASE (LIP-PROT-AMYL) 12000-38000 UNITS PO CPEP
4.0000 | ORAL_CAPSULE | Freq: Three times a day (TID) | ORAL | Status: DC
  Administered 2012-09-01 – 2012-09-02 (×6): 4 via ORAL
  Filled 2012-09-01 (×7): qty 4

## 2012-09-01 MED ORDER — HALOPERIDOL LACTATE 5 MG/ML IJ SOLN
0.5000 mg | Freq: Four times a day (QID) | INTRAMUSCULAR | Status: DC | PRN
  Administered 2012-09-01: 0.5 mg via INTRAVENOUS
  Filled 2012-09-01: qty 0.1

## 2012-09-01 MED ORDER — HEPARIN (PORCINE) IN NACL 100-0.45 UNIT/ML-% IJ SOLN
1150.0000 [IU]/h | INTRAMUSCULAR | Status: DC
  Administered 2012-09-01: 750 [IU]/h via INTRAVENOUS
  Filled 2012-09-01 (×2): qty 250

## 2012-09-01 MED ORDER — LISINOPRIL 5 MG PO TABS
5.0000 mg | ORAL_TABLET | Freq: Every day | ORAL | Status: DC
  Administered 2012-09-01 – 2012-09-02 (×2): 5 mg via ORAL
  Filled 2012-09-01 (×2): qty 1

## 2012-09-01 MED ORDER — BUDESONIDE 0.25 MG/2ML IN SUSP
0.2500 mg | Freq: Two times a day (BID) | RESPIRATORY_TRACT | Status: DC
Start: 2012-09-01 — End: 2012-09-02
  Administered 2012-09-01 – 2012-09-02 (×3): 0.25 mg via RESPIRATORY_TRACT
  Filled 2012-09-01 (×7): qty 2

## 2012-09-01 MED ORDER — SODIUM CHLORIDE 0.9 % IJ SOLN
3.0000 mL | Freq: Two times a day (BID) | INTRAMUSCULAR | Status: DC
  Administered 2012-09-01 – 2012-09-02 (×2): 3 mL via INTRAVENOUS

## 2012-09-01 MED ORDER — HEPARIN BOLUS VIA INFUSION
3000.0000 [IU] | Freq: Once | INTRAVENOUS | Status: AC
  Administered 2012-09-01: 3000 [IU] via INTRAVENOUS
  Filled 2012-09-01: qty 3000

## 2012-09-01 MED ORDER — LEVOTHYROXINE SODIUM 175 MCG PO TABS
175.0000 ug | ORAL_TABLET | Freq: Every day | ORAL | Status: DC
  Administered 2012-09-01 – 2012-09-02 (×2): 175 ug via ORAL
  Filled 2012-09-01 (×3): qty 1

## 2012-09-01 MED ORDER — ASPIRIN 81 MG PO TABS: 81.0000 mg | ORAL_TABLET | Freq: Every day | ORAL | Status: DC

## 2012-09-01 MED ORDER — ASPIRIN 81 MG PO CHEW: 324.0000 mg | CHEWABLE_TABLET | Freq: Once | ORAL | Status: DC

## 2012-09-01 MED ORDER — IPRATROPIUM-ALBUTEROL 0.5-2.5 (3) MG/3ML IN SOLN: 3.0000 mL | Freq: Two times a day (BID) | RESPIRATORY_TRACT | Status: DC

## 2012-09-01 MED ORDER — ALBUTEROL SULFATE (5 MG/ML) 0.5% IN NEBU
2.5000 mg | INHALATION_SOLUTION | Freq: Two times a day (BID) | RESPIRATORY_TRACT | Status: DC
  Administered 2012-09-01 – 2012-09-02 (×3): 2.5 mg via RESPIRATORY_TRACT
  Filled 2012-09-01 (×3): qty 0.5

## 2012-09-01 MED ORDER — VITAMIN D3 25 MCG (1000 UNIT) PO TABS
5000.0000 [IU] | ORAL_TABLET | Freq: Every day | ORAL | Status: DC
  Administered 2012-09-01 – 2012-09-02 (×2): 5000 [IU] via ORAL
  Filled 2012-09-01 (×2): qty 5

## 2012-09-01 MED ORDER — SODIUM CHLORIDE 0.9 % IV SOLN: INTRAVENOUS | Status: DC

## 2012-09-01 MED ORDER — ADULT MULTIVITAMIN W/MINERALS CH
1.0000 | ORAL_TABLET | Freq: Every day | ORAL | Status: DC
  Administered 2012-09-01 – 2012-09-02 (×2): 1 via ORAL
  Filled 2012-09-01 (×2): qty 1

## 2012-09-01 MED ORDER — HEPARIN BOLUS VIA INFUSION
2000.0000 [IU] | Freq: Once | INTRAVENOUS | Status: DC
  Filled 2012-09-01: qty 2000

## 2012-09-01 NOTE — ED Notes (Signed)
Patient transported to CT 

## 2012-09-01 NOTE — Progress Notes (Signed)
ANTICOAGULATION CONSULT NOTE - Initial Consult  Pharmacy Consult for heparin Indication: chest pain/ACS  No Known Allergies  Patient Measurements:    Vital Signs: Temp: 98.5 F (36.9 C) (09/06 2021) Temp src: Oral (09/06 2021) BP: 109/79 mmHg (09/07 0233) Pulse Rate: 99  (09/07 0233)  Labs:  [name] 08/31/12 2151  HGB 13.9  HCT 41.8  PLT 209  APTT --  LABPROT --  INR --  HEPARINUNFRC --  CREATININE 0.44*  CKTOTAL --  CKMB --  TROPONINI <0.30    The CrCl is unknown because both a height and weight (above a minimum accepted value) are required for this calculation.   Medical History: Past Medical History  Diagnosis Date  . Hypertension   . COPD (chronic obstructive pulmonary disease)   . Diabetes mellitus   . Elevated cholesterol   . History of ETOH abuse   . Hypothyroid   . Cirrhosis, alcoholic   . Psychosis   . Lymphoma     T cell  . Stroke     Occipital 2004    Medications:  Scheduled:    . albuterol  10 mg/hr Nebulization Once  . aspirin  325 mg Oral STAT  . diphenhydrAMINE  25 mg Intravenous Once  . gi cocktail  30 mL Oral Once  . methylPREDNISolone (SOLU-MEDROL) injection  125 mg Intravenous Once  . sodium chloride  500 mL Intravenous STAT  . DISCONTD: aspirin  324 mg Oral Once    Assessment: 71 yo female admitted with chest pain. Pharmacy consulted to manage heparin.   Goal of Therapy:  Heparin level 0.3-0.7 units/ml Monitor platelets by anticoagulation protocol: Yes   Plan:  1. Heparin 3500 unit IV bolus x 1, then IV infusion of 750 units/hr.  2. Heparin level in 8 hours.  3. Daily CBC, heparin level.   [name] 09/01/2012,3:51 AM

## 2012-09-01 NOTE — ED Notes (Signed)
Cardiology MD at bedside.

## 2012-09-01 NOTE — Progress Notes (Signed)
ANTICOAGULATION CONSULT NOTE - Follow Up Consult  Pharmacy Consult for Heparin Indication: chest pain/ACS  No Known Allergies  Patient Measurements: Height: 5\' 4"  (162.6 cm) Weight: 128 lb 14.4 oz (58.469 kg) IBW/kg (Calculated) : 54.7  Heparin Dosing Weight: 58 kg  Vital Signs: Temp: 98.1 F (36.7 C) (09/07 0637) BP: 102/58 mmHg (09/07 0637) Pulse Rate: 99  (09/07 0637)  Labs:  Basename 09/01/12 1310 09/01/12 1309 09/01/12 0902 09/01/12 0435 09/01/12 0402 08/31/12 2151  HGB -- -- -- 13.5 -- 13.9  HCT -- -- -- 41.5 -- 41.8  PLT -- -- -- 187 -- 209  APTT -- -- -- -- -- --  LABPROT -- -- -- -- 15.3* --  INR -- -- -- -- 1.18 --  HEPARINUNFRC -- <0.10* -- -- -- --  CREATININE -- -- -- 0.56 -- 0.44*  CKTOTAL -- -- -- -- -- --  CKMB -- -- -- -- -- --  TROPONINI <0.30 -- <0.30 -- -- <0.30    Estimated Creatinine Clearance: 55.7 ml/min (by C-G formula based on Cr of 0.56).  Assessment:  Initial heparin level undetectable on 750 units/hr.  No known IV infusion problems.  Enzymes negative.  Goal of Therapy:  Heparin level 0.3-0.7 units/ml Monitor platelets by anticoagulation protocol: Yes   Plan:   Re-bolus with Heparin 3000 units IV.  Increase heparin to 950 units/hr.  Next heparin level in ~ 6 hrs.  Dennie Fetters, [place] Pager: [phone] 09/01/2012,2:44 PM

## 2012-09-01 NOTE — ED Notes (Addendum)
Pt is alert to name only, pt has rashes throughout her body, pt denies chest pain, sob and pain/ pt demonstrate some confusion/ pt continued to ask for a cigarette/ oriented pt to location and situation/ pt boyfriend is at the bedside.

## 2012-09-01 NOTE — ED Notes (Signed)
Called and spoke with admitting doctor about ordering ABG and med for pts agitation/ Admitting Dr Fransisca Kaufmann agreed for pt to continue to be transported to 2000 and placed orders. Reported to [facility] on 2000

## 2012-09-01 NOTE — Progress Notes (Signed)
ANTICOAGULATION CONSULT NOTE - Follow Up Consult  Pharmacy Consult for Heparin Indication: chest pain/ACS  No Known Allergies  Patient Measurements: Height: 5\' 4"  (162.6 cm) Weight: 128 lb 14.4 oz (58.469 kg) IBW/kg (Calculated) : 54.7  Heparin Dosing Weight: 58 kg  Vital Signs: Temp: 98.4 F (36.9 C) (09/07 2011) Temp src: Oral (09/07 2011) BP: 97/57 mmHg (09/07 2011) Pulse Rate: 88  (09/07 2011)  Labs:  Basename 09/01/12 2026 09/01/12 1310 09/01/12 1309 09/01/12 0902 09/01/12 0435 09/01/12 0402 08/31/12 2151  HGB -- -- -- -- 13.5 -- 13.9  HCT -- -- -- -- 41.5 -- 41.8  PLT -- -- -- -- 187 -- 209  APTT -- -- -- -- -- -- --  LABPROT -- -- -- -- -- 15.3* --  INR -- -- -- -- -- 1.18 --  HEPARINUNFRC <0.10* -- <0.10* -- -- -- --  CREATININE -- -- -- -- 0.56 -- 0.44*  CKTOTAL -- -- -- -- -- -- --  CKMB -- -- -- -- -- -- --  TROPONINI -- <0.30 -- <0.30 -- -- <0.30    Estimated Creatinine Clearance: 55.7 ml/min (by C-G formula based on Cr of 0.56).  Assessment: 71 y/o female with chest pain receiving heparin for r/o MI. Xa level remains subtherapeutic, will bolus and increase gtt rate.  Goal of Therapy:  Heparin level 0.3-0.7 units/ml Monitor platelets by anticoagulation protocol: Yes   Plan:   Re-bolus with Heparin 2000 units IV.  Increase heparin to 1150 units/hr.  Next heparin level in 8 hours  Verlene Mayer, PharmD, [place] Pager [phone] 09/01/2012,10:20 PM

## 2012-09-01 NOTE — Progress Notes (Addendum)
[facility] PROGRESS NOTE  [name] ZOX:[id number] DOB: [birth year] DOA: 08/31/2012 PCP: Maryelizabeth Rowan, MD  Assessment/Plan: Active Problems:  HYPOTHYROIDISM  DIABETES MELLITUS, TYPE II  DYSLIPIDEMIA  CEREBROVASCULAR ACCIDENT, HX OF  Atypical chest pain  Elevated troponin  Delirium  1. Chest pain: Resolved. No acute changes on EKG. Single point of care troponin was elevated but subsequent to troponins have been negative. Discuss with cardiology regarding discontinuing heparin infusion. Continue aspirin. 2. Delirium: Unclear etiology but seems to have improved/resolved. Long discussion with patient's long-time friend Mr. Arvid Right who is known patient for greater than 20 years and indicates that her mental status is almost back to the baseline. She does have history of psychosis and bipolar disorder. Ammonia level minimally elevated. No focal deficits on exam. TSH is low-probably appropriately from Synthroid. UDS negative. CT head without acute findings. 3. 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 ABG suggestive of Respiratory alkalosis- ? 2/2 anxiety. 4. Hypothyroidism: Continue levothyroxin. 5. Type 2 diabetes mellitus: Continue sliding scale insulin. 6. Dyslipidemia: Continue simvastatin. 7. History of CVA: Continue aspirin. 8. Possible UTI: Urine culture and empiric Rocephin pending results. 9. Tobacco abuse: Cessation counseled. 10. History of psychosis and bipolar disorder: Does not seem to be on any maintenance medications for these. Denies delusions, hallucinations, suicidal or homicidal ideations. Monitor. If has active symptoms and we'll consider psychiatric consultation.  Code Status: Full Family Communication: Discussed with the patient's boyfriend Mr. Drue Stager Dye at bedside, updated care and answered questions. Disposition Plan: Home in stable.   Brief narrative: 71 y.o. female, with  past medical history of hypertension, hyperlipidemia, diabetes mellitus, CVA, bipolar affective disorder, psychosis, presents with complaints of chest pain, patient lives home by herself, where she had family friend visiting, where she reported to him having chest pain over 2 hours, where he gave her 2 baby aspirin, and when chest pain didn't resolve in 2 hours he called EMS, patient wasn't seen and evaluated by cardiology, she had some mild EKG changes, and she had her troponin mildly elevated, so she was started empirically on aspirin and heparin pending her workup, in ED patient appeared to be confused, noncompliant, and wasn't answering her questions appropriately,   Consultants:  [facility].  Procedures:  None.  Antibiotics:  Rocephin  HPI/Subjective: Patient is not a good historian. She denies any further chest pain. She denies dyspnea.? Urinary frequency but no dysuria. According to her boyfriend who was at the bedside, her mental status is almost at baseline. According to her she is not compliant with her home oxygen. She continues to smoke.  Objective: Filed Vitals:   09/01/12 0637 09/01/12 0847 09/01/12 1337 09/01/12 1455  BP: 102/58   98/49  Pulse: 99   98  Temp: 98.1 F (36.7 C)   98.1 F (36.7 C)  TempSrc:    Oral  Resp: 28   20  Height:   5\' 4"  (1.626 m)   Weight: 58.469 kg (128 lb 14.4 oz)     SpO2: 100% 99%  100%    Intake/Output Summary (Last 24 hours) at 09/01/12 1724 Last data filed at 09/01/12 1300  Gross per 24 hour  Intake    980 ml  Output      0 ml  Net    980 ml   Filed Weights   09/01/12 0637  Weight: 58.469 kg (128 lb 14.4 oz)    Exam:   General exam: Kindly built  and nourished female patient, sitting up at age of bed and trying to eat lunch. Slightly tremulous. No obvious distress.  Respiratory system: Distant breath sounds but clear to auscultation. No increased work of breathing.  Cardiovascular system: S1 and S2 heard, regular  rate and rhythm. No JVD, murmurs or pedal edema. Telemetry shows sinus rhythm in the 90s to sinus tachycardia in the low 100s.  Gastrointestinal system: Abdomen is nondistended soft and nontender. Normal bowel sounds heard.  Extremities: Symmetric 5 x 5 power. Slightly tremulous upper extremities. No asterixis.  Skin: Chronic reddish rash on lower extremities.  Data Reviewed: Basic Metabolic Panel:  Lab 09/01/12 1610 08/31/12 2151  NA 140 139  K 4.5 3.8  CL 106 102  CO2 26 27  GLUCOSE 245* 191*  BUN 12 12  CREATININE 0.56 0.44*  CALCIUM 8.2* 9.0  MG -- --  PHOS -- --   Liver Function Tests: No results found for this basename: AST:5,ALT:5,ALKPHOS:5,BILITOT:5,PROT:5,ALBUMIN:5 in the last 168 hours No results found for this basename: LIPASE:5,AMYLASE:5 in the last 168 hours  Lab 09/01/12 1309  AMMONIA 61*   CBC:  Lab 09/01/12 0435 08/31/12 2151  WBC 15.7* 15.4*  NEUTROABS -- --  HGB 13.5 13.9  HCT 41.5 41.8  MCV 93.3 91.5  PLT 187 209   Cardiac Enzymes:  Lab 09/01/12 1310 09/01/12 0902 08/31/12 2151  CKTOTAL -- -- --  CKMB -- -- --  CKMBINDEX -- -- --  TROPONINI <0.30 <0.30 <0.30   BNP (last 3 results) No results found for this basename: PROBNP:3 in the last 8760 hours CBG:  Lab 09/01/12 1550 09/01/12 1114 09/01/12 0644  GLUCAP 223* 239* 249*    No results found for this or any previous visit (from the past 240 hour(s)).   Studies: Dg Chest 2 View  08/31/2012  *RADIOLOGY REPORT*  Clinical Data: Shortness of breath.  Chest pain.  CHEST - 2 VIEW  Comparison: 04/23/2012  Findings: Normal heart size and pulmonary vascularity. Emphysematous changes and scattered fibrosis in the lungs.  No focal airspace consolidation.  No blunting of costophrenic angles. No pneumothorax.  Mediastinal contours appear intact.  Calcified and tortuous aorta. Old left rib fractures.  Degenerative changes in the spine.  No significant change since previous study.  IMPRESSION:  Emphysematous changes.  No evidence of active pulmonary disease.   Original Report Authenticated By: Marlon Pel, M.D.    Ct Head Wo Contrast  09/01/2012  *RADIOLOGY REPORT*  Clinical Data: Chest pain.  Altered mental status.  CT HEAD WITHOUT CONTRAST  Technique:  Contiguous axial images were obtained from the base of the skull through the vertex without contrast.  Comparison: 12/12/2003  Findings: Diffuse cerebral atrophy.  Encephalomalacia in the posterior temporal and parietal region consistent with old infarct, stable since previous study.  Nothing to suggest acute infarct.  No mass effect or midline shift.  No abnormal extra-axial fluid collections.  Gray-white matter junctions are mostly distinct. Basal cisterns are not effaced.  No evidence of acute intracranial hemorrhage.  Partial opacification of the left maxillary antrum. No acute air-fluid levels.  Vascular calcifications.  No depressed skull fractures.  Study is technically limited due to motion artifact.  IMPRESSION: Diffuse atrophy.  Pole of the left temporoparietal infarct.  No acute intracranial abnormalities.  No significant change since previous study.   Original Report Authenticated By: Marlon Pel, M.D.     Scheduled Meds:    . albuterol  2.5 mg Nebulization BID   And  .  ipratropium  0.5 mg Nebulization BID  . albuterol  10 mg/hr Nebulization Once  . aspirin  81 mg Oral Daily  . aspirin  325 mg Oral STAT  . budesonide  0.25 mg Nebulization BID  . cefTRIAXone (ROCEPHIN)  IV  1 g Intravenous Q24H  . cholecalciferol  5,000 Units Oral Daily  . diphenhydrAMINE  25 mg Intravenous Once  . gi cocktail  30 mL Oral Once  . heparin  3,000 Units Intravenous Once  . heparin  3,500 Units Intravenous Once  . insulin aspart  0-9 Units Subcutaneous TID WC  . levothyroxine  175 mcg Oral Q breakfast  . lipase/protease/amylase  4 capsule Oral TID AC  . lisinopril  5 mg Oral Daily  . methylPREDNISolone (SOLU-MEDROL) injection   125 mg Intravenous Once  . multivitamin with minerals  1 tablet Oral Daily  . simvastatin  20 mg Oral QPM  . sodium chloride  500 mL Intravenous STAT  . sodium chloride  3 mL Intravenous Q12H  . DISCONTD: aspirin  324 mg Oral Once  . DISCONTD: aspirin  81 mg Oral Daily  . DISCONTD: ipratropium-albuterol  3 mL Nebulization BID  . DISCONTD: lipase/protease/amylase  1 capsule Oral TID AC  . DISCONTD: multivitamin  1 capsule Oral Daily   Continuous Infusions:    . heparin 950 Units/hr (09/01/12 1444)  . DISCONTD: sodium chloride      Time spent: 35 minutes    [facility]  [facility] Pager [phone]. If 8PM-8AM, please contact night-coverage at [email or web address], password [facility] 09/01/2012, 5:24 PM  LOS: 1 day

## 2012-09-01 NOTE — Consult Note (Signed)
CARDIOLOGY ADMISSION NOTE  Patient ID: RONNICA DREESE MRN: [id number] DOB/AGE: May 07, 1941 71 y.o.  Admit date: 08/31/2012 Primary Physician   Maryelizabeth Rowan, MD Primary Cardiologist   None Chief Complaint    Chest pain  HPI: The patient has no history of CAD.  She did have an echo in 2004 with normal EF and no other significant abnormalities.  She had atypical chest pain in 2006 with a negative Adenosine Myoview.  She came to the emergency room via EMS today.  Her boyfriend said she was getting ready to go to bed he complained about some chest discomfort. He indicated the worse over her mid chest. He said it lasted about 20 minutes. He gave her 2 aspirin and it persisted. In the emergency room she was thought to be having a COPD flare. She was treated with albuterol with improvement. It's not clear that she was describing chest pain at that time. Her troponins did come back slightly elevated at 0.13. Her EKG demonstrated no acute changes. Apparently upon presentation she was quite clear mentally. However, when I came to see her she was confused and talking unintelligibly. She was not able to give any history. She was moving all extremities. Neuro exam seems to be nonfocal. 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 Her boyfriend says that this type of episode might occasionally happen to her when she is fatigued for her COPD is flaring.    Past Medical History  Diagnosis Date  . Hypertension   . COPD (chronic obstructive pulmonary disease)   . Diabetes mellitus   . Elevated cholesterol   . History of ETOH abuse   . Hypothyroid   . Cirrhosis, alcoholic   . Psychosis   . Lymphoma     T cell  . Stroke     Occipital 2004    Surgical history The patient denies any surgeries and I cannot find any in the files.  No Known Allergies No current facility-administered medications on file prior to encounter.   Current  Outpatient Prescriptions on File Prior to Encounter  Medication Sig Dispense Refill  . aspirin 81 MG tablet Take 81 mg by mouth daily.      . budesonide (PULMICORT) 0.25 MG/2ML nebulizer solution Take 0.25 mg by nebulization 2 (two) times daily.      . cholecalciferol (VITAMIN D) 1000 UNITS tablet Take 5,000 Units by mouth daily.      Marland Kitchen ipratropium-albuterol (DUONEB) 0.5-2.5 (3) MG/3ML SOLN Take 3 mLs by nebulization 2 (two) times daily.      Marland Kitchen levothyroxine (SYNTHROID, LEVOTHROID) 175 MCG tablet Take 175 mcg by mouth daily.      Marland Kitchen lisinopril (PRINIVIL,ZESTRIL) 5 MG tablet Take 5 mg by mouth daily.      . metFORMIN (GLUCOPHAGE) 1000 MG tablet Take 1,000 mg by mouth 2 (two) times daily with a meal.      . metFORMIN (GLUCOPHAGE) 500 MG tablet Take 500 mg by mouth daily.      . Multiple Vitamin (MULTIVITAMIN) capsule Take 1 capsule by mouth daily.      . Pancrelipase, Lip-Prot-Amyl, (ZENPEP) 20000 UNITS CPEP 2 capsules with meals and 1 capsule with snacks      . simvastatin (ZOCOR) 20 MG tablet Take 20 mg by mouth every evening.       History   Social History  . Marital Status: Widowed    Spouse Name: N/A    Number  of Children: 0  . Years of Education: N/A   Occupational History  . Retired    Social History Main Topics  . Smoking status: Current Everyday Smoker -- 0.5 packs/day for 56 years    Types: Cigarettes  . Smokeless tobacco: Never Used  . Alcohol Use: No     Sober since 1993  . Drug Use: No  . Sexually Active: Not on file   Other Topics Concern  . Not on file   Social History Narrative  . No narrative on file    Family history unobtainable  ROS:  Unobtainable in  Physical Exam: Blood pressure 90/53, pulse 91, temperature 98.5 F (36.9 C), temperature source Oral, resp. rate 26, SpO2 99.00%.  GENERAL:  Frail appearing HEENT:  Pupils equal round and reactive, fundi not visualized, oral mucosa unremarkable NECK:  No jugular venous distention, waveform within normal  limits, carotid upstroke brisk and symmetric, no bruits, no thyromegaly LYMPHATICS:  No cervical, inguinal adenopathy LUNGS:  Decreased breath sounds with expiratory wheezing BACK:  No CVA tenderness CHEST:  Unremarkable HEART:  PMI not displaced or sustained,S1 and S2 within normal limits, no S3, no S4, no clicks, no rubs, no murmurs, very distant heart sounds. ABD:  Flat, positive bowel sounds normal in frequency in pitch, no bruits, no rebound, no guarding, no midline pulsatile mass, no hepatomegaly, no splenomegaly EXT:  2 plus pulses throughout, no edema, no cyanosis no clubbing SKIN:  Diffuse lower extremity erythema , no nodules NEURO:  Moves all extremities but unable to answer questions PSYCH:  Confused.  Labs: Lab Results  Component Value Date   BUN 12 08/31/2012   Lab Results  Component Value Date   CREATININE 0.44* 08/31/2012   Lab Results  Component Value Date   NA 139 08/31/2012   K 3.8 08/31/2012   CL 102 08/31/2012   CO2 27 08/31/2012   Lab Results  Component Value Date   TROPONINI <0.30 08/31/2012   Lab Results  Component Value Date   WBC 15.4* 08/31/2012   HGB 13.9 08/31/2012   HCT 41.8 08/31/2012   MCV 91.5 08/31/2012   PLT 209 08/31/2012     Radiology:  CXR:  Emphysematous changes. No evidence of active pulmonary disease.  EKG:  Sinus tachycardia, rate 103, axis within normal limits, intervals within normal limits, premature atrial contractions, poor anterior R wave progression 09/01/2012  ASSESSMENT AND PLAN:    Chest pain: I was called for chest pain.  However, with her current mental status I cannot get a first person account of this.  I would cycle enzymes.  Heparin and ASA while checking enzymes. Pending her mental status and enzymes we can consider a possible non invasive work up.  Cath if she has EKG changes and an obvious enzyme trend upward and her mental status clears.    AMS:   This is currently the biggest concern.  I have discussed this with the ER doctor who  will reevaluate in the light of this change.  COPD: Continue previous therapy.   Avoid beta blockers with active wheezing.  HTN: Continue previous meds  ETOH: She has this history and did have one drink tonight per her boyfriend.  Encourage complete abstinence.    Tobacco abuse: Continue to educate.  SignedRollene Rotunda 09/01/2012, 2:27 AM

## 2012-09-01 NOTE — ED Notes (Signed)
RT Note: RT unable to obtain ABG due to pt being very combative. RN and MD aware.

## 2012-09-01 NOTE — H&P (Signed)
[facility]                                                                                    Patient Demographics  [name], is a 71 y.o. female  CSN: [id number]  MRN: [id number]  DOB - September 27, 1941  Admit Date - 08/31/2012  Outpatient Primary MD for the patient is Maryelizabeth Rowan, MD   With History of -  Past Medical History  Diagnosis Date  . Hypertension   . COPD (chronic obstructive pulmonary disease)   . Diabetes mellitus   . Elevated cholesterol   . History of ETOH abuse   . Hypothyroid   . Cirrhosis, alcoholic   . Psychosis   . Lymphoma     T cell  . Stroke     Occipital 2004      No past surgical history on file.  in for   Chief Complaint  Patient presents with  . Chest Pain     HPI  [name]  is a 71 y.o. female, with past medical history of hypertension, hyperlipidemia, diabetes mellitus, CVA, bipolar affective disorder, psychosis, presents with complaints of chest pain, patient lives home by herself, where she had family friend visiting, where she reported to him having chest pain over 2 hours, where he gave her 2 baby aspirin, and when chest pain didn't resolve in 2 hours he called EMS, patient wasn't seen and evaluated by cardiology, she had some mild EKG changes, and she had her troponin mildly elevated, so she was started empirically on aspirin and heparin pending her workup, in ED patient appeared to be confused, noncompliant, and wasn't answering her questions appropriately, so ED requested hospitalist service to admit the patient for further management of her mental status, patient is very poor historian, even though she is awake alert x3, but her answers seems to be in appropriate, upon questioning health when he reports this is her baseline , and this is how she is behaving usually, as well there was ED staff who know her from before during a previous ED visit and reports this is her usual behavior, by reviewing her past  medical history it appears she has history of psychosis, as well history of bipolar affective disorder, patient currently denies any chest pain, denies any fever cough shortness of breath, productive sputum, nausea vomiting.    Review of Systems    In addition to the HPI above, No Fever-chills, No Headache, No changes with Vision or hearing, No problems swallowing food or Liquids, Had complaints of Chest pain and she reports is currently resolved, Cough or Shortness of Breath, No Abdominal pain, No Nausea or Vommitting, Bowel movements are regular, No Blood in stool or Urine, No dysuria, No new skin rashes or bruises, No new joints pains-aches,  No new weakness, tingling, numbness in any extremity, No recent weight gain or loss, No polyuria, polydypsia or polyphagia, No significant Mental Stressors.  A full 10 point Review of Systems was done, except as stated above, all other Review of Systems were negative.   Social History History  Substance Use Topics  . Smoking status: Current Everyday Smoker -- 0.5 packs/day for 56 years  Types: Cigarettes  . Smokeless tobacco: Never Used  . Alcohol Use: No     Sober since 1993     Family History No family history on file.   Prior to Admission medications   Medication Sig Start Date End Date Taking? Authorizing Provider  aspirin 81 MG tablet Take 81 mg by mouth daily.   Yes Historical Provider, MD  budesonide (PULMICORT) 0.25 MG/2ML nebulizer solution Take 0.25 mg by nebulization 2 (two) times daily.   Yes Historical Provider, MD  cholecalciferol (VITAMIN D) 1000 UNITS tablet Take 5,000 Units by mouth daily.   Yes Historical Provider, MD  ipratropium-albuterol (DUONEB) 0.5-2.5 (3) MG/3ML SOLN Take 3 mLs by nebulization 2 (two) times daily.   Yes Historical Provider, MD  levothyroxine (SYNTHROID, LEVOTHROID) 175 MCG tablet Take 175 mcg by mouth daily.   Yes Historical Provider, MD  lisinopril (PRINIVIL,ZESTRIL) 5 MG tablet Take 5  mg by mouth daily.   Yes Historical Provider, MD  metFORMIN (GLUCOPHAGE) 1000 MG tablet Take 1,000 mg by mouth 2 (two) times daily with a meal.   Yes Historical Provider, MD  metFORMIN (GLUCOPHAGE) 500 MG tablet Take 500 mg by mouth daily.   Yes Historical Provider, MD  Multiple Vitamin (MULTIVITAMIN) capsule Take 1 capsule by mouth daily.   Yes Historical Provider, MD  Pancrelipase, Lip-Prot-Amyl, (ZENPEP) 20000 UNITS CPEP 2 capsules with meals and 1 capsule with snacks   Yes Historical Provider, MD  simvastatin (ZOCOR) 20 MG tablet Take 20 mg by mouth every evening.   Yes Historical Provider, MD    No Known Allergies  Physical Exam  Vitals  Blood pressure 94/52, pulse 99, temperature 98.5 F (36.9 C), temperature source Oral, resp. rate 29, SpO2 98.00%.   1. General elderly lying in bed in NAD,    2. patient is awake, answers appropriately to name ,place, and date, but most of her answers are inappropriate,   3. No F.N deficits, ALL C.Nerves Intact, Strength 5/5 all 4 extremities, Sensation intact all 4 extremities, Plantars down going.  4. Ears and Eyes appear Normal, Conjunctivae clear, PERRLA. Moist Oral Mucosa.  5. Supple Neck, No JVD, No cervical lymphadenopathy appriciated, No Carotid Bruits.  6. Symmetrical Chest wall movement, Good air movement bilaterally, CTAB.  7. RRR, No Gallops, Rubs or Murmurs, No Parasternal Heave.  8. Positive Bowel Sounds, Abdomen Soft, Non tender, No organomegaly appriciated,No rebound -guarding or rigidity.  9.  No Cyanosis,, No Skin Rash or Bruise, patient has a skin rash, which appears to be chronic, patient reports its been there for a while.  10. Good muscle tone,  joints appear normal , no effusions, Normal ROM.  11. No Palpable Lymph Nodes in Neck or Axillae    Data Review  CBC  Lab 08/31/12 2151  WBC 15.4*  HGB 13.9  HCT 41.8  PLT 209  MCV 91.5  MCH 30.4  MCHC 33.3  RDW 13.8  LYMPHSABS --  MONOABS --  EOSABS --    BASOSABS --  BANDABS --   ------------------------------------------------------------------------------------------------------------------  Chemistries   Lab 08/31/12 2151  NA 139  K 3.8  CL 102  CO2 27  GLUCOSE 191*  BUN 12  CREATININE 0.44*  CALCIUM 9.0  MG --  AST --  ALT --  ALKPHOS --  BILITOT --   ------------------------------------------------------------------------------------------------------------------ CrCl is unknown because both a height and weight (above a minimum accepted value) are required for this calculation. ------------------------------------------------------------------------------------------------------------------ No results found for this basename: TSH,T4TOTAL,FREET3,T3FREE,THYROIDAB in the last 72  hours   Coagulation profile No results found for this basename: INR:5,PROTIME:5 in the last 168 hours ------------------------------------------------------------------------------------------------------------------- No results found for this basename: DDIMER:2 in the last 72 hours -------------------------------------------------------------------------------------------------------------------  Cardiac Enzymes  Lab 08/31/12 2151  CKMB --  TROPONINI <0.30  MYOGLOBIN --   ------------------------------------------------------------------------------------------------------------------ No components found with this basename: POCBNP:3   ---------------------------------------------------------------------------------------------------------------  Urinalysis    Component Value Date/Time   COLORURINE AMBER* 09/01/2012 0346   APPEARANCEUR TURBID* 09/01/2012 0346   LABSPEC 1.020 09/01/2012 0346   PHURINE 5.0 09/01/2012 0346   GLUCOSEU NEGATIVE 09/01/2012 0346   HGBUR TRACE* 09/01/2012 0346   BILIRUBINUR SMALL* 09/01/2012 0346   KETONESUR NEGATIVE 09/01/2012 0346   PROTEINUR 30* 09/01/2012 0346   UROBILINOGEN 0.2 09/01/2012 0346   NITRITE NEGATIVE  09/01/2012 0346   LEUKOCYTESUR LARGE* 09/01/2012 0346    ----------------------------------------------------------------------------------------------------------------    Imaging results:   Dg Chest 2 View  08/31/2012  *RADIOLOGY REPORT*  Clinical Data: Shortness of breath.  Chest pain.  CHEST - 2 VIEW  Comparison: 04/23/2012  Findings: Normal heart size and pulmonary vascularity. Emphysematous changes and scattered fibrosis in the lungs.  No focal airspace consolidation.  No blunting of costophrenic angles. No pneumothorax.  Mediastinal contours appear intact.  Calcified and tortuous aorta. Old left rib fractures.  Degenerative changes in the spine.  No significant change since previous study.  IMPRESSION: Emphysematous changes.  No evidence of active pulmonary disease.   Original Report Authenticated By: Marlon Pel, M.D.    Ct Head Wo Contrast  09/01/2012  *RADIOLOGY REPORT*  Clinical Data: Chest pain.  Altered mental status.  CT HEAD WITHOUT CONTRAST  Technique:  Contiguous axial images were obtained from the base of the skull through the vertex without contrast.  Comparison: 12/12/2003  Findings: Diffuse cerebral atrophy.  Encephalomalacia in the posterior temporal and parietal region consistent with old infarct, stable since previous study.  Nothing to suggest acute infarct.  No mass effect or midline shift.  No abnormal extra-axial fluid collections.  Gray-white matter junctions are mostly distinct. Basal cisterns are not effaced.  No evidence of acute intracranial hemorrhage.  Partial opacification of the left maxillary antrum. No acute air-fluid levels.  Vascular calcifications.  No depressed skull fractures.  Study is technically limited due to motion artifact.  IMPRESSION: Diffuse atrophy.  Pole of the left temporoparietal infarct.  No acute intracranial abnormalities.  No significant change since previous study.   Original Report Authenticated By: Marlon Pel, M.D.       Assessment & Plan  Active Problems:  Atypical chest pain  Elevated troponin  Delirium  HYPOTHYROIDISM  DIABETES MELLITUS, TYPE II  DYSLIPIDEMIA  CEREBROVASCULAR ACCIDENT, HX OF    1. atypical chest pain with elevated troponin, cardiology consult is appreciated, patient was started on aspirin and heparin drip , were will continue cycling troponins and follow the trend, and management will be by cardiology.  2. Altered mental status/delerium: Etiology is unclear, but more or less this appears to be around her baseline, especially given her history of psychosis and bipolar affective disorder, and this is nothing new has per her her friend, but we'll still check a TSH, and given the fact he has leukocytosis we'll check urinalysis, as well will check urine for drugs of abuse, there is no history of alcohol abuse, reporting average one drink per week.  3. Hypothyroidism, we'll continue with Synthroid and check TSH  4. Diabetes mellitus, will hold oral hypoglycemic agents and continue with insulin sliding scale  5. Dyslipidemia, continue with statin  6. History of CVA, continue with aspirin and statin   DVT Prophylaxis currently on heparin AM Labs Ordered, also please review Full Orders  Family Communication: Admission, patients condition and plan of care including tests being ordered have been discussed with the patient and her friend Jesusita Oka [id number](AOZH her permission) who indicate understanding and agree with the plan and Code Status.  Code Status Full code  Disposition Plan: home  Time spent in minutes : 50 min  Condition GUARDED  Randol Kern, Sephora Boyar M.D on 09/01/2012 at 4:28 AM   [facility] Office  [phone]

## 2012-09-02 DIAGNOSIS — J9610 Chronic respiratory failure, unspecified whether with hypoxia or hypercapnia: Secondary | ICD-10-CM

## 2012-09-02 DIAGNOSIS — E039 Hypothyroidism, unspecified: Secondary | ICD-10-CM

## 2012-09-02 DIAGNOSIS — J449 Chronic obstructive pulmonary disease, unspecified: Secondary | ICD-10-CM

## 2012-09-02 LAB — GLUCOSE, CAPILLARY
Glucose-Capillary: 183 mg/dL — ABNORMAL HIGH (ref 70–99)
Glucose-Capillary: 101 mg/dL — ABNORMAL HIGH (ref 70–99)

## 2012-09-02 LAB — COMPREHENSIVE METABOLIC PANEL
ALT: 21 U/L (ref 0–35)
AST: 16 U/L (ref 0–37)
Albumin: 2.7 g/dL — ABNORMAL LOW (ref 3.5–5.2)
CO2: 29 mEq/L (ref 19–32)
Chloride: 106 mEq/L (ref 96–112)
Creatinine, Ser: 0.52 mg/dL (ref 0.50–1.10)
GFR calc non Af Amer: >90 mL/min (ref >90)
Potassium: 4.3 mEq/L (ref 3.5–5.1)
Sodium: 141 mEq/L (ref 135–145)
Total Bilirubin: 0.2 mg/dL — ABNORMAL LOW (ref 0.3–1.2)

## 2012-09-02 LAB — TROPONIN I: Troponin I: <0.3 ng/mL (ref <0.30)

## 2012-09-02 MED ORDER — SODIUM CHLORIDE 0.9 % IV BOLUS (SEPSIS)
500.0000 mL | Freq: Once | INTRAVENOUS | Status: AC
  Administered 2012-09-02: 500 mL via INTRAVENOUS

## 2012-09-02 MED ORDER — ENOXAPARIN SODIUM 40 MG/0.4ML ~~LOC~~ SOLN
40.0000 mg | SUBCUTANEOUS | Status: DC
  Administered 2012-09-02: 40 mg via SUBCUTANEOUS
  Filled 2012-09-02: qty 0.4

## 2012-09-02 MED ORDER — PNEUMOCOCCAL VAC POLYVALENT 25 MCG/0.5ML IJ INJ: 0.5000 mL | INJECTION | INTRAMUSCULAR | Status: DC

## 2012-09-02 MED ORDER — CEFUROXIME AXETIL 500 MG PO TABS: 500.0000 mg | ORAL_TABLET | Freq: Two times a day (BID) | ORAL | Status: AC

## 2012-09-02 NOTE — Progress Notes (Signed)
09/02/2012 7:34 PM Nursing note Attempted to review discharge AVS form with patient and fiancee. Pt. Refused teaching. Pt. And husband were belligerent with staff and insisted on leaving without AVS. Pt. Encouraged to stay. Pt. Only allowed RN to write next date due of medications on AVS, not time. Pt. would not allow RN to review. Pt. Walked off unit, refusing AVS form. RN caught up with patient, pt. Took form and walked away with fiancee. Pt refused all teaching, review of medications, follow up appointments, rx, as well as importance of using home oxygen. Iv line had been d/c prior. Pt. Walked off unit d/c home per orders with fiancee.  Kaylyn Lim, Cariah Salatino Larita Fife [place]li[place], [place]

## 2012-09-02 NOTE — Consult Note (Signed)
Reason for Consult: AMS Referring Physician: unknown  TABBETHA KUTSCHER is an 71 y.o. female.  HPI: Lizzete Gough is a 71 y.o. female, with past medical history of hypertension, hyperlipidemia, diabetes mellitus, CVA, bipolar affective disorder, admitted with complaints of chest pain.   patient lives home by herself, where she had family friend visiting, where she reported to him having chest pain over 2 hours, where he gave her 2 baby aspirin, and when chest pain didn't resolve in 2 hours he called EMS, patient wasn't seen and evaluated by cardiology, she had some mild EKG changes, and she had her troponin mildly elevated, so she was started empirically on aspirin and heparin pending her workup, in ED patient appeared to be confused, noncompliant, and wasn't answering her questions appropriately, so ED requested hospitalist service to admit the patient for further management of her mental status.  At that time per chart patient was very poor historian, even though she is awake alert x3, but her answers seems to be in appropriate, upon questioning health when he reports this is her baseline.   Seen today. Per pt she was confused before admission but not sure why. Denies any psychotic, anxiety or depressive or manic symptoms now. Reports hx of of depression in past. Reports she was diagnosed with bipolar d/o in past but denies any manic episodes in past. Denies any recent drugs or ETOH. Reports taking care of her home and cooking with out any problems. Per pt she lives with her boy friend now and plans to marry him soon.    Past Medical History  Diagnosis Date  . Hypertension   . COPD (chronic obstructive pulmonary disease)   . Diabetes mellitus   . Elevated cholesterol   . History of ETOH abuse   . Hypothyroid   . Cirrhosis, alcoholic   . Psychosis   . Lymphoma     T cell  . Stroke     Occipital 2004  . Asthma     History reviewed. No pertinent past surgical history.  Family  History  Problem Relation Age of Onset  . Family history unknown: Yes    Social History:  reports that she has been smoking Cigarettes.  She has a 28 pack-year smoking history. She has never used smokeless tobacco. She reports that she does not drink alcohol or use illicit drugs.  Allergies: No Known Allergies  Medications: I have reviewed the patient's current medications.  Results for orders placed during the hospital encounter of 08/31/12 (from the past 48 hour(s))  TROPONIN I     Status: Normal   Collection Time   08/31/12  9:51 PM      Component Value Range Comment   Troponin I <0.30  <0.30 ng/mL   CBC     Status: Abnormal   Collection Time   08/31/12  9:51 PM      Component Value Range Comment   WBC 15.4 (*) 4.0 - 10.5 K/uL    RBC 4.57  3.87 - 5.11 MIL/uL    Hemoglobin 13.9  12.0 - 15.0 g/dL    HCT 96.0  45.4 - 09.8 %    MCV 91.5  78.0 - 100.0 fL    MCH 30.4  26.0 - 34.0 pg    MCHC 33.3  30.0 - 36.0 g/dL    RDW 11.9  14.7 - 82.9 %    Platelets 209  150 - 400 K/uL   BASIC METABOLIC PANEL     Status: Abnormal  Collection Time   08/31/12  9:51 PM      Component Value Range Comment   Sodium 139  135 - 145 mEq/L    Potassium 3.8  3.5 - 5.1 mEq/L    Chloride 102  96 - 112 mEq/L    CO2 27  19 - 32 mEq/L    Glucose, Bld 191 (*) 70 - 99 mg/dL    BUN 12  6 - 23 mg/dL    Creatinine, Ser 7.82 (*) 0.50 - 1.10 mg/dL    Calcium 9.0  8.4 - 95.6 mg/dL    GFR calc non Af Amer >90  >90 mL/min    GFR calc Af Amer >90  >90 mL/min   POCT I-STAT TROPONIN I     Status: Abnormal   Collection Time   09/01/12  1:38 AM      Component Value Range Comment   Troponin i, poc 0.13 (*) 0.00 - 0.08 ng/mL    Comment NOTIFIED PHYSICIAN      Comment 3            URINALYSIS, ROUTINE W REFLEX MICROSCOPIC     Status: Abnormal   Collection Time   09/01/12  3:46 AM      Component Value Range Comment   Color, Urine AMBER (*) YELLOW BIOCHEMICALS MAY BE AFFECTED BY COLOR   APPearance TURBID (*) CLEAR     Specific Gravity, Urine 1.020  1.005 - 1.030    pH 5.0  5.0 - 8.0    Glucose, UA NEGATIVE  NEGATIVE mg/dL    Hgb urine dipstick TRACE (*) NEGATIVE    Bilirubin Urine SMALL (*) NEGATIVE    Ketones, ur NEGATIVE  NEGATIVE mg/dL    Protein, ur 30 (*) NEGATIVE mg/dL    Urobilinogen, UA 0.2  0.0 - 1.0 mg/dL    Nitrite NEGATIVE  NEGATIVE    Leukocytes, UA LARGE (*) NEGATIVE   URINE MICROSCOPIC-ADD ON     Status: Abnormal   Collection Time   09/01/12  3:46 AM      Component Value Range Comment   Squamous Epithelial / LPF MANY (*) RARE    WBC, UA TOO NUMEROUS TO COUNT  <3 WBC/hpf    RBC / HPF 0-2  <3 RBC/hpf    Bacteria, UA MANY (*) RARE   URINE RAPID DRUG SCREEN (HOSP PERFORMED)     Status: Normal   Collection Time   09/01/12  3:53 AM      Component Value Range Comment   Opiates NONE DETECTED  NONE DETECTED    Cocaine NONE DETECTED  NONE DETECTED    Benzodiazepines NONE DETECTED  NONE DETECTED    Amphetamines NONE DETECTED  NONE DETECTED    Tetrahydrocannabinol NONE DETECTED  NONE DETECTED    Barbiturates NONE DETECTED  NONE DETECTED   PROTIME-INR     Status: Abnormal   Collection Time   09/01/12  4:02 AM      Component Value Range Comment   Prothrombin Time 15.3 (*) 11.6 - 15.2 seconds    INR 1.18  0.00 - 1.49   BASIC METABOLIC PANEL     Status: Abnormal   Collection Time   09/01/12  4:35 AM      Component Value Range Comment   Sodium 140  135 - 145 mEq/L    Potassium 4.5  3.5 - 5.1 mEq/L    Chloride 106  96 - 112 mEq/L    CO2 26  19 - 32 mEq/L  Glucose, Bld 245 (*) 70 - 99 mg/dL    BUN 12  6 - 23 mg/dL    Creatinine, Ser 9.60  0.50 - 1.10 mg/dL    Calcium 8.2 (*) 8.4 - 10.5 mg/dL    GFR calc non Af Amer >90  >90 mL/min    GFR calc Af Amer >90  >90 mL/min   CBC     Status: Abnormal   Collection Time   09/01/12  4:35 AM      Component Value Range Comment   WBC 15.7 (*) 4.0 - 10.5 K/uL    RBC 4.45  3.87 - 5.11 MIL/uL    Hemoglobin 13.5  12.0 - 15.0 g/dL    HCT 45.4  09.8 - 11.9  %    MCV 93.3  78.0 - 100.0 fL    MCH 30.3  26.0 - 34.0 pg    MCHC 32.5  30.0 - 36.0 g/dL    RDW 14.7  82.9 - 56.2 %    Platelets 187  150 - 400 K/uL   TSH     Status: Abnormal   Collection Time   09/01/12  4:35 AM      Component Value Range Comment   TSH 0.047 (*) 0.350 - 4.500 uIU/mL   GLUCOSE, CAPILLARY     Status: Abnormal   Collection Time   09/01/12  6:44 AM      Component Value Range Comment   Glucose-Capillary 249 (*) 70 - 99 mg/dL   TROPONIN I     Status: Normal   Collection Time   09/01/12  9:02 AM      Component Value Range Comment   Troponin I <0.30  <0.30 ng/mL   GLUCOSE, CAPILLARY     Status: Abnormal   Collection Time   09/01/12 11:14 AM      Component Value Range Comment   Glucose-Capillary 239 (*) 70 - 99 mg/dL    Comment 1 Notify RN     POCT I-STAT 3, BLOOD GAS (G3+)     Status: Abnormal   Collection Time   09/01/12  1:05 PM      Component Value Range Comment   pH, Arterial 7.588 (*) 7.350 - 7.450    pCO2 arterial 23.1 (*) 35.0 - 45.0 mmHg    pO2, Arterial 173.0 (*) 80.0 - 100.0 mmHg    Bicarbonate 22.1  20.0 - 24.0 mEq/L    TCO2 23  0 - 100 mmol/L    O2 Saturation 100.0      Acid-Base Excess 2.0  0.0 - 2.0 mmol/L    Patient temperature 98.6 F      Collection site RADIAL, ALLEN'S TEST ACCEPTABLE      Drawn by Operator      Sample type ARTERIAL     HEPARIN LEVEL (UNFRACTIONATED)     Status: Abnormal   Collection Time   09/01/12  1:09 PM      Component Value Range Comment   Heparin Unfractionated <0.10 (*) 0.30 - 0.70 IU/mL   AMMONIA     Status: Abnormal   Collection Time   09/01/12  1:09 PM      Component Value Range Comment   Ammonia 61 (*) 11 - 60 umol/L   TROPONIN I     Status: Normal   Collection Time   09/01/12  1:10 PM      Component Value Range Comment   Troponin I <0.30  <0.30 ng/mL   GLUCOSE, CAPILLARY     Status:  Abnormal   Collection Time   09/01/12  3:50 PM      Component Value Range Comment   Glucose-Capillary 223 (*) 70 - 99 mg/dL    Comment  1 Notify RN     HEPARIN LEVEL (UNFRACTIONATED)     Status: Abnormal   Collection Time   09/01/12  8:26 PM      Component Value Range Comment   Heparin Unfractionated <0.10 (*) 0.30 - 0.70 IU/mL   GLUCOSE, CAPILLARY     Status: Normal   Collection Time   09/01/12  9:04 PM      Component Value Range Comment   Glucose-Capillary 84  70 - 99 mg/dL    Comment 1 Documented in Chart      Comment 2 Notify RN     GLUCOSE, CAPILLARY     Status: Abnormal   Collection Time   09/02/12  6:16 AM      Component Value Range Comment   Glucose-Capillary 116 (*) 70 - 99 mg/dL    Comment 1 Documented in Chart      Comment 2 Notify RN     COMPREHENSIVE METABOLIC PANEL     Status: Abnormal   Collection Time   09/02/12  7:25 AM      Component Value Range Comment   Sodium 141  135 - 145 mEq/L    Potassium 4.3  3.5 - 5.1 mEq/L    Chloride 106  96 - 112 mEq/L    CO2 29  19 - 32 mEq/L    Glucose, Bld 142 (*) 70 - 99 mg/dL    BUN 12  6 - 23 mg/dL    Creatinine, Ser 4.09  0.50 - 1.10 mg/dL    Calcium 8.5  8.4 - 81.1 mg/dL    Total Protein 6.2  6.0 - 8.3 g/dL    Albumin 2.7 (*) 3.5 - 5.2 g/dL    AST 16  0 - 37 U/L    ALT 21  0 - 35 U/L    Alkaline Phosphatase 45  39 - 117 U/L    Total Bilirubin 0.2 (*) 0.3 - 1.2 mg/dL    GFR calc non Af Amer >90  >90 mL/min    GFR calc Af Amer >90  >90 mL/min   GLUCOSE, CAPILLARY     Status: Abnormal   Collection Time   09/02/12 11:10 AM      Component Value Range Comment   Glucose-Capillary 183 (*) 70 - 99 mg/dL    Comment 1 Documented in Chart      Comment 2 Notify RN       Dg Chest 2 View  08/31/2012  *RADIOLOGY REPORT*  Clinical Data: Shortness of breath.  Chest pain.  CHEST - 2 VIEW  Comparison: 04/23/2012  Findings: Normal heart size and pulmonary vascularity. Emphysematous changes and scattered fibrosis in the lungs.  No focal airspace consolidation.  No blunting of costophrenic angles. No pneumothorax.  Mediastinal contours appear intact.  Calcified and tortuous  aorta. Old left rib fractures.  Degenerative changes in the spine.  No significant change since previous study.  IMPRESSION: Emphysematous changes.  No evidence of active pulmonary disease.   Original Report Authenticated By: Marlon Pel, M.D.    Ct Head Wo Contrast  09/01/2012  *RADIOLOGY REPORT*  Clinical Data: Chest pain.  Altered mental status.  CT HEAD WITHOUT CONTRAST  Technique:  Contiguous axial images were obtained from the base of the skull through the vertex without contrast.  Comparison: 12/12/2003  Findings: Diffuse cerebral atrophy.  Encephalomalacia in the posterior temporal and parietal region consistent with old infarct, stable since previous study.  Nothing to suggest acute infarct.  No mass effect or midline shift.  No abnormal extra-axial fluid collections.  Gray-white matter junctions are mostly distinct. Basal cisterns are not effaced.  No evidence of acute intracranial hemorrhage.  Partial opacification of the left maxillary antrum. No acute air-fluid levels.  Vascular calcifications.  No depressed skull fractures.  Study is technically limited due to motion artifact.  IMPRESSION: Diffuse atrophy.  Pole of the left temporoparietal infarct.  No acute intracranial abnormalities.  No significant change since previous study.   Original Report Authenticated By: Marlon Pel, M.D.     ROS Blood pressure 111/53, pulse 97, temperature 97.7 F (36.5 C), temperature source Oral, resp. rate 18, height 5\' 4"  (1.626 m), weight 58.469 kg (128 lb 14.4 oz), SpO2 97.00%. Physical Exam  alert on bed  Mental Status Examination/Evaluation:  Appearance: on bed  Eye Contact:: Good  Speech: normal  Volume: Normal  Mood: fine  Affect: broad  Thought Process: organized  Orientation: Full  Thought Content: NO AVH  Suicidal Thoughts: No  Homicidal Thoughts: no  Memory: Recent; Poor  Judgement: fair  Insight:fair  Psychomotor Activity: Normal  Concentration:  Fair  Recall: Fair  Akathisia: No  Assessment:  AXIS I: Delirium (resolved)  AXIS II: Deferred  AXIS III: see mdical hx ? ?  ? ?  ?  ? ?  ? ?  ?  AXIS IV: unknown  AXIS V: 55  ?  Treatment Plan/Recommendations:   1. Pt currently appears to be at her baseline. Pt denies any dep, manic or psychotic symptoms.  2. Will recommend to f/u on low TSH and UDS  2. Will sign off now. Will call us again if needed   Wonda Cerise 09/02/2012, 2:50 PM

## 2012-09-02 NOTE — Progress Notes (Signed)
Late entry for 09/01/12- Dr. Waymon Amato wanted [facility] cardiology notified that last 2 enzymes have been negative and if it was ok to d/c heparin drip.  Joni Reining on-call was notified and stated to d/c heparin drip and start Lovenox for VTE 1 mg/kg.

## 2012-09-02 NOTE — Progress Notes (Signed)
Subjective: Breathing is stable  No CP. Objective: Filed Vitals:   09/02/12 0410 09/02/12 0413 09/02/12 0557 09/02/12 0843  BP: 87/55 82/46 92/57    Pulse:      Temp: 98.3 F (36.8 C)     TempSrc: Oral     Resp: 19     Height:      Weight:      SpO2: 99%   97%   Weight change:   Intake/Output Summary (Last 24 hours) at 09/02/12 0953 Last data filed at 09/02/12 0600  Gross per 24 hour  Intake    480 ml  Output   1200 ml  Net   -720 ml    General: Patient is in NAD Neck:  JVP is normal Heart: Regular rate and rhythm, without murmurs, rubs, gallops.  Lungs: Rhonchi.  Decreased airflow. Exemities:  No edema.   Neuro: Tangential thoughts.    Tele:  SR Lab Results: Results for orders placed during the hospital encounter of 08/31/12 (from the past 24 hour(s))  GLUCOSE, CAPILLARY     Status: Abnormal   Collection Time   09/01/12 11:14 AM      Component Value Range   Glucose-Capillary 239 (*) 70 - 99 mg/dL   Comment 1 Notify RN    POCT I-STAT 3, BLOOD GAS (G3+)     Status: Abnormal   Collection Time   09/01/12  1:05 PM      Component Value Range   pH, Arterial 7.588 (*) 7.350 - 7.450   pCO2 arterial 23.1 (*) 35.0 - 45.0 mmHg   pO2, Arterial 173.0 (*) 80.0 - 100.0 mmHg   Bicarbonate 22.1  20.0 - 24.0 mEq/L   TCO2 23  0 - 100 mmol/L   O2 Saturation 100.0     Acid-Base Excess 2.0  0.0 - 2.0 mmol/L   Patient temperature 98.6 F     Collection site RADIAL, ALLEN'S TEST ACCEPTABLE     Drawn by Operator     Sample type ARTERIAL    HEPARIN LEVEL (UNFRACTIONATED)     Status: Abnormal   Collection Time   09/01/12  1:09 PM      Component Value Range   Heparin Unfractionated <0.10 (*) 0.30 - 0.70 IU/mL  AMMONIA     Status: Abnormal   Collection Time   09/01/12  1:09 PM      Component Value Range   Ammonia 61 (*) 11 - 60 umol/L  TROPONIN I     Status: Normal   Collection Time   09/01/12  1:10 PM      Component Value Range   Troponin I <0.30  <0.30 ng/mL  GLUCOSE, CAPILLARY      Status: Abnormal   Collection Time   09/01/12  3:50 PM      Component Value Range   Glucose-Capillary 223 (*) 70 - 99 mg/dL   Comment 1 Notify RN    HEPARIN LEVEL (UNFRACTIONATED)     Status: Abnormal   Collection Time   09/01/12  8:26 PM      Component Value Range   Heparin Unfractionated <0.10 (*) 0.30 - 0.70 IU/mL  GLUCOSE, CAPILLARY     Status: Normal   Collection Time   09/01/12  9:04 PM      Component Value Range   Glucose-Capillary 84  70 - 99 mg/dL   Comment 1 Documented in Chart     Comment 2 Notify RN    GLUCOSE, CAPILLARY     Status: Abnormal  Collection Time   09/02/12  6:16 AM      Component Value Range   Glucose-Capillary 116 (*) 70 - 99 mg/dL   Comment 1 Documented in Chart     Comment 2 Notify RN    COMPREHENSIVE METABOLIC PANEL     Status: Abnormal   Collection Time   09/02/12  7:25 AM      Component Value Range   Sodium 141  135 - 145 mEq/L   Potassium 4.3  3.5 - 5.1 mEq/L   Chloride 106  96 - 112 mEq/L   CO2 29  19 - 32 mEq/L   Glucose, Bld 142 (*) 70 - 99 mg/dL   BUN 12  6 - 23 mg/dL   Creatinine, Ser 9.52  0.50 - 1.10 mg/dL   Calcium 8.5  8.4 - 84.1 mg/dL   Total Protein 6.2  6.0 - 8.3 g/dL   Albumin 2.7 (*) 3.5 - 5.2 g/dL   AST 16  0 - 37 U/L   ALT 21  0 - 35 U/L   Alkaline Phosphatase 45  39 - 117 U/L   Total Bilirubin 0.2 (*) 0.3 - 1.2 mg/dL   GFR calc non Af Amer >90  >90 mL/min   GFR calc Af Amer >90  >90 mL/min    Studies/Results: @RISRSLT24 @  Medications: REviewed.}   Patient Active Hospital Problem List:  1.  CP  Resolved.  She says she was excited at time that what explained it.  Will check one additional troponin but otherwise appears to have r/o for MI.  For now I would not pursue any furhter work up.  2.  COnfusion.  Persiists.  Friend says that is her baseline.  Psych has been called  3.  Tob.  Counselled on quitting.  4.  Hypertension.  Bp is on low side  I would d/c lisinopril.  Follow as outpatient  5.  HL  Cont  meds.   Dietrich Pates 09/02/2012, 9:53 AM

## 2012-09-02 NOTE — Progress Notes (Signed)
Patient O2 sat 88% on room air. 90% on 2lit of oxygen upon ambulation. And between 92% and 94% on 2l of oxygen at rest. She states she uses home oxygen prn.  09/02/12 1700 09/02/12 1701 09/02/12 1702  Oxygen Therapy  SpO2 ! 88 % 92 % 90 %  O2 Device None (Room air) (nurse notified) Nasal cannula Nasal cannula  O2 Flow Rate (L/min) --  2 L/min 2 L/min
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
## 2012-09-02 NOTE — Discharge Summary (Signed)
Physician Discharge Summary  MAXENE BYINGTON ZOX:[id number] DOB: 21-Dec-1941 DOA: 08/31/2012  PCP: Maryelizabeth Rowan, MD  Admit date: 08/31/2012 Discharge date: 09/02/2012  Recommendations for Outpatient Follow-up:  1. Advised to followup with primary medical doctor in one week from hospital discharge. 2. Advised to followup with primary psychiatrist.  Discharge Diagnoses:  Active Problems:  HYPOTHYROIDISM  DIABETES MELLITUS, TYPE II  DYSLIPIDEMIA  CEREBROVASCULAR ACCIDENT, HX OF  Atypical chest pain  Elevated troponin  Delirium   Discharge Condition: Improved and stable.  Diet recommendation: Heart healthy diet.  Filed Weights   09/01/12 0637  Weight: 58.469 kg (128 lb 14.4 oz)    History of present illness:  71 y.o. female, with past medical history of hypertension, hyperlipidemia, diabetes mellitus, CVA, bipolar affective disorder, psychosis, presents with complaints of chest pain, patient lives home by herself, where she had family friend visiting, where she reported to him having chest pain over 2 hours, where he gave her 2 baby aspirin, and when chest pain didn't resolve in 2 hours he called EMS, patient wasn't seen and evaluated by cardiology, she had some mild EKG changes, and she had her troponin mildly elevated, so she was started empirically on aspirin and heparin pending her workup, in ED patient appeared to be confused, noncompliant, and wasn't answering her questions appropriately,   Hospital Course:  1. Chest pain: Resolved. No acute changes on EKG. Single point of care troponin was elevated but subsequent to troponins have been negative. Cardiology does not recommend any further workup. Continue aspirin. Lisinopril was discontinued secondary to hypotension. 2. Delirium: Unclear etiology but seems to have improved/resolved. Long discussion with patient's long-time friend Mr. Arvid Right who has known patient for greater than 20 years and indicates that her mental  status is almost back to the baseline. She does have history of psychosis and bipolar disorder. Ammonia level minimally elevated. No focal deficits on exam. TSH is low-probably appropriately from Synthroid. UDS negative. CT head without acute findings. Discussed with patient's fianc today who has known her for approximately a month and indicates that patient's mental status is baseline. According to him, She does have 1-2 episodes daily of confusion and at times will have grandiose ideations-wishing to speak with the president. 3. 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 ABG suggestive of Respiratory alkalosis- ? 2/2 anxiety. 4. Hypothyroidism: Continue levothyroxin. Recommend repeating TSH, free T3 and free T4 as outpatient. Clinically euthyroid. 5. Type 2 diabetes mellitus: Continue home dose of metformin. 6. Dyslipidemia: Continue simvastatin. 7. History of CVA: Continue aspirin. 8. Possible UTI: Urine culture pending. Complete total 3 days of antibiotics. We'll switch to Ceftin on discharge. 9. Tobacco abuse: Cessation counseled. 10. History of psychosis and bipolar disorder: Does not seem to be on any maintenance medications for these. Denies delusions, hallucinations, suicidal or homicidal ideations. Psychiatry was consulted and did not have any further recommendations.   Procedures:  None  Consultations:  [facility] cardiology  Psychiatry  Discharge Exam:  Complaints Patient denies complaints and is eager to go home. She denies dyspnea or chest pain. She has flight of ideas and tangential responses to questions.  According to patient's fianc at bedside, her mental status is at baseline.  Filed Vitals:   09/02/12 1635 09/02/12 1700 09/02/12 1701 09/02/12 1702  BP:      Pulse:      Temp:      TempSrc:      Resp:  Height:      Weight:      SpO2: 98% 88% 92% 90%    General exam: Thin built and nourished female  patient, sitting up at age of bed comfortably. Respiratory system: Distant breath sounds but clear to auscultation. No increased work of breathing.  Cardiovascular system: S1 and S2 heard, regular rate and rhythm. No JVD, murmurs or pedal edema. Telemetry shows sinus rhythm.  Gastrointestinal system: Abdomen is nondistended soft and nontender. Normal bowel sounds heard.  Extremities: Symmetric 5 x 5 power. No tremulousness or asterixis Skin: Chronic reddish rash on lower extremities   Discharge Instructions  Discharge Orders    Future Orders Please Complete By Expires   Diet - low sodium heart healthy      Diet Carb Modified      Increase activity slowly      Discharge instructions      Comments:   Continue to use home oxygen via nasal cannula at 2 L per minute continuously.   Call MD for:  difficulty breathing, headache or visual disturbances      Call MD for:  severe uncontrolled pain        Medication List  As of 09/02/2012  7:35 PM   STOP taking these medications         lisinopril 5 MG tablet         TAKE these medications         aspirin 81 MG tablet   Take 81 mg by mouth daily.      budesonide 0.25 MG/2ML nebulizer solution   Commonly known as: PULMICORT   Take 0.25 mg by nebulization 2 (two) times daily.      cefUROXime 500 MG tablet   Commonly known as: CEFTIN   Take 1 tablet (500 mg total) by mouth 2 (two) times daily.      cholecalciferol 1000 UNITS tablet   Commonly known as: VITAMIN D   Take 5,000 Units by mouth daily.      ipratropium-albuterol 0.5-2.5 (3) MG/3ML Soln   Commonly known as: DUONEB   Take 3 mLs by nebulization 2 (two) times daily.      levothyroxine 175 MCG tablet   Commonly known as: SYNTHROID, LEVOTHROID   Take 175 mcg by mouth daily.      metFORMIN 1000 MG tablet   Commonly known as: GLUCOPHAGE   Take 1,000 mg by mouth 2 (two) times daily with a meal.      metFORMIN 500 MG tablet   Commonly known as: GLUCOPHAGE   Take 500 mg by  mouth daily.      multivitamin capsule   Take 1 capsule by mouth daily.      simvastatin 20 MG tablet   Commonly known as: ZOCOR   Take 20 mg by mouth every evening.      ZENPEP 20000 UNITS Cpep   Generic drug: Pancrelipase (Lip-Prot-Amyl)   2 capsules with meals and 1 capsule with snacks           Follow-up Information    Follow up with DEWEY,ELIZABETH, MD. Schedule an appointment as soon as possible for a visit in 1 week.   Contact information:   [address], [address] [place] [address] [phone]       Schedule an appointment as soon as possible for a visit with PLOVSKY, Joannie [place], MD.   Contact information:   [address] [place] [address] [phone]  The results of significant diagnostics from this hospitalization (including imaging, microbiology, ancillary and laboratory) are listed below for reference.    Significant Diagnostic Studies: Dg Chest 2 View  08/31/2012  *RADIOLOGY REPORT*  Clinical Data: Shortness of breath.  Chest pain.  CHEST - 2 VIEW  Comparison: 04/23/2012  Findings: Normal heart size and pulmonary vascularity. Emphysematous changes and scattered fibrosis in the lungs.  No focal airspace consolidation.  No blunting of costophrenic angles. No pneumothorax.  Mediastinal contours appear intact.  Calcified and tortuous aorta. Old left rib fractures.  Degenerative changes in the spine.  No significant change since previous study.  IMPRESSION: Emphysematous changes.  No evidence of active pulmonary disease.   Original Report Authenticated By: Marlon Pel, M.D.    Ct Head Wo Contrast  09/01/2012  *RADIOLOGY REPORT*  Clinical Data: Chest pain.  Altered mental status.  CT HEAD WITHOUT CONTRAST  Technique:  Contiguous axial images were obtained from the base of the skull through the vertex without contrast.  Comparison: 12/12/2003  Findings: Diffuse cerebral atrophy.  Encephalomalacia in the  posterior temporal and parietal region consistent with old infarct, stable since previous study.  Nothing to suggest acute infarct.  No mass effect or midline shift.  No abnormal extra-axial fluid collections.  Gray-white matter junctions are mostly distinct. Basal cisterns are not effaced.  No evidence of acute intracranial hemorrhage.  Partial opacification of the left maxillary antrum. No acute air-fluid levels.  Vascular calcifications.  No depressed skull fractures.  Study is technically limited due to motion artifact.  IMPRESSION: Diffuse atrophy.  Pole of the left temporoparietal infarct.  No acute intracranial abnormalities.  No significant change since previous study.   Original Report Authenticated By: Marlon Pel, M.D.     Microbiology: No results found for this or any previous visit (from the past 240 hour(s)).   Labs: Basic Metabolic Panel:  Lab 09/02/12 4098 09/01/12 0435 08/31/12 2151  NA 141 140 139  K 4.3 4.5 3.8  CL 106 106 102  CO2 29 26 27   GLUCOSE 142* 245* 191*  BUN 12 12 12   CREATININE 0.52 0.56 0.44*  CALCIUM 8.5 8.2* 9.0  MG -- -- --  PHOS -- -- --   Liver Function Tests:  Lab 09/02/12 0725  AST 16  ALT 21  ALKPHOS 45  BILITOT 0.2*  PROT 6.2  ALBUMIN 2.7*   No results found for this basename: LIPASE:5,AMYLASE:5 in the last 168 hours  Lab 09/01/12 1309  AMMONIA 61*   CBC:  Lab 09/01/12 0435 08/31/12 2151  WBC 15.7* 15.4*  NEUTROABS -- --  HGB 13.5 13.9  HCT 41.5 41.8  MCV 93.3 91.5  PLT 187 209   Cardiac Enzymes:  Lab 09/02/12 1601 09/01/12 1310 09/01/12 0902 08/31/12 2151  CKTOTAL -- -- -- --  CKMB -- -- -- --  CKMBINDEX -- -- -- --  TROPONINI <0.30 <0.30 <0.30 <0.30   BNP: BNP (last 3 results) No results found for this basename: PROBNP:3 in the last 8760 hours CBG:  Lab 09/02/12 1623 09/02/12 1110 09/02/12 0616 09/01/12 2104 09/01/12 1550  GLUCAP 101* 183* 116* 84 223*   Other lab data 1. ABG: PH 7.59, PCO2 23, PO2 173,  bicarbonate 22 and oxygen saturation 100%. 2. TSH: 0.047. 3. UA: too numerous to count white blood cells, many squamous epithelial cells and many bacteria. 4. UDS: Negative.   Time coordinating discharge: Less than 30 minutes  Signed:  Velma Hanna  [facility] 09/02/2012, 7:35  PM

## 2012-09-02 NOTE — Progress Notes (Signed)
Spoke with Theodore Demark NP, with [facility] Cardiology per Dr. Richardean Chimera request to clarify Lovenox dosing and she stated Lovenox 40 mg SQ was sufficient.

## 2012-09-03 LAB — URINE CULTURE

## 2012-09-06 ENCOUNTER — Emergency Department (HOSPITAL_COMMUNITY): Payer: Medicare Other

## 2012-09-06 ENCOUNTER — Encounter (HOSPITAL_COMMUNITY): Payer: Self-pay

## 2012-09-06 ENCOUNTER — Emergency Department (HOSPITAL_COMMUNITY)
Admission: EM | Admit: 2012-09-06 | Discharge: 2012-09-08 | Disposition: A | Discharge status: Other Acute Inpatient Hospital | Payer: Medicare Other | Attending: Emergency Medicine | Admitting: Emergency Medicine

## 2012-09-06 DIAGNOSIS — J4489 Other specified chronic obstructive pulmonary disease: Secondary | ICD-10-CM | POA: Insufficient documentation

## 2012-09-06 DIAGNOSIS — J449 Chronic obstructive pulmonary disease, unspecified: Secondary | ICD-10-CM | POA: Insufficient documentation

## 2012-09-06 DIAGNOSIS — R05 Cough: Secondary | ICD-10-CM | POA: Insufficient documentation

## 2012-09-06 DIAGNOSIS — R059 Cough, unspecified: Secondary | ICD-10-CM | POA: Insufficient documentation

## 2012-09-06 DIAGNOSIS — IMO0002 Reserved for concepts with insufficient information to code with codable children: Secondary | ICD-10-CM | POA: Insufficient documentation

## 2012-09-06 DIAGNOSIS — R451 Restlessness and agitation: Secondary | ICD-10-CM

## 2012-09-06 DIAGNOSIS — R4182 Altered mental status, unspecified: Secondary | ICD-10-CM | POA: Insufficient documentation

## 2012-09-06 DIAGNOSIS — F22 Delusional disorders: Secondary | ICD-10-CM | POA: Insufficient documentation

## 2012-09-06 LAB — BASIC METABOLIC PANEL
CO2: 34 mEq/L — ABNORMAL HIGH (ref 19–32)
Chloride: 95 mEq/L — ABNORMAL LOW (ref 96–112)
Potassium: 4.2 mEq/L (ref 3.5–5.1)
Sodium: 138 mEq/L (ref 135–145)

## 2012-09-06 LAB — URINALYSIS, ROUTINE W REFLEX MICROSCOPIC
Glucose, UA: NEGATIVE mg/dL
Hgb urine dipstick: NEGATIVE
Ketones, ur: NEGATIVE mg/dL
Leukocytes, UA: NEGATIVE
Protein, ur: NEGATIVE mg/dL
Urobilinogen, UA: 0.2 mg/dL (ref 0.0–1.0)

## 2012-09-06 LAB — CBC WITH DIFFERENTIAL/PLATELET
Basophils Absolute: 0 10*3/uL (ref 0.0–0.1)
Lymphocytes Relative: 34 % (ref 12–46)
Neutro Abs: 5.4 10*3/uL (ref 1.7–7.7)
Platelets: 236 10*3/uL (ref 150–400)
RDW: 13.7 % (ref 11.5–15.5)
WBC: 9.1 10*3/uL (ref 4.0–10.5)

## 2012-09-06 LAB — RAPID URINE DRUG SCREEN, HOSP PERFORMED
Amphetamines: NOT DETECTED
Tetrahydrocannabinol: NOT DETECTED

## 2012-09-06 LAB — TROPONIN I: Troponin I: <0.3 ng/mL (ref <0.30)

## 2012-09-06 LAB — ETHANOL: Alcohol, Ethyl (B): <11 mg/dL (ref 0–11)

## 2012-09-06 MED ORDER — LORAZEPAM 2 MG/ML IJ SOLN
1.0000 mg | Freq: Once | INTRAMUSCULAR | Status: AC
  Administered 2012-09-06: 1 mg via INTRAMUSCULAR
  Filled 2012-09-06: qty 1

## 2012-09-06 NOTE — ED Notes (Signed)
Attempted to call report. Receiving nurse to call back when available. 

## 2012-09-06 NOTE — ED Notes (Signed)
Pt's acuity changed to 2.  Pt is combative and is not cooperative.  Pt threw O2 tank on the floor.

## 2012-09-06 NOTE — ED Notes (Signed)
Dian Queen and Jobe Gibbon notified that the EDPA wanted one of them to come to the hospital to enlighten on patient's behavior.

## 2012-09-06 NOTE — ED Notes (Signed)
Patient refused to change into paper scrubs

## 2012-09-06 NOTE — ED Notes (Signed)
Pt states she came in due to SOB. Pt states she got married yesterday and does not know the number to reach her husband and states that he might be at his other house watching the football game. Pt asked to change into scrubs but pt refused stating that she didn't want any trouble because she already had bruises and she was comfortable with what she had on.
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
## 2012-09-06 NOTE — ED Provider Notes (Signed)
I saw and evaluated the patient, reviewed the resident's note and I agree with the findings and plan.  71 year old female with chest pain. Atypical in nature for ACS. Her EKG also does show some changes from prior with T wave inversions in V3 and V4 though. On exam she is in no acute distress. Mild tachypnea. Mild diffuse wheezing bilaterally. Decreased air movement. Likely secondary to COPD and breathing treatments for her. Given EKG changes and pain cardiology was consult. Will record admission.  Raeford Razor, MD 09/06/12 [phone]
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
## 2012-09-07 MED ORDER — IPRATROPIUM BROMIDE 0.02 % IN SOLN
0.5000 mg | Freq: Once | RESPIRATORY_TRACT | Status: AC
  Administered 2012-09-07: 0.5 mg via RESPIRATORY_TRACT
  Filled 2012-09-07: qty 2.5

## 2012-09-07 MED ORDER — ALBUTEROL SULFATE (5 MG/ML) 0.5% IN NEBU
INHALATION_SOLUTION | RESPIRATORY_TRACT | Status: AC
  Filled 2012-09-07: qty 0.5

## 2012-09-07 MED ORDER — LORAZEPAM 1 MG PO TABS
1.0000 mg | ORAL_TABLET | Freq: Three times a day (TID) | ORAL | Status: DC | PRN
  Administered 2012-09-07 – 2012-09-08 (×3): 1 mg via ORAL
  Filled 2012-09-07 (×3): qty 1

## 2012-09-07 MED ORDER — ALBUTEROL SULFATE (5 MG/ML) 0.5% IN NEBU
2.5000 mg | INHALATION_SOLUTION | RESPIRATORY_TRACT | Status: DC | PRN
  Administered 2012-09-07 – 2012-09-08 (×5): 2.5 mg via RESPIRATORY_TRACT
  Filled 2012-09-07 (×3): qty 0.5

## 2012-09-07 MED ORDER — ALBUTEROL SULFATE (5 MG/ML) 0.5% IN NEBU
5.0000 mg | INHALATION_SOLUTION | Freq: Once | RESPIRATORY_TRACT | Status: AC
  Administered 2012-09-07: 5 mg via RESPIRATORY_TRACT
  Filled 2012-09-07: qty 1
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
## 2012-09-07 NOTE — ED Notes (Signed)
Heather, RT at bedside.

## 2012-09-07 NOTE — ED Notes (Signed)
Heather, RT notified that pt requested breathing treatment. States she did not give one earlier due to pt sleeping. States she will come now to give treatment.

## 2012-09-07 NOTE — ED Notes (Signed)
Pt up to bathroom with O2 on. Pt became SOB when returning to bed. Asking for nebulizer.

## 2012-09-07 NOTE — ED Notes (Signed)
Heather, RT notified that pt requesting breathing treatment.

## 2012-09-07 NOTE — ED Notes (Signed)
RT at bedside.

## 2012-09-07 NOTE — ED Notes (Signed)
Pt states she needs a nebulizer treatment and is having expiratory wheezing. MD notified and order given for albuterol and atrovent

## 2012-09-07 NOTE — ED Notes (Signed)
EDP called for standing orders on patient.  Pt continues to repeat phrases, escalate and is derogatory towards staff and sitter.

## 2012-09-07 NOTE — ED Notes (Signed)
Pt stating "I'm ready to get out of here.Marland KitchenMarland KitchenI want a drink of liquor..I want a  Cigarette.Marland KitchenMarland KitchenI'm getting out of here!"

## 2012-09-08 LAB — URINE CULTURE
Colony Count: NO GROWTH
Culture: NO GROWTH

## 2012-09-08 NOTE — ED Notes (Signed)
Called report to Greggory Stallion, Charity fundraiser at [facility]; stated understanding and denied questions/concerns; awaiting transportation and paperwork at this time

## 2012-09-08 NOTE — BHH Counselor (Signed)
Counselor spoke with pt's medical POA, [name] [phone], to see if he would be able to transport pt if she were accepted to psychiatric hospital. Mr. [name] was under the assumption that she was going to be IVC'd due to him being worried that she may, "go crazy," if he were to try and transport. Counselor will request pt be IVC'd to keep pt safe.  Mr. [name] would like to be kept updated on pt's status. He requests that the TV in her room be turned to [facility] since she likes this station. Mr. [name] said to be careful about letting pt's reported fiance, [name] or [name]., see pt.   Counselor spoke with [facility] and [facility] and both hospitals are reviewing her info.   [employer], LPC

## 2012-09-08 NOTE — BHH Counselor (Addendum)
[facility] called and said pt was accepted. Due to Mr. Bartolo Darter, pt's Medical POA, request for pt to be at [facility] like she was in her previous hospitalization, counselor is calling [facility] to see if they are close to accepting pt. [facility] will call back Delorise Shiner).   9:25am Delorise Shiner from [facility] called to accept pt pending IVC paperwork faxed to them. IVC paperwork w/doctor's signature was sent to [facility] and [facility]. Counselor contacted [facility] and told them pt not coming due to acceptance at [facility] [phone]).  Delorise Shiner from [facility] called to accept pt, accepting physician Dr. Lowanda Foster, since they had received IVC paperwork. Pt's nurse notified and was given nurse report number: [phone]. 10:02am  Medical POA, Francisco Capuchin, contacted to let him know pt accepted at [facility]. Medical POA, Jobe Gibbon, left message for her w/o pt information left on answering machine.   [employer], LPC

## 2012-09-08 NOTE — ED Notes (Signed)
[employer] present to serve IVC papers to Pt

## 2012-09-08 NOTE — ED Notes (Signed)
Pt discharged to [facility] via [employer] office via [employer]; pt stable at time of discharge

## 2012-09-08 NOTE — ED Notes (Signed)
Writer went into chart to try to help ACT counselor locate an H&P for the facility that pt was transferred to as they contacted her stating that one was not sent with her to their facility. The most recent found was from the 6th and pt came in on the 12th.

## 2012-10-07 ENCOUNTER — Encounter (HOSPITAL_COMMUNITY): Payer: Self-pay | Admitting: Pulmonary Disease

## 2012-10-07 ENCOUNTER — Emergency Department (HOSPITAL_COMMUNITY): Payer: Medicare Other

## 2012-10-07 ENCOUNTER — Inpatient Hospital Stay (HOSPITAL_COMMUNITY)
Admission: EM | Admit: 2012-10-07 | Discharge: 2012-10-15 | DRG: 871 | Disposition: A | Discharge status: Skilled Nursing Facility | Payer: Medicare Other | Attending: Family Medicine | Admitting: Family Medicine

## 2012-10-07 ENCOUNTER — Inpatient Hospital Stay (HOSPITAL_COMMUNITY): Payer: Medicare Other

## 2012-10-07 DIAGNOSIS — F1020 Alcohol dependence, uncomplicated: Secondary | ICD-10-CM | POA: Diagnosis present

## 2012-10-07 DIAGNOSIS — J9610 Chronic respiratory failure, unspecified whether with hypoxia or hypercapnia: Secondary | ICD-10-CM

## 2012-10-07 DIAGNOSIS — E785 Hyperlipidemia, unspecified: Secondary | ICD-10-CM

## 2012-10-07 DIAGNOSIS — R404 Transient alteration of awareness: Secondary | ICD-10-CM

## 2012-10-07 DIAGNOSIS — J449 Chronic obstructive pulmonary disease, unspecified: Secondary | ICD-10-CM

## 2012-10-07 DIAGNOSIS — R410 Disorientation, unspecified: Secondary | ICD-10-CM

## 2012-10-07 DIAGNOSIS — I469 Cardiac arrest, cause unspecified: Secondary | ICD-10-CM

## 2012-10-07 DIAGNOSIS — F17200 Nicotine dependence, unspecified, uncomplicated: Secondary | ICD-10-CM

## 2012-10-07 DIAGNOSIS — E46 Unspecified protein-calorie malnutrition: Secondary | ICD-10-CM | POA: Diagnosis present

## 2012-10-07 DIAGNOSIS — R062 Wheezing: Secondary | ICD-10-CM

## 2012-10-07 DIAGNOSIS — Z8679 Personal history of other diseases of the circulatory system: Secondary | ICD-10-CM

## 2012-10-07 DIAGNOSIS — J9690 Respiratory failure, unspecified, unspecified whether with hypoxia or hypercapnia: Secondary | ICD-10-CM

## 2012-10-07 DIAGNOSIS — J9600 Acute respiratory failure, unspecified whether with hypoxia or hypercapnia: Secondary | ICD-10-CM | POA: Diagnosis present

## 2012-10-07 DIAGNOSIS — C8409 Mycosis fungoides, extranodal and solid organ sites: Secondary | ICD-10-CM

## 2012-10-07 DIAGNOSIS — E162 Hypoglycemia, unspecified: Secondary | ICD-10-CM

## 2012-10-07 DIAGNOSIS — R634 Abnormal weight loss: Secondary | ICD-10-CM

## 2012-10-07 DIAGNOSIS — I10 Essential (primary) hypertension: Secondary | ICD-10-CM | POA: Diagnosis present

## 2012-10-07 DIAGNOSIS — J4489 Other specified chronic obstructive pulmonary disease: Secondary | ICD-10-CM | POA: Diagnosis present

## 2012-10-07 DIAGNOSIS — E876 Hypokalemia: Secondary | ICD-10-CM | POA: Diagnosis present

## 2012-10-07 DIAGNOSIS — E1169 Type 2 diabetes mellitus with other specified complication: Secondary | ICD-10-CM | POA: Diagnosis present

## 2012-10-07 DIAGNOSIS — F319 Bipolar disorder, unspecified: Secondary | ICD-10-CM

## 2012-10-07 DIAGNOSIS — R680 Hypothermia, not associated with low environmental temperature: Secondary | ICD-10-CM | POA: Diagnosis present

## 2012-10-07 DIAGNOSIS — R58 Hemorrhage, not elsewhere classified: Secondary | ICD-10-CM | POA: Diagnosis present

## 2012-10-07 DIAGNOSIS — Z7982 Long term (current) use of aspirin: Secondary | ICD-10-CM

## 2012-10-07 DIAGNOSIS — E119 Type 2 diabetes mellitus without complications: Secondary | ICD-10-CM

## 2012-10-07 DIAGNOSIS — E039 Hypothyroidism, unspecified: Secondary | ICD-10-CM

## 2012-10-07 DIAGNOSIS — R7989 Other specified abnormal findings of blood chemistry: Secondary | ICD-10-CM

## 2012-10-07 DIAGNOSIS — A419 Sepsis, unspecified organism: Principal | ICD-10-CM

## 2012-10-07 DIAGNOSIS — Z8673 Personal history of transient ischemic attack (TIA), and cerebral infarction without residual deficits: Secondary | ICD-10-CM

## 2012-10-07 DIAGNOSIS — R0789 Other chest pain: Secondary | ICD-10-CM

## 2012-10-07 DIAGNOSIS — R6521 Severe sepsis with septic shock: Secondary | ICD-10-CM | POA: Diagnosis present

## 2012-10-07 DIAGNOSIS — R0602 Shortness of breath: Secondary | ICD-10-CM

## 2012-10-07 DIAGNOSIS — K7030 Alcoholic cirrhosis of liver without ascites: Secondary | ICD-10-CM

## 2012-10-07 LAB — COMPREHENSIVE METABOLIC PANEL
CO2: 39 mEq/L — ABNORMAL HIGH (ref 19–32)
Calcium: 8.8 mg/dL (ref 8.4–10.5)
Creatinine, Ser: 0.51 mg/dL (ref 0.50–1.10)
GFR calc Af Amer: >90 mL/min (ref >90)
GFR calc non Af Amer: >90 mL/min (ref >90)
Glucose, Bld: 148 mg/dL — ABNORMAL HIGH (ref 70–99)

## 2012-10-07 LAB — POCT I-STAT 3, ART BLOOD GAS (G3+)
Bicarbonate: 41.5 mEq/L — ABNORMAL HIGH (ref 20.0–24.0)
pCO2 arterial: 74.1 mmHg (ref 35.0–45.0)
pH, Arterial: 7.357 (ref 7.350–7.450)
pO2, Arterial: 494 mmHg — ABNORMAL HIGH (ref 80.0–100.0)

## 2012-10-07 LAB — URINALYSIS, ROUTINE W REFLEX MICROSCOPIC
Glucose, UA: NEGATIVE mg/dL
Hgb urine dipstick: NEGATIVE
Protein, ur: 30 mg/dL — AB
Urobilinogen, UA: 1 mg/dL (ref 0.0–1.0)

## 2012-10-07 LAB — STREP PNEUMONIAE URINARY ANTIGEN: Strep Pneumo Urinary Antigen: NEGATIVE

## 2012-10-07 LAB — DIFFERENTIAL
Basophils Relative: 0 % (ref 0–1)
Eosinophils Absolute: 0.1 10*3/uL (ref 0.0–0.7)
Monocytes Relative: 5 % (ref 3–12)

## 2012-10-07 LAB — URINALYSIS, DIPSTICK ONLY
Leukocytes, UA: NEGATIVE
Nitrite: NEGATIVE
Specific Gravity, Urine: 1.014 (ref 1.005–1.030)
pH: 6 (ref 5.0–8.0)

## 2012-10-07 LAB — GLUCOSE, CAPILLARY

## 2012-10-07 LAB — MRSA PCR SCREENING: MRSA by PCR: NEGATIVE

## 2012-10-07 LAB — CBC
HCT: 30.2 % — ABNORMAL LOW (ref 36.0–46.0)
MCHC: 30.8 g/dL (ref 30.0–36.0)
MCV: 96.5 fL (ref 78.0–100.0)
RDW: 14.4 % (ref 11.5–15.5)

## 2012-10-07 LAB — LACTIC ACID, PLASMA: Lactic Acid, Venous: 1.1 mmol/L (ref 0.5–2.2)

## 2012-10-07 LAB — POCT I-STAT, CHEM 8
Chloride: 97 mEq/L (ref 96–112)
Glucose, Bld: 143 mg/dL — ABNORMAL HIGH (ref 70–99)
HCT: 30 % — ABNORMAL LOW (ref 36.0–46.0)
Potassium: 3.6 mEq/L (ref 3.5–5.1)

## 2012-10-07 LAB — URINE MICROSCOPIC-ADD ON

## 2012-10-07 LAB — TROPONIN I
Troponin I: <0.3 ng/mL (ref <0.30)
Troponin I: <0.3 ng/mL (ref <0.30)

## 2012-10-07 MED ORDER — PROPOFOL 10 MG/ML IV EMUL
INTRAVENOUS | Status: AC
  Administered 2012-10-07: 10 ug/kg/min
  Filled 2012-10-07: qty 100

## 2012-10-07 MED ORDER — SODIUM CHLORIDE 0.9 % IV SOLN
INTRAVENOUS | Status: DC
  Administered 2012-10-07: 12:00:00 via INTRAVENOUS

## 2012-10-07 MED ORDER — NOREPINEPHRINE BITARTRATE 1 MG/ML IJ SOLN
2.0000 ug/min | INTRAVENOUS | Status: DC
  Administered 2012-10-07: 25 ug/min via INTRAVENOUS
  Administered 2012-10-08: 12 ug/min via INTRAVENOUS
  Administered 2012-10-09: 9 ug/min via INTRAVENOUS
  Filled 2012-10-07 (×7): qty 8

## 2012-10-07 MED ORDER — NOREPINEPHRINE BITARTRATE 1 MG/ML IJ SOLN
2.0000 ug/min | INTRAMUSCULAR | Status: DC
  Administered 2012-10-07: 25 ug/min via INTRAVENOUS
  Filled 2012-10-07 (×3): qty 4

## 2012-10-07 MED ORDER — FENTANYL CITRATE 0.05 MG/ML IJ SOLN
INTRAMUSCULAR | Status: DC | PRN
  Administered 2012-10-07: 50 ug via INTRAVENOUS

## 2012-10-07 MED ORDER — FENTANYL BOLUS VIA INFUSION
50.0000 ug | Freq: Four times a day (QID) | INTRAVENOUS | Status: DC | PRN
  Filled 2012-10-07: qty 100

## 2012-10-07 MED ORDER — ASPIRIN 300 MG RE SUPP: 300.0000 mg | RECTAL | Status: AC

## 2012-10-07 MED ORDER — ALBUTEROL SULFATE (5 MG/ML) 0.5% IN NEBU: 2.5000 mg | INHALATION_SOLUTION | RESPIRATORY_TRACT | Status: DC | PRN

## 2012-10-07 MED ORDER — PIPERACILLIN-TAZOBACTAM 3.375 G IVPB
3.3750 g | Freq: Three times a day (TID) | INTRAVENOUS | Status: DC
Start: 2012-10-07 — End: 2012-10-10
  Administered 2012-10-07 – 2012-10-10 (×9): 3.375 g via INTRAVENOUS
  Filled 2012-10-07 (×11): qty 50

## 2012-10-07 MED ORDER — NOREPINEPHRINE BITARTRATE 1 MG/ML IJ SOLN
INTRAMUSCULAR | Status: AC | PRN
  Administered 2012-10-07: 30 ug via INTRAVENOUS

## 2012-10-07 MED ORDER — HYDROCORTISONE SOD SUCCINATE 100 MG IJ SOLR
100.0000 mg | Freq: Three times a day (TID) | INTRAMUSCULAR | Status: DC
  Administered 2012-10-07 – 2012-10-11 (×12): 100 mg via INTRAVENOUS
  Filled 2012-10-07 (×14): qty 2

## 2012-10-07 MED ORDER — VANCOMYCIN HCL 1000 MG IV SOLR
1250.0000 mg | INTRAVENOUS | Status: AC
  Administered 2012-10-07: 1250 mg via INTRAVENOUS
  Filled 2012-10-07: qty 1250

## 2012-10-07 MED ORDER — ASPIRIN 81 MG PO CHEW
324.0000 mg | CHEWABLE_TABLET | ORAL | Status: AC
  Administered 2012-10-07: 324 mg via ORAL
  Filled 2012-10-07 (×2): qty 1
  Filled 2012-10-07: qty 3

## 2012-10-07 MED ORDER — MIDAZOLAM BOLUS VIA INFUSION
1.0000 mg | INTRAVENOUS | Status: DC | PRN
  Filled 2012-10-07: qty 2

## 2012-10-07 MED ORDER — SUCCINYLCHOLINE CHLORIDE 20 MG/ML IJ SOLN
100.0000 mg | Freq: Once | INTRAMUSCULAR | Status: AC
  Administered 2012-10-07: 100 mg via INTRAVENOUS

## 2012-10-07 MED ORDER — INSULIN ASPART 100 UNIT/ML ~~LOC~~ SOLN
1.0000 [IU] | SUBCUTANEOUS | Status: DC
  Administered 2012-10-07: 1 [IU] via SUBCUTANEOUS
  Administered 2012-10-07 (×2): 3 [IU] via SUBCUTANEOUS
  Administered 2012-10-08: 1 [IU] via SUBCUTANEOUS
  Administered 2012-10-08 (×2): 3 [IU] via SUBCUTANEOUS
  Administered 2012-10-08: 2 [IU] via SUBCUTANEOUS

## 2012-10-07 MED ORDER — PIPERACILLIN-TAZOBACTAM 3.375 G IVPB 30 MIN: 3.3750 g | Freq: Once | INTRAVENOUS | Status: DC

## 2012-10-07 MED ORDER — DEXTROSE 5 % IV SOLN
INTRAVENOUS | Status: AC
  Filled 2012-10-07: qty 250

## 2012-10-07 MED ORDER — SODIUM CHLORIDE 0.9 % IV SOLN
2.0000 mg/h | INTRAVENOUS | Status: DC
  Administered 2012-10-07 (×2): 1 mg/h via INTRAVENOUS
  Administered 2012-10-08 – 2012-10-09 (×2): 2 mg/h via INTRAVENOUS
  Filled 2012-10-07 (×5): qty 10

## 2012-10-07 MED ORDER — PANTOPRAZOLE SODIUM 40 MG IV SOLR
40.0000 mg | Freq: Every day | INTRAVENOUS | Status: DC
  Administered 2012-10-07: 40 mg via INTRAVENOUS
  Filled 2012-10-07 (×2): qty 40

## 2012-10-07 MED ORDER — FENTANYL CITRATE 0.05 MG/ML IJ SOLN
INTRAMUSCULAR | Status: AC
  Filled 2012-10-07: qty 2

## 2012-10-07 MED ORDER — ETOMIDATE 2 MG/ML IV SOLN
20.0000 mg | Freq: Once | INTRAVENOUS | Status: AC
  Administered 2012-10-07: 20 mg via INTRAVENOUS

## 2012-10-07 MED ORDER — SODIUM CHLORIDE 0.9 % IV SOLN
2.0000 mg/h | INTRAVENOUS | Status: DC
  Administered 2012-10-07: 2 mg/h via INTRAVENOUS
  Administered 2012-10-07: 1 mg/h via INTRAVENOUS
  Filled 2012-10-07: qty 10

## 2012-10-07 MED ORDER — SODIUM CHLORIDE 0.9 % IV SOLN
1250.0000 mg | INTRAVENOUS | Status: DC
  Filled 2012-10-07: qty 1250

## 2012-10-07 MED ORDER — VANCOMYCIN HCL 500 MG IV SOLR
500.0000 mg | Freq: Two times a day (BID) | INTRAVENOUS | Status: DC
  Administered 2012-10-08 – 2012-10-10 (×6): 500 mg via INTRAVENOUS
  Filled 2012-10-07 (×7): qty 500

## 2012-10-07 MED ORDER — IPRATROPIUM-ALBUTEROL 18-103 MCG/ACT IN AERO
6.0000 | INHALATION_SPRAY | RESPIRATORY_TRACT | Status: DC
  Administered 2012-10-07 – 2012-10-11 (×24): 6 via RESPIRATORY_TRACT
  Filled 2012-10-07 (×2): qty 14.7

## 2012-10-07 MED ORDER — LEVOTHYROXINE SODIUM 100 MCG IV SOLR
87.5000 ug | Freq: Every day | INTRAVENOUS | Status: DC
  Administered 2012-10-07: 87.5 ug via INTRAVENOUS
  Filled 2012-10-07 (×3): qty 4.4

## 2012-10-07 MED ORDER — SODIUM CHLORIDE 0.9 % IV SOLN
50.0000 ug/h | INTRAVENOUS | Status: DC
  Administered 2012-10-07: 10 ug/h via INTRAVENOUS
  Administered 2012-10-08: 100 ug/h via INTRAVENOUS
  Administered 2012-10-09: 150 ug/h via INTRAVENOUS
  Filled 2012-10-07 (×3): qty 50

## 2012-10-07 MED ORDER — DEXTROSE 5 % IV SOLN
2.0000 ug/min | Freq: Once | INTRAVENOUS | Status: AC
  Administered 2012-10-07: 10 ug/min via INTRAVENOUS
  Filled 2012-10-07: qty 4

## 2012-10-07 MED ORDER — SODIUM CHLORIDE 0.9 % IV BOLUS (SEPSIS)
1000.0000 mL | Freq: Once | INTRAVENOUS | Status: AC
  Administered 2012-10-07: 1000 mL via INTRAVENOUS

## 2012-10-07 MED ORDER — ASPIRIN EC 81 MG PO TBEC
81.0000 mg | DELAYED_RELEASE_TABLET | Freq: Every day | ORAL | Status: DC
Start: 2012-10-08 — End: 2012-10-08
  Filled 2012-10-07 (×2): qty 1

## 2012-10-07 MED ORDER — FENTANYL CITRATE 0.05 MG/ML IJ SOLN: 20.0000 ug | INTRAMUSCULAR | Status: DC | PRN

## 2012-10-07 MED ORDER — FENTANYL CITRATE 0.05 MG/ML IJ SOLN
INTRAMUSCULAR | Status: AC
  Administered 2012-10-07: 100 ug
  Filled 2012-10-07: qty 2

## 2012-10-07 MED ORDER — SODIUM CHLORIDE 0.9 % IV SOLN: 250.0000 mL | INTRAVENOUS | Status: DC | PRN

## 2012-10-07 MED ORDER — SODIUM CHLORIDE 0.9 % IV SOLN
20.0000 ug/h | INTRAVENOUS | Status: DC
  Administered 2012-10-07: 20 ug/h via INTRAVENOUS
  Filled 2012-10-07: qty 50

## 2012-10-07 MED ORDER — NOREPINEPHRINE BITARTRATE 1 MG/ML IJ SOLN
INTRAMUSCULAR | Status: AC
  Administered 2012-10-07: 10 ug/kg/min
  Filled 2012-10-07: qty 4

## 2012-10-07 NOTE — Progress Notes (Signed)
eLink Physician-Brief Progress Note Patient Name: [name] DOB: May 23, 1941 MRN: [id number]  Date of Service  10/07/2012   HPI/Events of Note     eICU Interventions  Stress dose steroids On high dose levo gtt- post arrest shock ? cardiogenic   Intervention Category Major Interventions: Hypotension - evaluation and management  Shaday Rayborn V. 10/07/2012, 4:19 PM

## 2012-10-07 NOTE — Code Documentation (Signed)
Generalized edema.  

## 2012-10-07 NOTE — Code Documentation (Signed)
No family present during initial hospital stay. Pt does NOT have  limited code or DNR status. Treat as Full Code.

## 2012-10-07 NOTE — ED Provider Notes (Signed)
History     CSN: [id number]  Arrival date & time 10/07/12  7829   None     Chief Complaint  Patient presents with  . Cardiac Arrest    (Consider location/radiation/quality/duration/timing/severity/associated sxs/prior treatment) HPI Hx per EMS, called out for CPR. Reported less than 5 min CPR with return of pulses and fopund to have blood sugar 27, given glucose and PT min responsive in resp distress, no verbal MAE x 4, level 5 caveat applies. Arrives in ED resp distress frothy sputum. IO access only, reported full code.  Past Medical History  Diagnosis Date  . Hypertension   . COPD (chronic obstructive pulmonary disease)   . Diabetes mellitus   . Elevated cholesterol   . History of ETOH abuse   . Hypothyroid   . Cirrhosis, alcoholic   . Psychosis   . Lymphoma     T cell  . Stroke     Occipital 2004  . Asthma     No past surgical history on file.  No family history on file.  History  Substance Use Topics  . Smoking status: Current Every Day Smoker -- 0.5 packs/day for 56 years    Types: Cigarettes  . Smokeless tobacco: Never Used  . Alcohol Use: No     Sober since 1993    OB History    Grav Para Term Preterm Abortions TAB SAB Ect Mult Living                  Review of Systems  Unable to perform ROS level 5 caveat as above  Allergies  Review of patient's allergies indicates no known allergies.  Home Medications   Current Outpatient Rx  Name Route Sig Dispense Refill  . ASPIRIN 81 MG PO TABS Oral Take 81 mg by mouth daily.    . BUDESONIDE 0.25 MG/2ML IN SUSP Nebulization Take 0.25 mg by nebulization 2 (two) times daily.    Marland Kitchen VITAMIN D 1000 UNITS PO TABS Oral Take 5,000 Units by mouth daily.    . IPRATROPIUM-ALBUTEROL 0.5-2.5 (3) MG/3ML IN SOLN Nebulization Take 3 mLs by nebulization 2 (two) times daily.    Marland Kitchen LEVOTHYROXINE SODIUM 175 MCG PO TABS Oral Take 175 mcg by mouth daily.    Marland Kitchen METFORMIN HCL 1000 MG PO TABS Oral Take 1,000 mg by mouth 2 (two)  times daily with a meal.    . METFORMIN HCL 500 MG PO TABS Oral Take 500 mg by mouth daily.    . MULTIVITAMINS PO CAPS Oral Take 1 capsule by mouth daily.    Marland Kitchen PANCRELIPASE (LIP-PROT-AMYL) 20000 UNITS PO CPEP  2 capsules with meals and 1 capsule with snacks    . SIMVASTATIN 20 MG PO TABS Oral Take 20 mg by mouth every evening.      There were no vitals taken for this visit.  Physical Exam  Nursing note and vitals reviewed. Constitutional:       Min responsive in resp distress  HENT:  Head: Normocephalic and atraumatic.  Eyes: Conjunctivae normal are normal. No scleral icterus.  Neck: JVD present.  Cardiovascular: Normal heart sounds and intact distal pulses.   Pulmonary/Chest: No stridor.       frothy sputum. BVM with very tight airway unable to hear any breath sounds with chest rise  Abdominal: Soft. Bowel sounds are normal.  Musculoskeletal:       1 plus peripheral symeteric edem  Neurological:       Non verbal, eyes closed. withdrawls  Skin: Skin is warm and dry.    ED Course  INTUBATION Date/Time: 10/07/2012 6:42 AM Performed by: Sunnie Nielsen Authorized by: Sunnie Nielsen Consent: The procedure was performed in an emergent situation. Required items: required blood products, implants, devices, and special equipment available Patient identity confirmed: arm band Time out: Immediately prior to procedure a "time out" was called to verify the correct patient, procedure, equipment, support staff and site/side marked as required. Intubation method: direct Patient status: paralyzed (RSI) Preoxygenation: BVM Sedatives: etomidate Paralytic: succinylcholine Laryngoscope size: Mac 4 Tube size: 7.5 mm Number of attempts: 1 Ventilation between attempts: BVM Cricoid pressure: yes Cords visualized: yes Post-procedure assessment: chest rise and CO2 detector Breath sounds: equal  CENTRAL LINE Date/Time: 10/07/2012 6:43 AM Performed by: Sunnie Nielsen Authorized by: Sunnie Nielsen Consent: The procedure was performed in an emergent situation. Required items: required blood products, implants, devices, and special equipment available Patient identity confirmed: arm band Time out: Immediately prior to procedure a "time out" was called to verify the correct patient, procedure, equipment, support staff and site/side marked as required. Indications: vascular access Anesthesia: local infiltration Local anesthetic: lidocaine 1% without epinephrine Anesthetic total: 2 ml Preparation: skin prepped with 2% chlorhexidine Skin prep agent dried: skin prep agent completely dried prior to procedure Sterile barriers: all five maximum sterile barriers used - cap, mask, sterile gown, sterile gloves, and large sterile sheet Hand hygiene: hand hygiene performed prior to central venous catheter insertion Location details: right femoral Patient position: Trendelenburg Catheter type: triple lumen Ultrasound guidance: yes Number of attempts: 1 Successful placement: yes Post-procedure: line sutured and dressing applied Assessment: blood return through all parts and free fluid flow Patient tolerance: Patient tolerated the procedure well with no immediate complications.   Results for orders placed during the hospital encounter of 10/07/12  GLUCOSE, CAPILLARY      Component Value Range   Glucose-Capillary 158 (*) 70 - 99 mg/dL   Comment 1 Notify RN    CBC      Component Value Range   WBC 26.8 (*) 4.0 - 10.5 K/uL   RBC 3.13 (*) 3.87 - 5.11 MIL/uL   Hemoglobin 9.3 (*) 12.0 - 15.0 g/dL   HCT 16.1 (*) 09.6 - 04.5 %   MCV 96.5  78.0 - 100.0 fL   MCH 29.7  26.0 - 34.0 pg   MCHC 30.8  30.0 - 36.0 g/dL   RDW 40.9  81.1 - 91.4 %   Platelets 246  150 - 400 K/uL  PRO B NATRIURETIC PEPTIDE      Component Value Range   Pro B Natriuretic peptide (BNP) 465.5 (*) 0 - 125 pg/mL  TROPONIN I      Component Value Range   Troponin I <0.30  <0.30 ng/mL  COMPREHENSIVE METABOLIC PANEL       Component Value Range   Sodium 145  135 - 145 mEq/L   Potassium 4.0  3.5 - 5.1 mEq/L   Chloride 102  96 - 112 mEq/L   CO2 39 (*) 19 - 32 mEq/L   Glucose, Bld 148 (*) 70 - 99 mg/dL   BUN 18  6 - 23 mg/dL   Creatinine, Ser 7.82  0.50 - 1.10 mg/dL   Calcium 8.8  8.4 - 95.6 mg/dL   Total Protein 5.2 (*) 6.0 - 8.3 g/dL   Albumin 2.4 (*) 3.5 - 5.2 g/dL   AST 27  0 - 37 U/L   ALT 27  0 - 35 U/L  Alkaline Phosphatase 50  39 - 117 U/L   Total Bilirubin 0.5  0.3 - 1.2 mg/dL   GFR calc non Af Amer >90  >90 mL/min   GFR calc Af Amer >90  >90 mL/min  LACTIC ACID, PLASMA      Component Value Range   Lactic Acid, Venous 1.1  0.5 - 2.2 mmol/L  URINALYSIS, DIPSTICK ONLY      Component Value Range   Specific Gravity, Urine 1.014  1.005 - 1.030   pH 6.0  5.0 - 8.0   Glucose, UA 100 (*) NEGATIVE mg/dL   Hgb urine dipstick NEGATIVE  NEGATIVE   Bilirubin Urine NEGATIVE  NEGATIVE   Ketones, ur NEGATIVE  NEGATIVE mg/dL   Protein, ur NEGATIVE  NEGATIVE mg/dL   Urobilinogen, UA 0.2  0.0 - 1.0 mg/dL   Nitrite NEGATIVE  NEGATIVE   Leukocytes, UA NEGATIVE  NEGATIVE  TROPONIN I      Component Value Range   Troponin I <0.30  <0.30 ng/mL  TROPONIN I      Component Value Range   Troponin I <0.30  <0.30 ng/mL  PROCALCITONIN      Component Value Range   Procalcitonin <0.10    URINALYSIS, ROUTINE W REFLEX MICROSCOPIC      Component Value Range   Color, Urine AMBER (*) YELLOW   APPearance CLOUDY (*) CLEAR   Specific Gravity, Urine 1.028  1.005 - 1.030   pH 5.5  5.0 - 8.0   Glucose, UA NEGATIVE  NEGATIVE mg/dL   Hgb urine dipstick NEGATIVE  NEGATIVE   Bilirubin Urine SMALL (*) NEGATIVE   Ketones, ur 15 (*) NEGATIVE mg/dL   Protein, ur 30 (*) NEGATIVE mg/dL   Urobilinogen, UA 1.0  0.0 - 1.0 mg/dL   Nitrite NEGATIVE  NEGATIVE   Leukocytes, UA SMALL (*) NEGATIVE  STREP PNEUMONIAE URINARY ANTIGEN      Component Value Range   Strep Pneumo Urinary Antigen NEGATIVE  NEGATIVE  TSH      Component Value  Range   TSH 2.300  0.350 - 4.500 uIU/mL  DIFFERENTIAL      Component Value Range   Neutrophils Relative 78 (*) 43 - 77 %   Neutro Abs 20.8 (*) 1.7 - 7.7 K/uL   Lymphocytes Relative 17  12 - 46 %   Lymphs Abs 4.5 (*) 0.7 - 4.0 K/uL   Monocytes Relative 5  3 - 12 %   Monocytes Absolute 1.4 (*) 0.1 - 1.0 K/uL   Eosinophils Relative 1  0 - 5 %   Eosinophils Absolute 0.1  0.0 - 0.7 K/uL   Basophils Relative 0  0 - 1 %   Basophils Absolute 0.0  0.0 - 0.1 K/uL  GLUCOSE, CAPILLARY      Component Value Range   Glucose-Capillary 127 (*) 70 - 99 mg/dL  MRSA PCR SCREENING      Component Value Range   MRSA by PCR NEGATIVE  NEGATIVE  URINE MICROSCOPIC-ADD ON      Component Value Range   Squamous Epithelial / LPF FEW (*) RARE   WBC, UA 7-10  <3 WBC/hpf   RBC / HPF 3-6  <3 RBC/hpf   Bacteria, UA RARE  RARE   Urine-Other MUCOUS PRESENT    POCT I-STAT, CHEM 8      Component Value Range   Sodium 143  135 - 145 mEq/L   Potassium 3.6  3.5 - 5.1 mEq/L   Chloride 97  96 - 112 mEq/L  BUN 18  6 - 23 mg/dL   Creatinine, Ser 9.14  0.50 - 1.10 mg/dL   Glucose, Bld 782 (*) 70 - 99 mg/dL   Calcium, Ion 9.56  2.13 - 1.30 mmol/L   TCO2 37  0 - 100 mmol/L   Hemoglobin 10.2 (*) 12.0 - 15.0 g/dL   HCT 08.6 (*) 57.8 - 46.9 %  POCT I-STAT 3, BLOOD GAS (G3+)      Component Value Range   pH, Arterial 7.357  7.350 - 7.450   pCO2 arterial 74.1 (*) 35.0 - 45.0 mmHg   pO2, Arterial 494.0 (*) 80.0 - 100.0 mmHg   Bicarbonate 41.5 (*) 20.0 - 24.0 mEq/L   TCO2 44  0 - 100 mmol/L   O2 Saturation 100.0     Acid-Base Excess 13.0 (*) 0.0 - 2.0 mmol/L   Patient temperature 98.6 F     Collection site RADIAL, ALLEN'S TEST ACCEPTABLE     Sample type ARTERIAL     Comment NOTIFIED PHYSICIAN    GLUCOSE, CAPILLARY      Component Value Range   Glucose-Capillary 234 (*) 70 - 99 mg/dL  GLUCOSE, CAPILLARY      Component Value Range   Glucose-Capillary 263 (*) 70 - 99 mg/dL   Comment 1 Notify RN     Comment 2 Documented  in Chart    GLUCOSE, CAPILLARY      Component Value Range   Glucose-Capillary 181 (*) 70 - 99 mg/dL   Comment 1 Notify RN     Comment 2 Documented in Chart     Ct Head Wo Contrast  10/07/2012  *RADIOLOGY REPORT*  Clinical Data: Status post cardiac arrest.  Unresponsive.  Previous stroke.  Diabetes hypertension.  CT HEAD WITHOUT CONTRAST  Technique:  Contiguous axial images were obtained from the base of the skull through the vertex without contrast.  Comparison: 09/06/2012 and 12/12/2003  Findings: There is no evidence of intracranial hemorrhage, brain edema or other signs of acute infarction.  There is no evidence of intracranial mass lesion or mass effect.  No abnormal extra-axial fluid collections are identified.  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  Mild diffuse cerebral atrophy is also stable.  Ventricles are stable in size.  No skull abnormality identified.  IMPRESSION:  1.  No acute intracranial abnormality. 2.  Old left parietal infarct. 3.  Mild cerebral atrophy.   Original Report Authenticated By: Danae Orleans, M.D.    Dg Chest Portable 1 View  10/07/2012  *RADIOLOGY REPORT*  Clinical Data: Cardiac arrest  PORTABLE CHEST - 1 VIEW  Comparison: 09/06/2012  Findings: Endotracheal tube has been placed with its tip 3.0 cm from the carina.  Patchy and ill-defined opacities have developed at the right lung base.  Minimal patchy density at the retrocardiac left base.  Emphysema.  Blunting of postcontrast chronic angles is stable.  No pneumothorax.  IMPRESSION: Bibasilar patchy airspace disease.  Endotracheal tube placed.   Original Report Authenticated By: Donavan Burnet, M.D.    Dg Chest Port 1v Same Day  10/07/2012  *RADIOLOGY REPORT*  Clinical Data: Central venous line.  Evaluate endotracheal tube.  PORTABLE CHEST - 1 VIEW SAME DAY  Comparison: Chest x-ray 10/07/2012.  Findings: An endotracheal tube is in place with tip 3.6 cm above the carina. A nasogastric tube is seen  extending into the stomach, however, the tip of the nasogastric tube extends below the lower margin of the image.  Worsening airspace consolidation in the  right lower lobe partially obscuring the right hemidiaphragm now. Blunting of the right costophrenic sulcus indicative of enlarging small right pleural effusion.  Left lung is clear.  Pulmonary vasculature is normal.  Cardiomediastinal silhouette is within normal limits.  Atherosclerosis in the thoracic aorta.  IMPRESSION: 1.  Support apparatus, as above. 2.  Increasing airspace consolidation in the right lower lobe compatible with evolving pneumonia or sequela of aspiration. 3.  Enlarging small right pleural effusion. 4.  Atherosclerosis.   Original Report Authenticated By: Florencia Reasons, M.D.        Date: 10/07/2012  Rate: 81  Rhythm: normal sinus rhythm  QRS Axis: normal  Intervals: normal  ST/T Wave abnormalities: nonspecific ST changes  Conduction Disutrbances:none  Narrative Interpretation:   Old EKG Reviewed: unchanged    CRITICAL CARE Performed by: Vee Bahe   Total critical care time: 65  Critical care time was exclusive of separately billable procedures and treating other patients.  Critical care was necessary to treat or prevent imminent or life-threatening deterioration.  Critical care was time spent personally by me on the following activities: development of treatment plan with patient and/or surrogate as well as nursing, discussions with consultants, evaluation of patient's response to treatment, examination of patient, obtaining history from patient or surrogate, ordering and performing treatments and interventions, ordering and review of laboratory studies, ordering and review of radiographic studies, pulse oximetry and re-evaluation of patient's condition. No IV access and unable to get any with multiple attempts, required central line and emergent intubation. Not a cooling protocol candidate for ROSC post CPR  for hypoglycemia. repeat CBG in ED WNL. PCCM consult d/w Dr Delford Field. Vent management, sedation management. Critical care team evaluated for ICU admitICU admit.   MDM   Reported cardiac arrest at nursing home with CPR/ ROSC and found to be hypoglycemic. pulm edema and resp failure on arrival to ED requiring CVC for no IV access and intubation. ECG, CXR, labs as above. ICU admit        Sunnie Nielsen, MD 10/08/12 [phone]

## 2012-10-07 NOTE — ED Notes (Signed)
Pt's CBG was 158 when I checked it.6:52 am JG

## 2012-10-07 NOTE — H&P (Signed)
Name: [name] MRN: [id number] DOB: July 13, 1941    LOS: 0  Referring Provider:  EDP  Reason for Referral:  Status post arrest   PULMONARY / CRITICAL CARE MEDICINE  HPI:  71 y/o F, SNF resident, with PMH of HTN, COPD, DM, Hypothyroidism, CVA (04) who presented to [facility] ED via EMS after being found unresponsive with ? pulse requiring approximately 5 minutes of CPR by SNF staff, attempts at intubation per EMS unsuccessful, CBG noted to be 27.  Upon arrival was intubated in ER.  Recent admit to [facility] 9/29 - 10/11 for AECOPD, Bipolar disorder with psychosis.  PCCM called for ICU admit.     Past Medical History  Diagnosis Date  . Hypertension   . COPD (chronic obstructive pulmonary disease)   . Diabetes mellitus   . Elevated cholesterol   . History of ETOH abuse   . Hypothyroid   . Cirrhosis, alcoholic   . Psychosis   . Lymphoma     T cell  . Stroke     Occipital 2004  . Asthma    No past surgical history on file. Prior to Admission medications   Medication Sig Start Date End Date Taking? Authorizing Provider  aspirin EC 81 MG tablet Take 81 mg by mouth daily.   Yes Historical Provider, MD  budesonide (PULMICORT) 0.25 MG/2ML nebulizer solution Take 0.25 mg by nebulization 2 (two) times daily.   Yes Historical Provider, MD  cholecalciferol (VITAMIN D) 1000 UNITS tablet Take 5,000 Units by mouth daily.   Yes Historical Provider, MD  insulin detemir (LEVEMIR) 100 UNIT/ML injection Inject 20 Units into the skin at bedtime.   Yes Historical Provider, MD  levothyroxine (SYNTHROID, LEVOTHROID) 175 MCG tablet Take 175 mcg by mouth daily.   Yes Historical Provider, MD  magnesium oxide (MAG-OX) 400 (241.3 MG) MG tablet Take 200 mg by mouth 2 (two) times daily.   Yes Historical Provider, MD  Multiple Vitamin (MULTIVITAMIN WITH MINERALS) TABS Take 1 tablet by mouth daily.   Yes Historical Provider, MD  OLANZapine (ZYPREXA) 10 MG tablet Take 10 mg by mouth at  bedtime.   Yes Historical Provider, MD  Pancrelipase, Lip-Prot-Amyl, (CREON) 24000 UNITS CPEP Take 2 capsules by mouth 3 (three) times daily with meals.   Yes Historical Provider, MD  pravastatin (PRAVACHOL) 40 MG tablet Take 40 mg by mouth every evening.   Yes Historical Provider, MD  predniSONE (DELTASONE) 20 MG tablet Take 20 mg by mouth daily. Taper decrease to 10 mg daily 10/10/12 for 3 days   Yes Historical Provider, MD  tiotropium (SPIRIVA) 18 MCG inhalation capsule Place 18 mcg into inhaler and inhale daily.   Yes Historical Provider, MD  vitamin B-12 (CYANOCOBALAMIN) 1000 MCG tablet Take 1,000 mcg by mouth daily.   Yes Historical Provider, MD   Allergies No Known Allergies  Family History No family history on file. Social History  reports that she has been smoking Cigarettes.  She has a 28 pack-year smoking history. She has never used smokeless tobacco. She reports that she does not drink alcohol or use illicit drugs.  Review Of Systems:  Unable to complete as pt on vent.   Events Since Admission: 10/13 - Admit s/p CPR, CBG 27, intubated in ER  Current Status: intubated on vent, sedated  Vital Signs: Temp:  [90.3 F (32.4 C)-90.7 F (32.6 C)] 90.7 F (32.6 C) (10/13 0725) Pulse Rate:  [61-91] 85  (10/13 0725) Resp:  [11-23] 13  (10/13 0725)  BP: (45-125)/(18-85) 125/85 mmHg (10/13 0725) SpO2:  [82 %-100 %] 100 % (10/13 0725) FiO2 (%):  [100 %] 100 % (10/13 0640)  Physical Examination: General:  Elderly female in NAD on vent Neuro:  Awakens, follows commands with propofol off, MAE HEENT:  Mm pink/moist Cardiovascular:  s1s2 rrr, distant tones Lungs:  Minimal inspiratory movement, coarse, OETT Abdomen:  Round/soft, bsx4 active Musculoskeletal:  No acute deformities Skin:  Cool / dry, 1-2+generalized edema  Active Problems:  * No active hospital problems. *    ASSESSMENT AND PLAN  PULMONARY No results found for this basename:  PHART:5,PCO2:5,PCO2ART:5,PO2ART:5,HCO3:5,O2SAT:5 in the last 168 hours Ventilator Settings: Vent Mode:  [-] PRVC FiO2 (%):  [100 %] 100 % Set Rate:  [16 bmp] 16 bmp Vt Set:  [440 mL] 440 mL PEEP:  [5 cmH20] 5 cmH20 CXR:  10/13>>> ETT:  10/13>>>Bibasilar patchy airspace disease.  Endotracheal tube in good position  A:   Acute Respiratory Failure Hx of COPD - recent exacerbation with admit Hypercarbia   P:   -full vent support 8cc / kg -f/u cxr, abg -sedation protocol -hold steroids in setting of known PSY hx -BD's  CARDIOVASCULAR No results found for this basename: TROPONINI:5,LATICACIDVEN:5, O2SATVEN:5,PROBNP:5 in the last 168 hours ECG:  Wnl, no st changes Lines: 10/13 R Fem TLC>>>  A:  S/p cardiac arrest  Hypotension Hx HTN Hypothermia  P:  -cycle cardiac enzymes -2D Echo -follow EKG -levophed for MAP >65 -asa -warming blanket -volume resuscitation   RENAL No results found for this basename: NA:5,K:2,CL:5,CO2:5,BUN:5,CREATININE:5,CALCIUM:5,MG:5,PHOS:5 in the last 168 hours Intake/Output    None    Foley:  10/13>>>  A:   Baseline NML renal function / high risk ARF with hypotension  P:   -follow BMP /lytes  GASTROINTESTINAL No results found for this basename: AST:5,ALT:5,ALKPHOS:5,BILITOT:5,PROT:5,ALBUMIN:5 in the last 168 hours  A:   NPO  P:   -insert OGT -if remains intubated, consider TF in am 10/14  HEMATOLOGIC No results found for this basename: HGB:5,HCT:5,PLT:5,INR:5,APTT:5 in the last 168 hours A:   No evidence bleeding  P:  -labs pending review  INFECTIOUS No results found for this basename: WBC:5,PROCALCITON:5 in the last 168 hours Cultures: 10/13 BCx2>>> 10/13 UA>>> 10/13 UC>>> 10/13 Sputum>>>  Antibiotics: Vanco (empiric, SNF)>>> Zosyn (empiric, SNF)>>>  A:   Rule out sepsis -most likely this is related to profound hypoglycemia . Minimal atx on cxr.  Urine studies pending  P:   -empiric abx -pan culture -check  lactic acid  ENDOCRINE  Lab 10/07/12 0651  GLUCAP 158*   A:  Diabetes - on levemir 20 QD with SSI Hypothyroidism - on 175 mcg QD maintenence P:   -SSI -check TSH -IV synthroid  NEUROLOGIC  A:   Bipolar Disorder - on zyprexa.   Hx of CVA - unclear residual effect Hx of ETOH abuse  P:   -hold zyprexa   BEST PRACTICE / DISPOSITION Level of Care:  ICU  Primary Service:  PCCM Consultants:   Code Status:  Full Code.  POA indicates she would be ok for short term support but not long term artifical support.  Diet:  NPO DVT Px:  SCD's -->heparin if CT head OK GI Px:  Protonix Skin Integrity:  intact Social / Family:  None Available at this time.   CC time 60 minutes  Levy Pupa, MD, PhD 10/07/2012, 8:59 AM [facility] [phone] or if no answer [phone]

## 2012-10-07 NOTE — Progress Notes (Signed)
  Echocardiogram 2D Echocardiogram has been performed.  [name] 10/07/2012, 1:08 PM

## 2012-10-07 NOTE — ED Notes (Signed)
[name] contact number [phone]

## 2012-10-07 NOTE — Progress Notes (Signed)
ANTIBIOTIC CONSULT NOTE - INITIAL  Pharmacy Consult for Vancomycin/Zosyn Indication: Suspected HCAP  No Known Allergies  Patient Measurements:   Wt at [facility] ED visit in 09/01/12- 58 Kg  Vital Signs: Temp: 90.7 F (32.6 C) (10/13 0812) BP: 99/73 mmHg (10/13 0812) Pulse Rate: 86  (10/13 0730) Intake/Output from previous day:   Intake/Output from this shift:    Labs: No results found for this basename: WBC:3,HGB:3,PLT:3,LABCREA:3,CREATININE:3 in the last 72 hours The CrCl is unknown because both a height and weight (above a minimum accepted value) are required for this calculation. No results found for this basename: VANCOTROUGH:2,VANCOPEAK:2,VANCORANDOM:2,GENTTROUGH:2,GENTPEAK:2,GENTRANDOM:2,TOBRATROUGH:2,TOBRAPEAK:2,TOBRARND:2,AMIKACINPEAK:2,AMIKACINTROU:2,AMIKACIN:2, in the last 72 hours   Microbiology: No results found for this or any previous visit (from the past 720 hour(s)).  Medical History: Past Medical History  Diagnosis Date  . Hypertension   . COPD (chronic obstructive pulmonary disease)   . Diabetes mellitus   . Elevated cholesterol   . History of ETOH abuse   . Hypothyroid   . Cirrhosis, alcoholic   . Psychosis   . Lymphoma     T cell  . Stroke     Occipital 2004  . Asthma     Medications:   (Not in a hospital admission) Scheduled:    . aspirin  324 mg Oral NOW   Or  . aspirin  300 mg Rectal NOW  . aspirin EC  81 mg Oral Daily  . dextrose      . etomidate  20 mg Intravenous Once  . fentaNYL      . fentaNYL      . insulin aspart  1-3 Units Subcutaneous Q4H  . levothyroxine  87.5 mcg Intravenous Daily  . norepinephrine      . norepinephrine (LEVOPHED) Adult infusion  2-50 mcg/min Intravenous Once  . pantoprazole (PROTONIX) IV  40 mg Intravenous QHS  . piperacillin-tazobactam  3.375 g Intravenous Once  . propofol      . sodium chloride  1,000 mL Intravenous Once  . succinylcholine  100 mg Intravenous Once  . vancomycin  1,250 mg Intravenous To  ER   Assessment: [name] is a NH resident with a hx of recent admission to an OSH for AECOPD. Noted she was found unresponsive at SNF today and s/p 5 minutes of CPR per SNF staff. She is now intubated and on levophed. Noted weight ~ 58 Kg at prior ED visit, and renal function was WNL at the time. All labs including blood cultures are pending.  Goal of Therapy:  Vancomycin trough level 15-20 mcg/ml  Plan:  Expected duration 10 days with resolution of temperature and/or normalization of WBC Measure antibiotic drug levels at steady state Follow up culture results - Zosyn 3.375gm IV now over 30 minutes, to be given after blood cultures are drawn, and give before Vanc if choose not to give both together (compatible). - Vancomycin 1250mg  IV now - Will f/up Scr to direct further dosing, if Scr nml, will plan Zosyn 3.375gm IV Q8h, each dose infused over 4 hours and Vanc 500mg  IV q 12h - Will monitor cx/spec/sens, renal fn and clinical status daily.  Thanks, Jola Critzer K. Allena Katz, PharmD, BCPS.  Clinical Pharmacist Pager [phone]. 10/07/2012 9:00 AM

## 2012-10-07 NOTE — ED Notes (Signed)
PTAR arrived onscene appros 0615 and intiated CPR for no pulse. Positive pulse after 5 min of CPR. At which point [facility] arrived and attempted ETT but was unable to because of patient biting down. Transport intiated and multiple IV sites attempted. Arrival in ED with BVM in progress and no IV.

## 2012-10-08 ENCOUNTER — Inpatient Hospital Stay (HOSPITAL_COMMUNITY): Payer: Medicare Other

## 2012-10-08 DIAGNOSIS — I469 Cardiac arrest, cause unspecified: Secondary | ICD-10-CM

## 2012-10-08 DIAGNOSIS — R6521 Severe sepsis with septic shock: Secondary | ICD-10-CM

## 2012-10-08 DIAGNOSIS — A419 Sepsis, unspecified organism: Secondary | ICD-10-CM | POA: Diagnosis present

## 2012-10-08 LAB — GLUCOSE, CAPILLARY
Glucose-Capillary: 140 mg/dL — ABNORMAL HIGH (ref 70–99)
Glucose-Capillary: 217 mg/dL — ABNORMAL HIGH (ref 70–99)
Glucose-Capillary: 258 mg/dL — ABNORMAL HIGH (ref 70–99)

## 2012-10-08 LAB — BLOOD GAS, ARTERIAL
MECHVT: 600 mL
O2 Saturation: 98 %
PEEP: 5 cmH2O
Patient temperature: 98.6
TCO2: 32.9 mmol/L (ref 0–100)
pH, Arterial: 7.419 (ref 7.350–7.450)

## 2012-10-08 LAB — BASIC METABOLIC PANEL
BUN: 17 mg/dL (ref 6–23)
Chloride: 98 mEq/L (ref 96–112)
Glucose, Bld: 167 mg/dL — ABNORMAL HIGH (ref 70–99)
Potassium: 3.9 mEq/L (ref 3.5–5.1)

## 2012-10-08 LAB — URINE CULTURE: Culture: NO GROWTH

## 2012-10-08 LAB — CBC
HCT: 26.5 % — ABNORMAL LOW (ref 36.0–46.0)
Hemoglobin: 8.7 g/dL — ABNORMAL LOW (ref 12.0–15.0)
RBC: 2.89 MIL/uL — ABNORMAL LOW (ref 3.87–5.11)
WBC: 23.4 10*3/uL — ABNORMAL HIGH (ref 4.0–10.5)

## 2012-10-08 MED ORDER — MAGNESIUM SULFATE 40 MG/ML IJ SOLN
2.0000 g | Freq: Once | INTRAMUSCULAR | Status: AC
  Administered 2012-10-08: 2 g via INTRAVENOUS
  Filled 2012-10-08: qty 50

## 2012-10-08 MED ORDER — ASPIRIN 81 MG PO CHEW
81.0000 mg | CHEWABLE_TABLET | Freq: Every day | ORAL | Status: DC
  Administered 2012-10-08 – 2012-10-15 (×8): 81 mg via ORAL
  Filled 2012-10-08 (×8): qty 1

## 2012-10-08 MED ORDER — JEVITY 1.2 CAL PO LIQD
1000.0000 mL | ORAL | Status: DC
  Administered 2012-10-09 – 2012-10-10 (×2): 1000 mL
  Filled 2012-10-08 (×4): qty 1000

## 2012-10-08 MED ORDER — PANCRELIPASE (LIP-PROT-AMYL) 12000-38000 UNITS PO CPEP
1.0000 | ORAL_CAPSULE | Freq: Three times a day (TID) | ORAL | Status: DC
  Administered 2012-10-08 – 2012-10-15 (×20): 1 via ORAL
  Filled 2012-10-08 (×24): qty 1

## 2012-10-08 MED ORDER — SODIUM CHLORIDE 0.9 % IV SOLN
INTRAVENOUS | Status: DC
  Administered 2012-10-08: 1.8 [IU]/h via INTRAVENOUS
  Filled 2012-10-08: qty 1

## 2012-10-08 MED ORDER — JEVITY 1.2 CAL PO LIQD
1000.0000 mL | ORAL | Status: DC
  Administered 2012-10-08: 20 mL
  Filled 2012-10-08 (×2): qty 1000

## 2012-10-08 MED ORDER — LEVOTHYROXINE SODIUM 175 MCG PO TABS
175.0000 ug | ORAL_TABLET | Freq: Every day | ORAL | Status: DC
  Administered 2012-10-08 – 2012-10-15 (×8): 175 ug via ORAL
  Filled 2012-10-08 (×8): qty 1

## 2012-10-08 MED ORDER — PANTOPRAZOLE SODIUM 40 MG PO PACK
40.0000 mg | PACK | Freq: Every day | ORAL | Status: DC
  Administered 2012-10-08 – 2012-10-10 (×3): 40 mg
  Filled 2012-10-08 (×4): qty 20

## 2012-10-08 MED ORDER — BIOTENE DRY MOUTH MT LIQD
15.0000 mL | Freq: Four times a day (QID) | OROMUCOSAL | Status: DC
  Administered 2012-10-08 – 2012-10-14 (×20): 15 mL via OROMUCOSAL

## 2012-10-08 MED ORDER — PRO-STAT SUGAR FREE PO LIQD
30.0000 mL | Freq: Four times a day (QID) | ORAL | Status: DC
  Administered 2012-10-08 – 2012-10-11 (×12): 30 mL
  Filled 2012-10-08 (×15): qty 30

## 2012-10-08 MED ORDER — ENOXAPARIN SODIUM 30 MG/0.3ML ~~LOC~~ SOLN
30.0000 mg | Freq: Every day | SUBCUTANEOUS | Status: DC
  Administered 2012-10-08 – 2012-10-09 (×2): 30 mg via SUBCUTANEOUS
  Filled 2012-10-08 (×3): qty 0.3

## 2012-10-08 MED ORDER — OLANZAPINE 10 MG PO TABS
10.0000 mg | ORAL_TABLET | Freq: Every day | ORAL | Status: DC
  Administered 2012-10-08 – 2012-10-14 (×6): 10 mg via ORAL
  Filled 2012-10-08 (×8): qty 1

## 2012-10-08 MED ORDER — CHLORHEXIDINE GLUCONATE 0.12 % MT SOLN
15.0000 mL | Freq: Two times a day (BID) | OROMUCOSAL | Status: DC
Start: 2012-10-08 — End: 2012-10-15
  Administered 2012-10-08 – 2012-10-14 (×12): 15 mL via OROMUCOSAL
  Filled 2012-10-08 (×16): qty 15

## 2012-10-08 NOTE — Procedures (Signed)
Central Venous Catheter Insertion Procedure Note [name] [id number] 1941-04-16  Procedure: Insertion of Central Venous Catheter Indications: Assessment of intravascular volume, Drug and/or fluid administration and Frequent blood sampling  Procedure Details Consent: Risks of procedure as well as the alternatives and risks of each were explained to the (patient/caregiver).  Consent for procedure obtained. Time Out: Verified patient identification, verified procedure, site/side was marked, verified correct patient position, special equipment/implants available, medications/allergies/relevent history reviewed, required imaging and test results available.  Performed  Maximum sterile technique was used including antiseptics, cap, gloves, gown, hand hygiene, mask and sheet. Skin prep: Chlorhexidine; local anesthetic administered A un-coated triple lumen catheter was placed at 18 cm in the left internal jugular vein using the Seldinger technique.  Evaluation Blood flow good Complications: No apparent complications Patient did tolerate procedure well. Chest X-ray ordered to verify placement.  CXR: pending.   Procedure performed under direct supervision of Dr. Vassie Loll and with ultrasound guidance. RIJ noted to be patent, [place] used for real time vein puncture & wire noted in vein prior to dilation.    Canary Brim, NP-C [facility] Pgr: [phone] or [phone]  [facility]

## 2012-10-08 NOTE — Care Management Note (Signed)
    Page 1 of 1   10/08/2012     12:39:55 PM   CARE MANAGEMENT NOTE 10/08/2012  Patient:  [name], [name]   Account Number:  [id number]  Date Initiated:  10/08/2012  Documentation initiated by:  Junius Creamer  Subjective/Objective Assessment:   adm w cardiac arrest     Action/Plan:   from [facility], sw ref made   Anticipated DC Date:     Anticipated DC Plan:  SKILLED NURSING FACILITY  In-house referral  Clinical Social Worker      DC Planning Services  CM consult      Choice offered to / List presented to:             Status of service:   Medicare Important Message given?   (If response is "NO", the following Medicare IM given date fields will be blank) Date Medicare IM given:   Date Additional Medicare IM given:    Discharge Disposition:    Per UR Regulation:  Reviewed for med. necessity/level of care/duration of stay  If discussed at Long Length of Stay Meetings, dates discussed:    Comments:  10/14 12:40p debbie Rossi Burdo rn,bsn [phone]

## 2012-10-08 NOTE — Clinical Social Work Psychosocial (Signed)
     Clinical Social Work Department BRIEF PSYCHOSOCIAL ASSESSMENT 10/08/2012  Patient:  [name], [name]     Account Number:  [id number]     Admit date:  10/07/2012  Clinical Social Worker:  Hulan Fray  Date/Time:  10/08/2012 03:17 PM  Referred by:  Care Management  Date Referred:  10/08/2012 Referred for  Other - See comment   Other Referral:   admit from facility   Interview type:  Other - See comment Other interview type:   [name]- Friend    PSYCHOSOCIAL DATA Living Status:  FACILITY Admitted from facility:  [facility] Level of care:  Skilled Nursing Facility Primary support name:  [name] Primary support relationship to patient:  FRIEND Degree of support available:   supportive    CURRENT CONCERNS Current Concerns  None Noted   Other Concerns:    SOCIAL WORK ASSESSMENT / PLAN Clinical Social Worker received referral for patient being admitted from a facility. CSW introduced self and explained reason for visit to patient's friend, [name]. CSW talked at length with [name] and he reported that he is the "only one that cares for her." [name] reported that the he has known the patient for 8 years and they lived together for about 7 years. [name] reported that the patient is estranged from her children and that they "don't want to have anything to do with her." [name] reported that that patient has been a chronic alcoholic and that may have been the cause for the estrangement from the children. [name] reported that he has been the only one, to check on her, help with medications, doctor appointments, bills, ect.    CSW spoke with [name] at [facility] and she reported that the patient was at her facility for a day before being admitted to the hospital. Per [name], the patient was previously at [facility] in the geriatric psych unit for a "couple of weeks" and was then cleared and sent to [facility]. Per [name], she would take patient  back if she is weaned off vent. [name] also reported that they can do trach care. CSW will complete FL2 for MD's signature and continue to follow for discharge planning needs.   Assessment/plan status:  Psychosocial Support/Ongoing Assessment of Needs Other assessment/ plan:   Information/referral to community resources:   Patient is from a facility    PATIENTS/FAMILYS RESPONSE TO PLAN OF CARE: Per patient's friend, [name], he would like patient to return to the facility if medically stable.

## 2012-10-08 NOTE — Progress Notes (Signed)
INITIAL ADULT NUTRITION ASSESSMENT Date: 10/08/2012   Time: 11:38 AM Reason for Assessment: Consult, TF initiation and management   INTERVENTION: 1. Change goal rate of Jevity 1.2 to 35 ml/hr.  2. Add 30 ml pro-stat via tube 4 times daily  This EN regimen will provide 1408 kcal, 107 gm protein, and 678 ml free water.  3. If IV fluids are decreased or d/c'd, pt will require additional free water to meet hydration needs 4. RD will continue to follow     DOCUMENTATION CODES Per approved criteria  -Not Applicable    ASSESSMENT: Female 71 y.o.  Dx: VRDF, cardiac arrest   Hx:  Past Medical History  Diagnosis Date  . Hypertension   . COPD (chronic obstructive pulmonary disease)   . Diabetes mellitus   . Elevated cholesterol   . History of ETOH abuse   . Hypothyroid   . Cirrhosis, alcoholic   . Psychosis   . Lymphoma     T cell  . Stroke     Occipital 2004  . Asthma    History reviewed. No pertinent past surgical history.   Related Meds:     . albuterol-ipratropium  6 puff Inhalation Q4H  . antiseptic oral rinse  15 mL Mouth Rinse QID  . aspirin  324 mg Oral NOW   Or  . aspirin  300 mg Rectal NOW  . aspirin  81 mg Oral Daily  . chlorhexidine  15 mL Mouth Rinse BID  . dextrose      . enoxaparin (LOVENOX) injection  30 mg Subcutaneous Daily  . feeding supplement (JEVITY 1.2 CAL)  1,000 mL Per Tube Q24H  . fentaNYL      . hydrocortisone sod succinate (SOLU-CORTEF) injection  100 mg Intravenous Q8H  . insulin aspart  1-3 Units Subcutaneous Q4H  . levothyroxine  175 mcg Oral Daily  . lipase/protease/amylase  1 capsule Oral TID AC  . magnesium sulfate 1 - 4 g bolus IVPB  2 g Intravenous Once  . magnesium sulfate 1 - 4 g bolus IVPB  2 g Intravenous Once  . OLANZapine  10 mg Oral QHS  . pantoprazole sodium  40 mg Per Tube Q1200  . piperacillin-tazobactam (ZOSYN)  IV  3.375 g Intravenous Q8H  . vancomycin  1,250 mg Intravenous NOW  . vancomycin  500 mg Intravenous  Q12H  . DISCONTD: aspirin EC  81 mg Oral Daily  . DISCONTD: levothyroxine  87.5 mcg Intravenous Daily  . DISCONTD: pantoprazole (PROTONIX) IV  40 mg Intravenous QHS     Ht: 5' 4.96" (165 cm)  Wt: 148 lb 9.4 oz (67.4 kg)  Ideal Wt: 56.8 kg  % Ideal Wt: 118%  Usual Wt:  Wt Readings from Last 5 Encounters:  10/08/12 148 lb 9.4 oz (67.4 kg)  09/01/12 128 lb 14.4 oz (58.469 kg)  04/23/12 135 lb (61.236 kg)  07/25/08 161 lb 4 oz (73.143 kg)  08/07/07 162 lb (73.483 kg)    % Usual Wt: >100%  Body mass index is 24.76 kg/(m^2). Pt is WNL per current BMI   Food/Nutrition Related Hx: Pt with poor appetite PTA per family report on MST (Malnutrition Screening Tool)   Labs:  CMP     Component Value Date/Time   NA 138 10/08/2012 0500   K 3.9 10/08/2012 0500   CL 98 10/08/2012 0500   CO2 33* 10/08/2012 0500   GLUCOSE 167* 10/08/2012 0500   BUN 17 10/08/2012 0500   CREATININE 0.54 10/08/2012 0500  CALCIUM 7.5* 10/08/2012 0500   PROT 5.2* 10/07/2012 0721   ALBUMIN 2.4* 10/07/2012 0721   AST 27 10/07/2012 0721   ALT 27 10/07/2012 0721   ALKPHOS 50 10/07/2012 0721   BILITOT 0.5 10/07/2012 0721   GFRNONAA >90 10/08/2012 0500   GFRAA >90 10/08/2012 0500      Intake/Output Summary (Last 24 hours) at 10/08/12 1142 Last data filed at 10/08/12 1127  Gross per 24 hour  Intake 3742.28 ml  Output   1735 ml  Net 2007.28 ml     Diet Order:    Supplements/Tube Feeding: Jevity 1.2 @ 20 ml/hr, providing 576 kcal, 27 gm protein, and 387 ml free water.   IVF:    sodium chloride Last Rate: 75 mL/hr at 10/07/12 1142  fentaNYL infusion INTRAVENOUS Last Rate: 100 mcg/hr (10/08/12 0529)  midazolam (VERSED) infusion Last Rate: 2 mg/hr (10/08/12 0524)  norepinephrine (LEVOPHED) Adult infusion Last Rate: 16 mcg/min (10/08/12 1100)  DISCONTD: norepinephrine (LEVOPHED) Adult infusion Last Rate: 25 mcg/min (10/07/12 1249)    Estimated Nutritional Needs:   Kcal: 1337  Protein: 100-125 gm    Fluid: 1.3 L   Pt with hx of recent COPD exacerbation. Resident of SNF, found unresponsive. Pt hypothermic on admission, now with warming blankets. Remains on pressors, some edema noted.   RD was consulted for initiation and management of EN. Jevity 1.2 started by MD, will continue this formula.   Patient is currently intubated on ventilator support.  MV: 8.3 Temp:Temp (24hrs), Avg:99.3 F (37.4 C), Min:95.9 F (35.5 C), Max:100.8 F (38.2 C) Pt has maintained 36-37 C for several hrs, will use 36.8 C for REE  Propofol: none    NUTRITION DIAGNOSIS: -Inadequate oral intake (NI-2.1).  Status: Ongoing  RELATED TO: inability to eat   AS EVIDENCE BY: mechanical ventilation   MONITORING/EVALUATION(Goals): Goal: meet >90% estimated nutrition needs while intubated  Monitor: vent status, weight, labs, I/O's  EDUCATION NEEDS: -No education needs identified at this time    Clarene Duke RD, LDN Pager [phone] After Hours pager [phone]  10/08/2012, 11:38 AM

## 2012-10-08 NOTE — Progress Notes (Signed)
Name: [name] MRN: [id number] DOB: 1941/12/25    LOS: 1  Referring Provider:  EDP  Reason for Referral:  Status post arrest PCP - Maryelizabeth Rowan   PULMONARY / CRITICAL CARE MEDICINE  HPI:  71 y/o F, SNF resident, with PMH of HTN, COPD, DM, Hypothyroidism, cirrhosis, CVA (04) who presented to [facility] ED via EMS after being found unresponsive with ? pulse requiring approximately 5 minutes of CPR by SNF staff, attempts at intubation per EMS unsuccessful, CBG noted to be 27.  Upon arrival was intubated in ER.  Recent admit to [facility] 2 9/29 - 10/11 for AECOPD, Bipolar disorder with psychosis.  PCCM called for ICU admit.    Events Since Admission: 10/13 - Admit s/p CPR, CBG 27, intubated in ER 10/13 echo - nml EF, thick atrial septum  Current Status: intubated on vent, sedated Remains critically ill on pressors   Vital Signs: Temp:  [93 F (33.9 C)-100.8 F (38.2 C)] 99 F (37.2 C) (10/14 0845) Pulse Rate:  [71-113] 73  (10/14 0849) Resp:  [12-20] 14  (10/14 0849) BP: (73-151)/(43-75) 127/64 mmHg (10/14 0849) SpO2:  [91 %-100 %] 100 % (10/14 0849) FiO2 (%):  [40 %] 40 % (10/14 0849) Weight:  [66.2 kg (145 lb 15.1 oz)-67.4 kg (148 lb 9.4 oz)] 67.4 kg (148 lb 9.4 oz) (10/14 0453)  Physical Examination: General:  Elderly female in NAD on vent Neuro:  Awakens, follows commands , MAE HEENT:  Mm pink/moist Cardiovascular:  s1s2 rrr, distant tones Lungs:  Minimal inspiratory movement, coarse, OETT Abdomen:  Round/soft, bsx4 active Musculoskeletal:  No acute deformities Skin:  Cool / dry, 1-2+generalized edema  Active Problems:  * No active hospital problems. *    ASSESSMENT AND PLAN  PULMONARY  Lab 10/08/12 0500 10/07/12 0827  PHART 7.419 7.357  PCO2ART 49.5* 74.1*  PO2ART 115.0* 494.0*  HCO3 31.4* 41.5*  O2SAT 98.0 100.0   Ventilator Settings: Vent Mode:  [-] PRVC FiO2 (%):  [40 %] 40 % Set Rate:  [14 bmp-16 bmp] 14 bmp Vt Set:  [600 mL]  600 mL PEEP:  [5 cmH20] 5 cmH20 Plateau Pressure:  [12 cmH20-14 cmH20] 13 cmH20 CXR:  10/13>>>RLL patchy airspace disease.  Endotracheal tube in good position ETT:  10/13>>>  A:   Acute Respiratory Failure Hx of COPD - recent exacerbation with admit Hypercarbia   P:   -full vent support 8cc / kg while on pressors -sedation protocol -No obvious bspasm -stress dose steroids in setting of known PSY hx -BD's  CARDIOVASCULAR  Lab 10/07/12 1945 10/07/12 1250 10/07/12 0920 10/07/12 0721  TROPONINI <0.30 <0.30 -- <0.30  LATICACIDVEN -- -- 1.1 --  PROBNP -- -- -- 465.5*   ECG:  Wnl, no st changes Lines: 10/13 R Fem TLC>>>  A:  S/p cardiac arrest  Hypotension Hx HTN Hypothermia -resolved EKG -nSR  P:  -levophed for MAP >65 -asa -Obtain CVP to decide if she needs further volume resuscitation -keep NS @ 75/h for now   RENAL  Lab 10/08/12 0500 10/07/12 0750 10/07/12 0721  NA 138 143 145  K 3.9 3.6 --  CL 98 97 102  CO2 33* -- 39*  BUN 17 18 18   CREATININE 0.54 0.90 0.51  CALCIUM 7.5* -- 8.8  MG 1.1* -- --  PHOS -- -- --   Intake/Output      10/13 0701 - 10/14 0700 10/14 0701 - 10/15 0700   I.V. (mL/kg) 2374.9 (35.2) 124.5 (1.8)  NG/GT 120    IV Piggyback 500 50   Total Intake(mL/kg) 2994.9 (44.4) 174.5 (2.6)   Urine (mL/kg/hr) 1460 (0.9) 50   Total Output 1460 50   Net +1534.9 +124.5         Foley:  10/13>>>  A:  Hypomagnesemia Baseline NML renal function / high risk ARF with hypotension  P:   -replete & rechk Mg  GASTROINTESTINAL  Lab 10/07/12 0721  AST 27  ALT 27  ALKPHOS 50  BILITOT 0.5  PROT 5.2*  ALBUMIN 2.4*    A:   NPO  P:   -start TF  -PPI  HEMATOLOGIC  Lab 10/08/12 0500 10/07/12 0750 10/07/12 0721  HGB 8.7* 10.2* 9.3*  HCT 26.5* 30.0* 30.2*  PLT 237 -- 246  INR -- -- --  APTT -- -- --   A:   No evidence bleeding  P:  Watch Hb  INFECTIOUS  Lab 10/08/12 0500 10/07/12 0845 10/07/12 0721  WBC 23.4* -- 26.8*    PROCALCITON -- <0.10 --   Cultures: 10/13 BCx2>>>neg 10/13 UA>>>7-10 WCs 10/13 UC>>> 10/13 Sputum>>> Urine strep ag neg  Antibiotics: Vanco (empiric, SNF)>>> Zosyn (empiric, SNF)>>>  A:   Rule out sepsis -low pct reassuring, most likely this is related to profound hypoglycemia . Minimal atx on cxr.    P:   -ct empiric abx   ENDOCRINE  Lab 10/08/12 0740 10/08/12 0402 10/08/12 0010 10/07/12 2011 10/07/12 1807  GLUCAP 237* 140* 181* 263* 234*   A:  Diabetes - on levemir 20 QD with SSI Hypothyroidism - on 175 mcg QD maintenance, TSH ok P:   -SSI -resume PO synthroid  NEUROLOGIC  A:   Bipolar Disorder - on zyprexa, valproate worked better per POA Hx of CVA - unclear residual effect Hx of ETOH abuse  P:   -resume zyprexa   BEST PRACTICE / DISPOSITION Level of Care:  ICU  Primary Service:  PCCM Consultants:   Code Status:  Full Code.  POA indicates she would be ok for short term support but not long term artifical support.  Onalee Hua will bring in POA papers, obtained consent for CVL Diet:  TFs  DVT Px:  SCD's -->heparin if CT head OK GI Px:  Protonix Skin Integrity:  intact Social / Family:  None Available at this time.   CC time 45 minutes    Cyril Mourning MD. FCCP. [facility] Pager [phone] If no response call [phone]   10/08/2012, 10:02 AM

## 2012-10-09 ENCOUNTER — Inpatient Hospital Stay (HOSPITAL_COMMUNITY): Payer: Medicare Other

## 2012-10-09 LAB — GLUCOSE, CAPILLARY
Glucose-Capillary: 167 mg/dL — ABNORMAL HIGH (ref 70–99)
Glucose-Capillary: 197 mg/dL — ABNORMAL HIGH (ref 70–99)
Glucose-Capillary: 255 mg/dL — ABNORMAL HIGH (ref 70–99)
Glucose-Capillary: 195 mg/dL — ABNORMAL HIGH (ref 70–99)
Glucose-Capillary: 242 mg/dL — ABNORMAL HIGH (ref 70–99)
Glucose-Capillary: 260 mg/dL — ABNORMAL HIGH (ref 70–99)
Glucose-Capillary: 186 mg/dL — ABNORMAL HIGH (ref 70–99)
Glucose-Capillary: 153 mg/dL — ABNORMAL HIGH (ref 70–99)

## 2012-10-09 LAB — CBC
HCT: 23.4 % — ABNORMAL LOW (ref 36.0–46.0)
Hemoglobin: 7.6 g/dL — ABNORMAL LOW (ref 12.0–15.0)
MCHC: 32.5 g/dL (ref 30.0–36.0)

## 2012-10-09 LAB — BASIC METABOLIC PANEL
BUN: 16 mg/dL (ref 6–23)
CO2: 33 mEq/L — ABNORMAL HIGH (ref 19–32)
Chloride: 103 mEq/L (ref 96–112)
GFR calc Af Amer: >90 mL/min (ref >90)
Potassium: 2.9 mEq/L — ABNORMAL LOW (ref 3.5–5.1)

## 2012-10-09 LAB — PROCALCITONIN: Procalcitonin: 1.38 ng/mL

## 2012-10-09 LAB — CULTURE, BLOOD (ROUTINE X 2)

## 2012-10-09 MED ORDER — INSULIN REGULAR HUMAN 100 UNIT/ML IJ SOLN
INTRAMUSCULAR | Status: DC
  Administered 2012-10-09: 20:00:00 via INTRAVENOUS
  Administered 2012-10-09: 2.2 [IU]/h via INTRAVENOUS
  Filled 2012-10-09: qty 1

## 2012-10-09 MED ORDER — SODIUM CHLORIDE 0.9 % IV SOLN
25.0000 ug/h | INTRAVENOUS | Status: DC
  Administered 2012-10-10 – 2012-10-11 (×2): 150 ug/h via INTRAVENOUS
  Filled 2012-10-09 (×4): qty 50

## 2012-10-09 MED ORDER — INSULIN GLARGINE 100 UNIT/ML ~~LOC~~ SOLN
30.0000 [IU] | SUBCUTANEOUS | Status: DC
  Administered 2012-10-09: 30 [IU] via SUBCUTANEOUS

## 2012-10-09 MED ORDER — DEXTROSE 10 % IV SOLN: INTRAVENOUS | Status: DC | PRN

## 2012-10-09 MED ORDER — MIDAZOLAM HCL 2 MG/2ML IJ SOLN
1.0000 mg | INTRAMUSCULAR | Status: DC | PRN
  Administered 2012-10-10: 2 mg via INTRAVENOUS

## 2012-10-09 MED ORDER — SODIUM CHLORIDE 0.9 % IV SOLN
1.0000 mg/h | INTRAVENOUS | Status: DC
  Administered 2012-10-10: 2 mg/h via INTRAVENOUS
  Filled 2012-10-09: qty 10

## 2012-10-09 MED ORDER — POTASSIUM CHLORIDE 10 MEQ/50ML IV SOLN
10.0000 meq | INTRAVENOUS | Status: AC
  Administered 2012-10-09 (×6): 10 meq via INTRAVENOUS
  Filled 2012-10-09: qty 300

## 2012-10-09 MED ORDER — INSULIN ASPART 100 UNIT/ML ~~LOC~~ SOLN
2.0000 [IU] | SUBCUTANEOUS | Status: DC
  Administered 2012-10-09: 4 [IU] via SUBCUTANEOUS
  Administered 2012-10-09: 6 [IU] via SUBCUTANEOUS

## 2012-10-09 MED ORDER — SODIUM CHLORIDE 0.9 % IV SOLN
1.0000 g | Freq: Once | INTRAVENOUS | Status: AC
  Administered 2012-10-09: 1 g via INTRAVENOUS
  Filled 2012-10-09: qty 10

## 2012-10-09 MED ORDER — VASOPRESSIN 20 UNIT/ML IJ SOLN
0.0300 [IU]/min | INTRAVENOUS | Status: DC
  Administered 2012-10-09 – 2012-10-10 (×2): 0.03 [IU]/min via INTRAVENOUS
  Filled 2012-10-09 (×2): qty 2.5

## 2012-10-09 NOTE — Progress Notes (Signed)
Name: [name] MRN: [id number] DOB: [birth year]    LOS: 2  Referring Provider:  EDP  Reason for Referral:  Status post arrest PCP - Maryelizabeth Rowan   PULMONARY / CRITICAL CARE MEDICINE  HPI:  71 y/o F, SNF resident, with PMH of HTN, COPD, DM, Hypothyroidism, cirrhosis, CVA (04) who presented to [facility] ED via EMS after being found unresponsive with ? pulse requiring approximately 5 minutes of CPR by SNF staff, attempts at intubation per EMS unsuccessful, CBG noted to be 27.  Upon arrival was intubated in ER.  Recent admit to [facility] 9/29 - 10/11 for AECOPD, Bipolar disorder with psychosis.  PCCM called for ICU admit.    Events Since Admission: 10/13 - Admit s/p CPR, CBG 27, intubated in ER 10/13 echo - nml EF, thick atrial septum  Current Status: intubated on vent, sedated Remains critically ill on pressors   Vital Signs: Temp:  [96.4 F (35.8 C)-99 F (37.2 C)] 99 F (37.2 C) (10/15 0900) Pulse Rate:  [57-92] 68  (10/15 0900) Resp:  [14-22] 14  (10/15 0900) BP: (67-142)/(35-74) 122/58 mmHg (10/15 0900) SpO2:  [93 %-100 %] 100 % (10/15 0900) FiO2 (%):  [40 %] 40 % (10/15 0805) Weight:  [68.2 kg (150 lb 5.7 oz)] 68.2 kg (150 lb 5.7 oz) (10/15 0500)  Physical Examination: General:  Elderly female in NAD on vent Neuro:  Awakens, follows commands , MAE HEENT:  Mm pink/moist Cardiovascular:  s1s2 rrr, distant tones Lungs:  Minimal inspiratory movement, coarse, OETT Abdomen:  Round/soft, bsx4 active Musculoskeletal:  No acute deformities Skin:  Cool / dry, 1-2+generalized edema, erythema over puncture sites rt forearm  Active Problems:  Septic shock(785.52)   ASSESSMENT AND PLAN  PULMONARY  Lab 10/08/12 0500 10/07/12 0827  PHART 7.419 7.357  PCO2ART 49.5* 74.1*  PO2ART 115.0* 494.0*  HCO3 31.4* 41.5*  O2SAT 98.0 100.0   Ventilator Settings: Vent Mode:  [-] PRVC FiO2 (%):  [40 %] 40 % Set Rate:  [14 bmp] 14 bmp Vt Set:  [600 mL] 600  mL PEEP:  [5 cmH20] 5 cmH20 Plateau Pressure:  [13 cmH20-14 cmH20] 13 cmH20 CXR:  10/13>>>RLL patchy airspace disease.  Endotracheal tube in good position ETT:  10/13>>>  A:   Acute Respiratory Failure Hx of COPD - recent exacerbation with admit Hypercarbia   P:   -full vent support 8cc / kg while on pressors -sedation protocol -No obvious bspasm -stress dose steroids -BD's  CARDIOVASCULAR  Lab 10/07/12 1945 10/07/12 1250 10/07/12 0920 10/07/12 0721  TROPONINI <0.30 <0.30 -- <0.30  LATICACIDVEN -- -- 1.1 --  PROBNP -- -- -- 465.5*   ECG:  Wnl, no st changes Lines: 10/13 R Fem TLC>>>10/14 [facility] 10/14 >>  A:  S/p cardiac arrest  Hypotension Hx HTN Hypothermia -resolved EKG -nSR  P:  -levophed for MAP >65 Add vasopressin -asa -    RENAL  Lab 10/09/12 0400 10/08/12 0500 10/07/12 0750 10/07/12 0721  NA 142 138 143 145  K 2.9* 3.9 -- --  CL 103 98 97 102  CO2 33* 33* -- 39*  BUN 16 17 18 18   CREATININE 0.44* 0.54 0.90 0.51  CALCIUM 7.4* 7.5* -- 8.8  MG 1.8 1.1* -- --  PHOS -- -- -- --   Intake/Output      10/14 0701 - 10/15 0700 10/15 0701 - 10/16 0700   I.V. (mL/kg) 2620.6 (38.4)    NG/GT 880    IV Piggyback 550  Total Intake(mL/kg) 4050.6 (59.4)    Urine (mL/kg/hr) 1915 (1.2)    Total Output 1915    Net +2135.6          Foley:  10/13>>>  A:  Hypomagnesemia / hypokalemia Baseline NML renal function / high risk ARF with hypotension  P:   -replete & rechk   GASTROINTESTINAL  Lab 10/07/12 0721  AST 27  ALT 27  ALKPHOS 50  BILITOT 0.5  PROT 5.2*  ALBUMIN 2.4*    A:  Protein calorie malnutrition   P:   -ct TF  -PPI  HEMATOLOGIC  Lab 10/09/12 0400 10/08/12 0500 10/07/12 0750 10/07/12 0721  HGB 7.6* 8.7* 10.2* 9.3*  HCT 23.4* 26.5* 30.0* 30.2*  PLT 190 237 -- 246  INR -- -- -- --  APTT -- -- -- --   A:   No evidence bleeding  P: Transfuse 1 U PRBC since in shock  INFECTIOUS  Lab 10/09/12 0400 10/08/12 0500 10/07/12 0845  10/07/12 0721  WBC 16.2* 23.4* -- 26.8*  PROCALCITON 1.38 -- <0.10 --   Cultures: 10/13 BCx2>>>GPC clusters 1/2 >> 10/13 UA>>>7-10 WCs 10/13 UC>>>ng 10/13 Sputum>>> Urine strep ag neg  Antibiotics: Vanco (empiric, SNF)>>> Zosyn (empiric, SNF)>>>  A:   Rule out sepsis -low pct reassuring  P:   -ct empiric abx   ENDOCRINE  Lab 10/09/12 0739 10/09/12 0355 10/09/12 0322 10/09/12 0210 10/09/12 0107  GLUCAP 248* 189* 157* 149* 168*   A:  Diabetes - on levemir 20 QD with SSI Hypothyroidism - on 175 mcg QD maintenance, TSH ok P:   -transitioned off insulin gtt with 30 u lantus  -resume PO synthroid -Stress dose steroids  NEUROLOGIC  A:   Bipolar Disorder - on zyprexa, valproate worked better per POA Hx of CVA - unclear residual effect Hx of ETOH abuse  P:   -resumed zyprexa -int versed, fentanyl gtt   BEST PRACTICE / DISPOSITION Level of Care:  ICU  Primary Service:  PCCM Consultants:   Code Status:  Full Code.  POA indicates she would be ok for short term support but not long term artifical support.  Onalee Hua will bring in POA papers Diet:  TFs  DVT Px:  lovenox GI Px:  Protonix Skin Integrity:  intact Social / Family:  Updated HCPOA - david  CC time 45 minutes    Cyril Mourning MD. FCCP.  Pulmonary & Critical care Pager [phone] If no response call [phone]   10/09/2012, 9:23 AM

## 2012-10-09 NOTE — Progress Notes (Signed)
ANTIBIOTIC CONSULT NOTE - FOLLOW UP  Pharmacy Consult for Vancomycin/zosyn Indication: Suspected HCAP  No Known Allergies  Patient Measurements: Height: 5' 4.96" (165 cm) Weight: 150 lb 5.7 oz (68.2 kg) IBW/kg (Calculated) : 56.91   Vital Signs: Temp: 99 F (37.2 C) (10/15 0900) Temp src: Core (Comment) (10/15 0800) BP: 122/58 mmHg (10/15 0900) Pulse Rate: 68  (10/15 0900) Intake/Output from previous day: 10/14 0701 - 10/15 0700 In: 4125.6 [I.V.:2695.6; NG/GT:880; IV Piggyback:550] Out: 1915 [Urine:1915] Intake/Output from this shift: Total I/O In: 225 [I.V.:225] Out: 150 [Urine:150]  Labs:  Basename 10/09/12 0400 10/08/12 0500 10/07/12 0750 10/07/12 0721  WBC 16.2* 23.4* -- 26.8*  HGB 7.6* 8.7* 10.2* --  PLT 190 237 -- 246  LABCREA -- -- -- --  CREATININE 0.44* 0.54 0.90 --   Estimated Creatinine Clearance: 57.9 ml/min (by C-G formula based on Cr of 0.44). No results found for this basename: VANCOTROUGH:2,VANCOPEAK:2,VANCORANDOM:2,GENTTROUGH:2,GENTPEAK:2,GENTRANDOM:2,TOBRATROUGH:2,TOBRAPEAK:2,TOBRARND:2,AMIKACINPEAK:2,AMIKACINTROU:2,AMIKACIN:2, in the last 72 hours   Microbiology: Recent Results (from the past 720 hour(s))  CULTURE, BLOOD (ROUTINE X 2)     Status: Normal (Preliminary result)   Collection Time   10/07/12  8:37 AM      Component Value Range Status Comment   Specimen Description BLOOD CENTRAL LINE   Final    Special Requests BOTTLES DRAWN AEROBIC AND ANAEROBIC 10CC   Final    Culture  Setup Time 10/07/2012 14:18   Final    Culture     Final    Value: GRAM POSITIVE COCCI IN CLUSTERS     Note: Gram Stain Report Called to,Read Back By and Verified With: SARA HERBERT 1203 10/08/12 BY KRAWS   Report Status PENDING   Incomplete   CULTURE, BLOOD (ROUTINE X 2)     Status: Normal (Preliminary result)   Collection Time   10/07/12 11:47 AM      Component Value Range Status Comment   Specimen Description BLOOD LEFT HAND   Final    Special Requests BOTTLES  DRAWN AEROBIC ONLY 2.5CC   Final    Culture  Setup Time 10/07/2012 21:35   Final    Culture     Final    Value:        BLOOD CULTURE RECEIVED NO GROWTH TO DATE CULTURE WILL BE HELD FOR 5 DAYS BEFORE ISSUING A FINAL NEGATIVE REPORT   Report Status PENDING   Incomplete   MRSA PCR SCREENING     Status: Normal   Collection Time   10/07/12 12:46 PM      Component Value Range Status Comment   MRSA by PCR NEGATIVE  NEGATIVE Final   URINE CULTURE     Status: Normal   Collection Time   10/07/12  1:00 PM      Component Value Range Status Comment   Specimen Description URINE, CATHETERIZED   Final    Special Requests NONE   Final    Culture  Setup Time 10/07/2012 21:41   Final    Colony Count NO GROWTH   Final    Culture NO GROWTH   Final    Report Status 10/08/2012 FINAL   Final   CULTURE, RESPIRATORY     Status: Normal (Preliminary result)   Collection Time   10/07/12  4:07 PM      Component Value Range Status Comment   Specimen Description TRACHEAL ASPIRATE   Final    Special Requests NONE   Final    Gram Stain     Final  Value: ABUNDANT WBC PRESENT, PREDOMINANTLY PMN     NO SQUAMOUS EPITHELIAL CELLS SEEN     FEW GRAM POSITIVE RODS   Culture     Final    Value: ABUNDANT DIPHTHEROIDS(CORYNEBACTERIUM SPECIES)     Note: Standardized susceptibility testing for this organism is not available.   Report Status PENDING   Incomplete     Anti-infectives     Start     Dose/Rate Route Frequency Ordered Stop   10/07/12 2300   vancomycin (VANCOCIN) 500 mg in sodium chloride 0.9 % 100 mL IVPB        500 mg 100 mL/hr over 60 Minutes Intravenous Every 12 hours 10/07/12 1051     10/07/12 1100  piperacillin-tazobactam (ZOSYN) IVPB 3.375 g       3.375 g 12.5 mL/hr over 240 Minutes Intravenous 3 times per day 10/07/12 1050     10/07/12 1100   vancomycin (VANCOCIN) 1,250 mg in sodium chloride 0.9 % 250 mL IVPB        1,250 mg 166.7 mL/hr over 90 Minutes Intravenous NOW 10/07/12 1050 10/07/12 1626    10/07/12 0900   piperacillin-tazobactam (ZOSYN) IVPB 3.375 g  Status:  Discontinued        3.375 g 100 mL/hr over 30 Minutes Intravenous  Once 10/07/12 0853 10/07/12 1049   10/07/12 0900   vancomycin (VANCOCIN) 1,250 mg in sodium chloride 0.9 % 250 mL IVPB  Status:  Discontinued        1,250 mg 166.7 mL/hr over 90 Minutes Intravenous To Emergency Dept 10/07/12 0854 10/07/12 1050          Assessment: [name] is a [facility] resident with a hx of recent admission to an OSH for AECOPD. Noted she was found unresponsive at SNF today and s/p 5 minutes of CPR per SNF staff. She is now intubated and on levophed.  Patient is now afebrille with declining wbc, pct 1.38. Culture data now reporting. Current abx dosing still appropriate. Abx: vanc 10/13>> Zosyn 10/13>> Cxs: 10/13 Trach asp - abundant diphtheroids 10/13 bld - gpc 1/2 10/13 urine -ng  Goal of Therapy:  Vancomycin trough level 15-20 mcg/ml  Plan:  Continue vancomycin 500mg  IV q12h Plan for trough next 1-2 days Continue zosyn 3.375g IV q8 hours Follow up final results of cultures [name] 10/09/2012,10:16 AM

## 2012-10-10 ENCOUNTER — Inpatient Hospital Stay (HOSPITAL_COMMUNITY): Payer: Medicare Other

## 2012-10-10 LAB — CULTURE, RESPIRATORY W GRAM STAIN

## 2012-10-10 LAB — GLUCOSE, CAPILLARY
Glucose-Capillary: 171 mg/dL — ABNORMAL HIGH (ref 70–99)
Glucose-Capillary: 185 mg/dL — ABNORMAL HIGH (ref 70–99)
Glucose-Capillary: 191 mg/dL — ABNORMAL HIGH (ref 70–99)

## 2012-10-10 LAB — BASIC METABOLIC PANEL
BUN: 26 mg/dL — ABNORMAL HIGH (ref 6–23)
Calcium: 7.7 mg/dL — ABNORMAL LOW (ref 8.4–10.5)
Chloride: 102 mEq/L (ref 96–112)
Creatinine, Ser: 0.46 mg/dL — ABNORMAL LOW (ref 0.50–1.10)
GFR calc Af Amer: >90 mL/min (ref >90)
GFR calc non Af Amer: >90 mL/min (ref >90)

## 2012-10-10 LAB — TYPE AND SCREEN
ABO/RH(D): O POS
Antibody Screen: NEGATIVE

## 2012-10-10 LAB — CBC
HCT: 27.1 % — ABNORMAL LOW (ref 36.0–46.0)
MCHC: 32.8 g/dL (ref 30.0–36.0)
Platelets: 147 10*3/uL — ABNORMAL LOW (ref 150–400)
RDW: 15.6 % — ABNORMAL HIGH (ref 11.5–15.5)

## 2012-10-10 LAB — PROCALCITONIN: Procalcitonin: 0.65 ng/mL

## 2012-10-10 MED ORDER — DEXTROSE 10 % IV SOLN: INTRAVENOUS | Status: DC | PRN

## 2012-10-10 MED ORDER — SODIUM CHLORIDE 0.9 % IV SOLN
1.0000 g | Freq: Once | INTRAVENOUS | Status: AC
  Administered 2012-10-10: 1 g via INTRAVENOUS
  Filled 2012-10-10: qty 10

## 2012-10-10 MED ORDER — INSULIN ASPART 100 UNIT/ML ~~LOC~~ SOLN
0.0000 [IU] | Freq: Three times a day (TID) | SUBCUTANEOUS | Status: DC
  Administered 2012-10-10: 3 [IU] via SUBCUTANEOUS

## 2012-10-10 MED ORDER — INSULIN ASPART 100 UNIT/ML ~~LOC~~ SOLN: 1.0000 [IU] | SUBCUTANEOUS | Status: DC

## 2012-10-10 MED ORDER — INSULIN ASPART 100 UNIT/ML ~~LOC~~ SOLN
0.0000 [IU] | Freq: Every day | SUBCUTANEOUS | Status: DC
  Administered 2012-10-10 (×2): 3 [IU] via SUBCUTANEOUS
  Administered 2012-10-11: 2 [IU] via SUBCUTANEOUS
  Administered 2012-10-11 (×2): 3 [IU] via SUBCUTANEOUS
  Administered 2012-10-11: 2 [IU] via SUBCUTANEOUS
  Administered 2012-10-12: 3 [IU] via SUBCUTANEOUS
  Administered 2012-10-12: 5 [IU] via SUBCUTANEOUS
  Administered 2012-10-13: 3 [IU] via SUBCUTANEOUS
  Administered 2012-10-13: 2 [IU] via SUBCUTANEOUS
  Administered 2012-10-13: 11 [IU] via SUBCUTANEOUS
  Administered 2012-10-13: 2 [IU] via SUBCUTANEOUS
  Administered 2012-10-14: 3 [IU] via SUBCUTANEOUS

## 2012-10-10 MED ORDER — VANCOMYCIN HCL IN DEXTROSE 1-5 GM/200ML-% IV SOLN
1000.0000 mg | INTRAVENOUS | Status: AC
  Administered 2012-10-10: 1000 mg via INTRAVENOUS
  Filled 2012-10-10: qty 200

## 2012-10-10 MED ORDER — VANCOMYCIN HCL IN DEXTROSE 1-5 GM/200ML-% IV SOLN
1000.0000 mg | Freq: Two times a day (BID) | INTRAVENOUS | Status: DC
Start: 2012-10-11 — End: 2012-10-13
  Administered 2012-10-11 – 2012-10-13 (×5): 1000 mg via INTRAVENOUS
  Filled 2012-10-10 (×6): qty 200

## 2012-10-10 MED ORDER — INSULIN ASPART 100 UNIT/ML ~~LOC~~ SOLN
1.0000 [IU] | Freq: Three times a day (TID) | SUBCUTANEOUS | Status: DC
  Administered 2012-10-10 – 2012-10-15 (×10): 1 [IU] via SUBCUTANEOUS

## 2012-10-10 MED ORDER — INSULIN GLARGINE 100 UNIT/ML ~~LOC~~ SOLN
10.0000 [IU] | SUBCUTANEOUS | Status: DC
  Administered 2012-10-10 – 2012-10-11 (×2): 10 [IU] via SUBCUTANEOUS

## 2012-10-10 NOTE — Progress Notes (Addendum)
eLink Physician-Brief Progress Note Patient Name: [name] DOB: 09-18-1941 MRN: [id number]  Date of Service  10/10/2012   HPI/Events of Note   Rn noticed Rt gluteal hematoma. BP./HR ok  eICU Interventions  Check cbc now   Lab 10/10/12 0126 10/09/12 0400 10/08/12 0500  HGB 8.9* 7.6* 8.7*  HCT 27.1* 23.4* 26.5*  WBC 14.1* 16.2* 23.4*  PLT 147* 190 237   Hgb unchnaged. Will monitor      Vanessa Kampf 10/10/2012, 1:19 AM

## 2012-10-10 NOTE — Progress Notes (Signed)
Name: [name] MRN: [id number] DOB: Oct 18, 1941    LOS: 3  Referring Provider:  EDP  Reason for Referral:  Status post arrest PCP - Maryelizabeth Rowan   PULMONARY / CRITICAL CARE MEDICINE  HPI:  71 y/o F, SNF resident, with PMH of HTN, COPD, DM, Hypothyroidism, cirrhosis, CVA (04) who presented to [facility] ED via EMS after being found unresponsive with ? pulse requiring approximately 5 minutes of CPR by SNF staff, attempts at intubation per EMS unsuccessful, CBG noted to be 27.  Upon arrival was intubated in ER.  Recent admit to [facility] 9/29 - 10/11 for AECOPD, Bipolar disorder with psychosis.  PCCM called for ICU admit.    Events Since Admission: 10/13 - Admit s/p CPR, CBG 27, intubated in ER 10/13 echo - nml EF, thick atrial septum 10/15 Object removed from ETT. Appeared to be a small oval shaped seed, like the ones found in green beans large hematoma noted on the pt's rt. Lateral thigh   Current Status: intubated on vent, sedated with int agitation Remains critically ill on pressors afebrile   Vital Signs: Temp:  [97 F (36.1 C)-99 F (37.2 C)] 97 F (36.1 C) (10/16 0800) Pulse Rate:  [45-116] 50  (10/16 0845) Resp:  [13-23] 14  (10/16 0845) BP: (84-159)/(46-87) 135/61 mmHg (10/16 0845) SpO2:  [88 %-100 %] 100 % (10/16 0845) FiO2 (%):  [40 %] 40 % (10/16 0845) Weight:  [71 kg (156 lb 8.4 oz)] 71 kg (156 lb 8.4 oz) (10/16 0400)  Physical Examination: General:  Elderly female in NAD on vent Neuro:  Awakens, follows commands , MAE HEENT:  Mm pink/moist Cardiovascular:  s1s2 rrr, distant tones Lungs:  Minimal inspiratory movement, coarse, OETT Abdomen:  Round/soft, bsx4 active Musculoskeletal:  No acute deformities, large hematoma noted on the pt's rt. Lateral thigh Skin:  Cool / dry, 1-2+generalized edema, erythema over puncture sites rt forearm  Active Problems:  Septic shock(785.52)   ASSESSMENT AND PLAN  PULMONARY  Lab 10/08/12 0500  10/07/12 0827  PHART 7.419 7.357  PCO2ART 49.5* 74.1*  PO2ART 115.0* 494.0*  HCO3 31.4* 41.5*  O2SAT 98.0 100.0   Ventilator Settings: Vent Mode:  [-] PRVC FiO2 (%):  [40 %] 40 % Set Rate:  [14 bmp] 14 bmp Vt Set:  [600 mL] 600 mL PEEP:  [5 cmH20] 5 cmH20 Plateau Pressure:  [13 cmH20-15 cmH20] 14 cmH20 CXR:  10/13>>>RLL patchy airspace disease.  Endotracheal tube in good position ETT:  10/13>>>  A:   Acute Respiratory Failure Hx of COPD - recent exacerbation with admit Hypercarbia   P:   -SBTs on pressors ok -sedation protocol -No obvious bspasm -stress dose steroids -BD's  CARDIOVASCULAR  Lab 10/07/12 1945 10/07/12 1250 10/07/12 0920 10/07/12 0721  TROPONINI <0.30 <0.30 -- <0.30  LATICACIDVEN -- -- 1.1 --  PROBNP -- -- -- 465.5*   ECG:  Wnl, no st changes Lines: 10/13 R Fem TLC>>>10/14 [facility] 10/14 >>  A:  S/p cardiac arrest  Hypotension Hx HTN Hypothermia -resolved EKG -nSR  P:  -levophed for MAP >65 ct vasopressin -asa -    RENAL  Lab 10/10/12 0126 10/09/12 0400 10/08/12 0500 10/07/12 0750 10/07/12 0721  NA 138 142 138 143 145  K 3.5 2.9* -- -- --  CL 102 103 98 97 102  CO2 32 33* 33* -- 39*  BUN 26* 16 17 18 18   CREATININE 0.46* 0.44* 0.54 0.90 0.51  CALCIUM 7.7* 7.4* 7.5* -- 8.8  MG --  1.8 1.1* -- --  PHOS -- -- -- -- --   Intake/Output      10/15 0701 - 10/16 0700 10/16 0701 - 10/17 0700   I.V. (mL/kg) 1262.4 (17.8) 50.6 (0.7)   Blood 312.5    NG/GT 1030 130   IV Piggyback 647.5    Total Intake(mL/kg) 3252.4 (45.8) 180.6 (2.5)   Urine (mL/kg/hr) 830 (0.5) 50   Total Output 830 50   Net +2422.4 +130.6         Foley:  10/13>>>  A:  Hypomagnesemia / hypokalemia/ hypocalcemia Baseline NML renal function / high risk ARF with hypotension  P:   -replete & rechk  -lasix once off pressors  GASTROINTESTINAL  Lab 10/07/12 0721  AST 27  ALT 27  ALKPHOS 50  BILITOT 0.5  PROT 5.2*  ALBUMIN 2.4*    A:  Protein calorie  malnutrition   P:   -ct TF  -PPI  HEMATOLOGIC  Lab 10/10/12 0126 10/09/12 0400 10/08/12 0500 10/07/12 0750 10/07/12 0721  HGB 8.9* 7.6* 8.7* 10.2* 9.3*  HCT 27.1* 23.4* 26.5* 30.0* 30.2*  PLT 147* 190 237 -- 246  INR -- -- -- -- --  APTT -- -- -- -- --   A:  Rt gluteal hematoma -stable  P: improved post Transfusion dc lovenox -scds  INFECTIOUS  Lab 10/10/12 0126 10/09/12 0400 10/08/12 0500 10/07/12 0845 10/07/12 0721  WBC 14.1* 16.2* 23.4* -- 26.8*  PROCALCITON 0.65 1.38 -- <0.10 --   Cultures: 10/13 BCx2>>>coag neg staph 1/2 10/13 UA>>>7-10 WCs 10/13 UC>>>ng 10/13 Sputum>>> diphtheroids Urine strep ag neg  Antibiotics: Vanco (empiric, SNF) 10/13 >>> Zosyn (empiric, SNF) 10/13>>> 10/16  A:   Rule out sepsis -low pct reassuring  P:   -ct vanc, dc zosyn   ENDOCRINE  Lab 10/10/12 0718 10/10/12 0613 10/10/12 0503 10/10/12 0357 10/10/12 0254  GLUCAP 129* 122* 147* 185* 191*   A:  Diabetes - on levemir 20 QD with SSI Hypothyroidism - on 175 mcg QD maintenance, TSH ok P:   -transition off insulin gtt with lantus when able -ct PO synthroid -Stress dose steroids  NEUROLOGIC  A:   Bipolar Disorder - on zyprexa, valproate worked better per POA Hx of CVA - unclear residual effect Hx of ETOH abuse  P:   -resumed zyprexa -int versed, fentanyl gtt   BEST PRACTICE / DISPOSITION Level of Care:  ICU  Primary Service:  PCCM Consultants:   Code Status:  Full Code.  POA indicates she would be ok for short term support but not long term artifical support.  Onalee Hua will bring in POA papers Diet:  TFs  DVT Px:  lovenox GI Px:  Protonix Skin Integrity:  intact Social / Family:  Updated HCPOA - david  CC time 40 minutes    Cyril Mourning MD. FCCP. [place] [facility] Pager [phone] If no response call [phone]   10/10/2012, 9:57 AM

## 2012-10-10 NOTE — Progress Notes (Signed)
MD called to notify of stabilizing blood pressures since Levophed being off; ordered to turn vasopressin gtt off;

## 2012-10-10 NOTE — Progress Notes (Signed)
ANTIBIOTIC CONSULT NOTE - FOLLOW UP  Pharmacy Consult for vancomycin Indication: pneumonia  Labs:  Basename 10/10/12 0126 10/09/12 0400 10/08/12 0500  WBC 14.1* 16.2* 23.4*  HGB 8.9* 7.6* 8.7*  PLT 147* 190 237  LABCREA -- -- --  CREATININE 0.46* 0.44* 0.54   Estimated Creatinine Clearance: 63.6 ml/min (by C-G formula based on Cr of 0.46).  Basename 10/10/12 2220  VANCOTROUGH 9.2*  VANCOPEAK --  VANCORANDOM --  GENTTROUGH --  GENTPEAK --  GENTRANDOM --  TOBRATROUGH --  TOBRAPEAK --  TOBRARND --  AMIKACINPEAK --  AMIKACINTROU --  AMIKACIN --     Assessment: 71yo female in ICU for respiratory failure, bibasilar opacities beginning to improve on CXR, subtherapeutic on vancomycin with initial dosing.  Goal of Therapy:  Vancomycin trough level 15-20 mcg/ml  Plan:  Will change vancomycin to 1000mg  IV Q12H for calculated trough of ~17 and continue to monitor.  Colleen Can PharmD BCPS 10/10/2012,11:08 PM

## 2012-10-10 NOTE — Progress Notes (Signed)
2 cc of Versed wasted in sink. Witnessed by [employer].Daney Moor [place]

## 2012-10-10 NOTE — Progress Notes (Signed)
A large hematoma noted on the pt's rt. Lateral thigh. Site is bruised and hard.Pt nodded when asked if the area is tender. Area marked. Dr. Marchelle Gearing notified with orders to draw am labs.Kandance Yano [place]

## 2012-10-10 NOTE — Progress Notes (Signed)
Current MAP 56. Called Elink MD to clarify order for Levo and Vaso. Dr. Herma Carson ordered to keep MAP greater than 55. Levo and Vaso remain off. [name]

## 2012-10-10 NOTE — Progress Notes (Signed)
Object removed from ETT. Appeared to be a small oval shaped seed, like the ones found in green beans.

## 2012-10-11 ENCOUNTER — Inpatient Hospital Stay (HOSPITAL_COMMUNITY): Payer: Medicare Other

## 2012-10-11 LAB — BASIC METABOLIC PANEL
BUN: 27 mg/dL — ABNORMAL HIGH (ref 6–23)
CO2: 37 mEq/L — ABNORMAL HIGH (ref 19–32)
Calcium: 8.3 mg/dL — ABNORMAL LOW (ref 8.4–10.5)
Chloride: 103 mEq/L (ref 96–112)
Chloride: 104 mEq/L (ref 96–112)
Glucose, Bld: 167 mg/dL — ABNORMAL HIGH (ref 70–99)
Glucose, Bld: 139 mg/dL — ABNORMAL HIGH (ref 70–99)
Potassium: 2.8 mEq/L — ABNORMAL LOW (ref 3.5–5.1)
Sodium: 147 mEq/L — ABNORMAL HIGH (ref 135–145)

## 2012-10-11 LAB — CBC
HCT: 28.1 % — ABNORMAL LOW (ref 36.0–46.0)
Hemoglobin: 9.3 g/dL — ABNORMAL LOW (ref 12.0–15.0)
MCH: 30.4 pg (ref 26.0–34.0)
MCHC: 33.1 g/dL (ref 30.0–36.0)

## 2012-10-11 LAB — GLUCOSE, CAPILLARY
Glucose-Capillary: 154 mg/dL — ABNORMAL HIGH (ref 70–99)
Glucose-Capillary: 136 mg/dL — ABNORMAL HIGH (ref 70–99)
Glucose-Capillary: 91 mg/dL (ref 70–99)

## 2012-10-11 MED ORDER — PANTOPRAZOLE SODIUM 40 MG PO TBEC: 40.0000 mg | DELAYED_RELEASE_TABLET | Freq: Every day | ORAL | Status: DC

## 2012-10-11 MED ORDER — FUROSEMIDE 10 MG/ML IJ SOLN
INTRAMUSCULAR | Status: AC
  Administered 2012-10-11: 40 mg via INTRAVENOUS
  Filled 2012-10-11: qty 4

## 2012-10-11 MED ORDER — FUROSEMIDE 10 MG/ML IJ SOLN
40.0000 mg | Freq: Once | INTRAMUSCULAR | Status: AC
  Administered 2012-10-11: 40 mg via INTRAVENOUS

## 2012-10-11 MED ORDER — IPRATROPIUM BROMIDE 0.02 % IN SOLN
0.5000 mg | RESPIRATORY_TRACT | Status: DC
  Administered 2012-10-11 – 2012-10-12 (×6): 0.5 mg via RESPIRATORY_TRACT
  Filled 2012-10-11 (×7): qty 2.5

## 2012-10-11 MED ORDER — HYDROCORTISONE SOD SUCCINATE 100 MG IJ SOLR
50.0000 mg | Freq: Three times a day (TID) | INTRAMUSCULAR | Status: DC
  Administered 2012-10-11 – 2012-10-12 (×2): 50 mg via INTRAVENOUS
  Filled 2012-10-11 (×5): qty 1

## 2012-10-11 MED ORDER — POTASSIUM CHLORIDE 10 MEQ/50ML IV SOLN
10.0000 meq | INTRAVENOUS | Status: AC
  Administered 2012-10-11 (×3): 10 meq via INTRAVENOUS
  Filled 2012-10-11: qty 150

## 2012-10-11 MED ORDER — ALBUTEROL SULFATE (5 MG/ML) 0.5% IN NEBU
2.5000 mg | INHALATION_SOLUTION | RESPIRATORY_TRACT | Status: DC
  Administered 2012-10-11 – 2012-10-14 (×20): 2.5 mg via RESPIRATORY_TRACT
  Filled 2012-10-11 (×20): qty 0.5

## 2012-10-11 MED ORDER — POTASSIUM CHLORIDE 10 MEQ/100ML IV SOLN: 10.0000 meq | INTRAVENOUS | Status: DC

## 2012-10-11 MED ORDER — POTASSIUM CHLORIDE 10 MEQ/50ML IV SOLN
10.0000 meq | INTRAVENOUS | Status: AC
  Administered 2012-10-11 – 2012-10-12 (×6): 10 meq via INTRAVENOUS
  Filled 2012-10-11: qty 150

## 2012-10-11 MED ORDER — POTASSIUM CHLORIDE 10 MEQ/50ML IV SOLN
INTRAVENOUS | Status: AC
  Administered 2012-10-11: 10 meq via INTRAVENOUS
  Filled 2012-10-11: qty 150

## 2012-10-11 MED ORDER — POTASSIUM CHLORIDE 20 MEQ/15ML (10%) PO LIQD
40.0000 meq | Freq: Once | ORAL | Status: AC
  Filled 2012-10-11: qty 30

## 2012-10-11 MED ORDER — HALOPERIDOL LACTATE 5 MG/ML IJ SOLN
1.0000 mg | INTRAMUSCULAR | Status: DC | PRN
  Filled 2012-10-11: qty 0.8

## 2012-10-11 MED ORDER — POTASSIUM CHLORIDE CRYS ER 20 MEQ PO TBCR
EXTENDED_RELEASE_TABLET | ORAL | Status: AC
  Administered 2012-10-11: 40 meq
  Filled 2012-10-11: qty 2

## 2012-10-11 NOTE — Procedures (Signed)
Extubation Procedure Note  Patient Details:   Name: [name] DOB: 24-Jul-1941 MRN: [id number]   Airway Documentation:  Patient weaned on PSV this am with comfortable volume and rate. Cuff leak good. Extubated and nasal cannula placed on pt with oxygen running at 4lpm.  Evaluation  O2 sats: stable throughout Complications: No apparent complications Patient did tolerate procedure well. Bilateral Breath Sounds: Clear Suctioning: Oral Yes  Clearance Coots 10/11/2012, 10:36 AM

## 2012-10-11 NOTE — Progress Notes (Signed)
WUA: all sedation turned off; pt alert and following commands; pt moving all extremities; MD notified; sedation kept off

## 2012-10-11 NOTE — Clinical Social Work Note (Signed)
Clinical Social Worker is continuing to follow. [facility] SNF will be able to receive patient back, when medically stable.   Rozetta Nunnery MSW, [employer] [phone]

## 2012-10-11 NOTE — Progress Notes (Signed)
Name: [name] MRN: [id number] DOB: October 11, 1941    LOS: 4  Referring Provider:  EDP  Reason for Referral:  Status post arrest PCP - Maryelizabeth Rowan   PULMONARY / CRITICAL CARE MEDICINE  HPI:  71 y/o F, SNF resident, with PMH of HTN, COPD, DM, Hypothyroidism, cirrhosis, CVA (04) who presented to [facility] ED via EMS after being found unresponsive with ? pulse requiring approximately 5 minutes of CPR by SNF staff, attempts at intubation per EMS unsuccessful, CBG noted to be 27.  Upon arrival was intubated in ER.  Recent admit to [facility] 9/29 - 10/11 for AECOPD, Bipolar disorder with psychosis.  PCCM called for ICU admit.    Events Since Admission: 10/13 - Admit s/p CPR, CBG 27, intubated in ER 10/13 echo - nml EF, thick atrial septum 10/15 Object removed from ETT. Appeared to be a small oval shaped seed, like the ones found in green beans large hematoma noted on the pt's rt. Lateral thigh   Current Status: intubated on vent, sedated with int agitation Remains critically ill , off  pressors afebrile   Vital Signs: Temp:  [97.2 F (36.2 C)-98.8 F (37.1 C)] 97.3 F (36.3 C) (10/17 0834) Pulse Rate:  [56-92] 58  (10/17 0834) Resp:  [13-24] 17  (10/17 0834) BP: (72-134)/(30-81) 125/64 mmHg (10/17 0834) SpO2:  [97 %-100 %] 99 % (10/17 0834) FiO2 (%):  [40 %] 40 % (10/17 0852) Weight:  [73.5 kg (162 lb 0.6 oz)] 73.5 kg (162 lb 0.6 oz) (10/17 0500)  Physical Examination: General:  Elderly female in NAD on vent Neuro:  Awakens, follows commands , MAE HEENT:  Mm pink/moist Cardiovascular:  s1s2 rrr, distant tones Lungs:  Minimal inspiratory movement, coarse, OETT Abdomen:  Round/soft, bsx4 active Musculoskeletal:  No acute deformities, large hematoma noted on the pt's rt. Lateral thigh Skin:  Cool / dry, 1-2+generalized edema, erythema over puncture sites rt forearm  Active Problems:  Septic shock(785.52)   ASSESSMENT AND PLAN  PULMONARY  Lab  10/08/12 0500 10/07/12 0827  PHART 7.419 7.357  PCO2ART 49.5* 74.1*  PO2ART 115.0* 494.0*  HCO3 31.4* 41.5*  O2SAT 98.0 100.0   Ventilator Settings: Vent Mode:  [-] PSV FiO2 (%):  [40 %] 40 % Set Rate:  [14 bmp] 14 bmp Vt Set:  [600 mL] 600 mL PEEP:  [5 cmH20] 5 cmH20 Pressure Support:  [5 cmH20-10 cmH20] 10 cmH20 Plateau Pressure:  [12 cmH20-18 cmH20] 18 cmH20 CXR:  10/13>>>small effusions & basilar ASD.  Endotracheal tube in good position ETT:  10/13>>>  A:   Acute Respiratory Failure Hx of COPD - recent exacerbation with admit Hypercarbia   P:   -SBTs - trial of extubation -No obvious bspasm -stress dose steroids -BD's  CARDIOVASCULAR  Lab 10/07/12 1945 10/07/12 1250 10/07/12 0920 10/07/12 0721  TROPONINI <0.30 <0.30 -- <0.30  LATICACIDVEN -- -- 1.1 --  PROBNP -- -- -- 465.5*   ECG:  Wnl, no st changes Lines: 10/13 R Fem TLC>>>10/14 [facility] 10/14 >>  A:  S/p cardiac arrest  Hypotension Hx HTN Hypothermia -resolved EKG -nSR  P:  Off pressors -asa -    RENAL  Lab 10/11/12 0427 10/10/12 0126 10/09/12 0400 10/08/12 0500 10/07/12 0750 10/07/12 0721  NA 143 138 142 138 143 --  K 2.8* 3.5 -- -- -- --  CL 103 102 103 98 97 --  CO2 32 32 33* 33* -- 39*  BUN 27* 26* 16 17 18  --  CREATININE 0.45*  0.46* 0.44* 0.54 0.90 --  CALCIUM 8.1* 7.7* 7.4* 7.5* -- 8.8  MG -- -- 1.8 1.1* -- --  PHOS -- -- -- -- -- --   Intake/Output      10/16 0701 - 10/17 0700 10/17 0701 - 10/18 0700   I.V. (mL/kg) 728.9 (9.9)    Blood     NG/GT 965    IV Piggyback 200    Total Intake(mL/kg) 1893.9 (25.8)    Urine (mL/kg/hr) 1590 (0.9) 50   Total Output 1590 50   Net +303.9 -50         Foley:  10/13>>>  A:  Hypomagnesemia / hypokalemia/ hypocalcemia Baseline NML renal function / high risk ARF with hypotension  P:   -replete & rechk  -lasix 40 now  GASTROINTESTINAL  Lab 10/07/12 0721  AST 27  ALT 27  ALKPHOS 50  BILITOT 0.5  PROT 5.2*  ALBUMIN 2.4*    A:   Protein calorie malnutrition   P:   -swallow evaln -PPI  HEMATOLOGIC  Lab 10/11/12 0427 10/10/12 0126 10/09/12 0400 10/08/12 0500 10/07/12 0750 10/07/12 0721  HGB 9.3* 8.9* 7.6* 8.7* 10.2* --  HCT 28.1* 27.1* 23.4* 26.5* 30.0* --  PLT 152 147* 190 237 -- 246  INR -- -- -- -- -- --  APTT -- -- -- -- -- --   A:  Rt gluteal hematoma -stable  P: improved post Transfusion dc lovenox -scds  INFECTIOUS  Lab 10/11/12 0427 10/10/12 0126 10/09/12 0400 10/08/12 0500 10/07/12 0845 10/07/12 0721  WBC 11.1* 14.1* 16.2* 23.4* -- 26.8*  PROCALCITON -- 0.65 1.38 -- <0.10 --   Cultures: 10/13 BCx2>>>coag neg staph 1/2 10/13 UA>>>7-10 WCs 10/13 UC>>>ng 10/13 Sputum>>> diphtheroids Urine strep ag neg  Antibiotics: Vanco (empiric, SNF) 10/13 >>> Zosyn (empiric, SNF) 10/13>>> 10/16  A:   Rule out sepsis -low pct reassuring  P:   -ct vanc, dc zosyn   ENDOCRINE  Lab 10/11/12 0806 10/11/12 0338 10/10/12 2343 10/10/12 1920 10/10/12 1516  GLUCAP 133* 167* 157* 172* 153*   A:  Diabetes - on levemir 20 QD with SSI Hypothyroidism - on 175 mcg QD maintenance, TSH ok P:   -ct lantus  -ct PO synthroid -Stress dose steroids - taper to home dose pred eventually  NEUROLOGIC  A:   Bipolar Disorder - on zyprexa, valproate worked better per POA Hx of CVA - unclear residual effect Hx of ETOH abuse  P:   -resumed zyprexa -dc sedation -haldol prn   BEST PRACTICE / DISPOSITION Level of Care:  ICU  Primary Service:  PCCM Consultants:   Code Status:  Full Code.  POA indicates she would be ok for short term support but not long term artifical support.  Onalee Hua updated 10/17 Diet:  TFs  DVT Px:  lovenox GI Px:  Protonix Skin Integrity:  intact Social / Family:  Updated HCPOA - david  CC time 40 minutes    Cyril Mourning MD. FCCP. [place] [facility] Pager [phone] If no response call [phone]   10/11/2012, 9:46 AM

## 2012-10-11 NOTE — Progress Notes (Signed)
MD aware of recent K+ on BMET; orders received;

## 2012-10-12 LAB — BASIC METABOLIC PANEL
CO2: 35 mEq/L — ABNORMAL HIGH (ref 19–32)
Chloride: 106 mEq/L (ref 96–112)
Creatinine, Ser: 0.46 mg/dL — ABNORMAL LOW (ref 0.50–1.10)
GFR calc Af Amer: >90 mL/min (ref >90)
Potassium: 3.6 mEq/L (ref 3.5–5.1)

## 2012-10-12 LAB — GLUCOSE, CAPILLARY: Glucose-Capillary: 104 mg/dL — ABNORMAL HIGH (ref 70–99)

## 2012-10-12 LAB — CBC
HCT: 28.8 % — ABNORMAL LOW (ref 36.0–46.0)
Platelets: 137 10*3/uL — ABNORMAL LOW (ref 150–400)
RDW: 15.5 % (ref 11.5–15.5)
WBC: 9.5 10*3/uL (ref 4.0–10.5)

## 2012-10-12 MED ORDER — PREDNISONE 20 MG PO TABS
40.0000 mg | ORAL_TABLET | Freq: Every day | ORAL | Status: DC
  Administered 2012-10-13 – 2012-10-15 (×3): 40 mg via ORAL
  Filled 2012-10-12 (×5): qty 2

## 2012-10-12 MED ORDER — FUROSEMIDE 20 MG PO TABS
20.0000 mg | ORAL_TABLET | Freq: Every day | ORAL | Status: DC
  Administered 2012-10-12: 20 mg via ORAL
  Filled 2012-10-12 (×2): qty 1

## 2012-10-12 MED ORDER — INSULIN GLARGINE 100 UNIT/ML ~~LOC~~ SOLN
5.0000 [IU] | SUBCUTANEOUS | Status: DC
  Administered 2012-10-12 – 2012-10-13 (×2): 5 [IU] via SUBCUTANEOUS

## 2012-10-12 MED ORDER — MAGNESIUM OXIDE 400 (241.3 MG) MG PO TABS
200.0000 mg | ORAL_TABLET | Freq: Two times a day (BID) | ORAL | Status: DC
  Administered 2012-10-12 – 2012-10-15 (×7): 200 mg via ORAL
  Filled 2012-10-12 (×9): qty 0.5

## 2012-10-12 MED ORDER — TIOTROPIUM BROMIDE MONOHYDRATE 18 MCG IN CAPS
18.0000 ug | ORAL_CAPSULE | Freq: Every day | RESPIRATORY_TRACT | Status: DC
  Administered 2012-10-12 – 2012-10-15 (×4): 18 ug via RESPIRATORY_TRACT
  Filled 2012-10-12: qty 5

## 2012-10-12 MED ORDER — BUDESONIDE 0.25 MG/2ML IN SUSP
0.2500 mg | Freq: Two times a day (BID) | RESPIRATORY_TRACT | Status: DC
  Administered 2012-10-12 – 2012-10-15 (×7): 0.25 mg via RESPIRATORY_TRACT
  Filled 2012-10-12 (×9): qty 2

## 2012-10-12 NOTE — Evaluation (Signed)
Clinical/Bedside Swallow Evaluation Patient Details  Name: [name] MRN: [id number] Date of Birth: 1941-04-13  Today's Date: 10/12/2012 Time: 8657-8469 SLP Time Calculation (min): 21 min  Past Medical History:  Past Medical History  Diagnosis Date  . Hypertension   . COPD (chronic obstructive pulmonary disease)   . Diabetes mellitus   . Elevated cholesterol   . History of ETOH abuse   . Hypothyroid   . Cirrhosis, alcoholic   . Psychosis   . Lymphoma     T cell  . Stroke     Occipital 2004  . Asthma    Past Surgical History: History reviewed. No pertinent past surgical history. HPI:  71 y/o F, SNF resident, with PMH of HTN, COPD, DM, Hypothyroidism, cirrhosis, CVA (04) who presented to [facility] ED via EMS after being found unresponsive with ? pulse requiring approximately 5 minutes of CPR by SNF staff, attempts at intubation per EMS unsuccessful, CBG noted to be 27.  Upon arrival was intubated in ER.  Recent admit to [facility] 9/29 - 10/11 for AECOPD, Bipolar disorder with psychosis. Intubated from 10/13 to 10/17   Assessment / Plan / Recommendation Clinical Impression  Pt presetns with no evidence of aspriation. Pt does have mutliple risk factors including COPD, hx of CVA and intubation over 72 hours. However given her timely strong swallow, normal vocal quality, strong cough, conservative intake and normal alertness feel pt is safe to initate a regular diet with thin liquids. Aspiration precautions provided to pt and RN. No SLP f/u at this time. Please reorder if concerns arise.     Aspiration Risk  Mild    Diet Recommendation Regular;Thin liquid   Liquid Administration via: Cup;Straw Medication Administration: Whole meds with liquid Supervision: Patient able to self feed Compensations: Slow rate;Small sips/bites Postural Changes and/or Swallow Maneuvers: Seated upright 90 degrees    Other  Recommendations Oral Care Recommendations: Oral care BID     Follow Up Recommendations  None    Frequency and Duration        Pertinent Vitals/Pain NA    SLP Swallow Goals     Swallow Study Prior Functional Status       General HPI: 71 y/o F, SNF resident, with PMH of HTN, COPD, DM, Hypothyroidism, cirrhosis, CVA (04) who presented to [facility] ED via EMS after being found unresponsive with ? pulse requiring approximately 5 minutes of CPR by SNF staff, attempts at intubation per EMS unsuccessful, CBG noted to be 27.  Upon arrival was intubated in ER.  Recent admit to [facility] 9/29 - 10/11 for AECOPD, Bipolar disorder with psychosis. Intubated from 10/13 to 10/17 Type of Study: Bedside swallow evaluation Previous Swallow Assessment: MBS in 2005 - results not available.  Diet Prior to this Study: NPO Temperature Spikes Noted: No Respiratory Status: Supplemental O2 delivered via (comment) History of Recent Intubation: Yes Length of Intubations (days): 5 days Date extubated: 10/11/12 Behavior/Cognition: Alert;Cooperative;Pleasant mood Oral Cavity - Dentition: Poor condition Self-Feeding Abilities: Able to feed self Patient Positioning: Upright in bed Baseline Vocal Quality: Clear Volitional Cough: Strong Volitional Swallow: Able to elicit    Oral/Motor/Sensory Function Overall Oral Motor/Sensory Function: Appears within functional limits for tasks assessed   Ice Chips Ice chips: Not tested   Thin Liquid Thin Liquid: Within functional limits Presentation: Cup;Straw;Self Fed    Nectar Thick Nectar Thick Liquid: Not tested   Honey Thick Honey Thick Liquid: Not tested   Puree Puree: Within functional limits Presentation: Self  Fed;Spoon   Solid   GO    Solid: Within functional limits Presentation: Self Fed      Harlon Ditty, MA CCC-SLP [phone]  Adi Doro, Riley Nearing 10/12/2012,8:45 AM

## 2012-10-12 NOTE — Evaluation (Signed)
Physical Therapy Evaluation Patient Details Name: [name] MRN: [id number] DOB: 1941-05-26 Today's Date: 10/12/2012 Time: 4098-1191 PT Time Calculation (min): 23 min  PT Assessment / Plan / Recommendation Clinical Impression  Pt. s/p septic shock with decr mobility secondary to decr endurance for activity and decr balance.  Will benefit from PT to address mobility issues.  Will need NHP with therapy at d/c.      PT Assessment  Patient needs continued PT services    Follow Up Recommendations  Post acute inpatient;Supervision/Assistance - 24 hour    Does the patient have the potential to tolerate intense rehabilitation   No, Recommend SNF  Barriers to Discharge Decreased caregiver support      Equipment Recommendations  None recommended by PT    Recommendations for Other Services     Frequency Min 3X/week    Precautions / Restrictions Precautions Precautions: Fall Restrictions Weight Bearing Restrictions: No   Pertinent Vitals/Pain desat to 85% on 2L, No pain      Mobility  Bed Mobility Bed Mobility: Rolling Left;Left Sidelying to Sit Rolling Left: 4: Min guard;With rail Left Sidelying to Sit: 4: Min guard;With rails;HOB elevated Details for Bed Mobility Assistance: cues for technique and sequence.  Transfers Transfers: Sit to Stand;Stand to [employer] Transfers Sit to Stand: 3: Mod assist;With upper extremity assist;From bed Stand to Sit: 4: Min assist;With upper extremity assist;With armrests;To chair/3-in-1 Stand Pivot Transfers: 4: Min assist Details for Transfer Assistance: Pt. needed cues for hand placement for sit to stand to sit.  Pt. able to take pivotal steps around to chair.  Pt. held onto PT's hands/arms for pivot transfer.   Ambulation/Gait Ambulation/Gait Assistance: Not tested (comment) Stairs: No Wheelchair Mobility Wheelchair Mobility: No         Exercises General Exercises - Lower Extremity Long Arc Quad: AROM;Both;5  reps;Seated Hip Flexion/Marching: AROM;Both;5 reps;Seated   PT Diagnosis: Generalized weakness  PT Problem List: Decreased activity tolerance;Decreased mobility;Decreased cognition;Decreased balance;Decreased knowledge of use of DME;Decreased safety awareness;Decreased knowledge of precautions PT Treatment Interventions: DME instruction;Gait training;Functional mobility training;Therapeutic exercise;Therapeutic activities;Balance training;Cognitive remediation;Patient/family education   PT Goals Acute Rehab PT Goals PT Goal Formulation: With patient Time For Goal Achievement: 10/26/12 Potential to Achieve Goals: Good Pt will go Supine/Side to Sit: with modified independence;with rail PT Goal: Supine/Side to Sit - Progress: Goal set today Pt will go Sit to Stand: with modified independence;with upper extremity assist PT Goal: Sit to Stand - Progress: Goal set today Pt will Transfer Bed to Chair/Chair to Bed: with modified independence PT Transfer Goal: Bed to Chair/Chair to Bed - Progress: Goal set today Pt will Ambulate: 51 - 150 feet;with min assist;with least restrictive assistive device PT Goal: Ambulate - Progress: Goal set today  Visit Information  Last PT Received On: 10/12/12 Assistance Needed: +1    Subjective Data  Subjective: "I was at [facility]."  Pt a questionable historian. Patient Stated Goal: To get better and go back to NH for therapy   Prior Functioning  Home Living Available Help at Discharge: Skilled Nursing Facility Type of Home: Skilled Nursing Facility Prior Function Level of Independence: Needs assistance Needs Assistance: Bathing;Dressing;Feeding;Toileting;Grooming Bath: Moderate Dressing: Moderate Feeding: Moderate Grooming: Moderate Toileting: Moderate    Cognition  Overall Cognitive Status: Impaired Area of Impairment: Attention;Memory;Safety/judgement;Following commands;Awareness of deficits;Problem solving Arousal/Alertness:  Awake/alert Orientation Level: Disoriented to;Place;Time;Situation Behavior During Session: [facility] for tasks performed Current Attention Level: Focused Attention - Other Comments: up to a minute Following Commands: Follows one step  commands inconsistently;Follows one step commands with increased time Safety/Judgement: Decreased awareness of safety precautions;Decreased safety judgement for tasks assessed Cognition - Other Comments: Confused and questionable historian.  Pt. unsure of where she was PTA.      Extremity/Trunk Assessment Right Lower Extremity Assessment RLE ROM/Strength/Tone: WFL for tasks assessed Left Lower Extremity Assessment LLE ROM/Strength/Tone: WFL for tasks assessed Trunk Assessment Trunk Assessment: Normal   Balance Dynamic Sitting Balance Dynamic Sitting - Balance Support: No upper extremity supported;Feet supported Dynamic Sitting - Level of Assistance: 6: Modified independent (Device/Increase time) Dynamic Sitting Balance - Compensations: up to 5 minutes  End of Session PT - End of Session Equipment Utilized During Treatment: Gait belt;Oxygen Activity Tolerance: Patient limited by fatigue Patient left: in chair;with call bell/phone within reach Nurse Communication: Mobility status       INGOLD,Shanayah Kaffenberger 10/12/2012, 9:46 AM  [facility] [phone] (pager)

## 2012-10-12 NOTE — Progress Notes (Addendum)
Name: [name] MRN: [id number] DOB: October 15, 1941    LOS: 5  Referring Provider:  EDP  Reason for Referral:  Status post arrest PCP - Maryelizabeth Rowan   PULMONARY / CRITICAL CARE MEDICINE  HPI:  71 y/o F, SNF resident, with PMH of HTN, COPD, DM, Hypothyroidism, cirrhosis, CVA (04) who presented to [facility] ED via EMS after being found unresponsive with ? pulse requiring approximately 5 minutes of CPR by SNF staff, attempts at intubation per EMS unsuccessful, CBG noted to be 27.  Upon arrival was intubated in ER.  Recent admit to [facility] 9/29 - 10/11 for AECOPD, Bipolar disorder with psychosis.  PCCM called for ICU admit.    Events Since Admission: 10/13 - Admit s/p CPR, CBG 27, intubated in ER 10/13 echo - nml EF, thick atrial septum 10/15 Object removed from ETT. Appeared to be a small oval shaped seed, like the ones found in green beans large hematoma noted on the pt's rt. Lateral thigh   Current Status:  Excellent UO with lasix Afebrile oob to chair   Vital Signs: Temp:  [97.5 F (36.4 C)-98 F (36.7 C)] 97.9 F (36.6 C) (10/18 0800) Pulse Rate:  [57-100] 100  (10/18 0934) Resp:  [14-26] 26  (10/18 0900) BP: (89-142)/(46-76) 142/63 mmHg (10/18 0934) SpO2:  [85 %-100 %] 85 % (10/18 0934)  Physical Examination: General:  Elderly female in NAD Neuro:  Non focal, interactive HEENT:  Mm pink/moist Cardiovascular:  s1s2 rrr, distant tones Lungs:  Minimal inspiratory movement, coarse Abdomen:  Round/soft, bsx4 active Musculoskeletal:  No acute deformities, large hematoma noted on the pt's rt. Lateral thigh Skin:  Cool / dry, 1-2+generalized edema, erythema over puncture sites rt forearm  Active Problems:  Septic shock(785.52)   ASSESSMENT AND PLAN  PULMONARY  Lab 10/08/12 0500 10/07/12 0827  PHART 7.419 7.357  PCO2ART 49.5* 74.1*  PO2ART 115.0* 494.0*  HCO3 31.4* 41.5*  O2SAT 98.0 100.0   Ventilator Settings:   CXR:  10/13>>>small  effusions & basilar ASD.  Endotracheal tube in good position ETT:  10/13>>>10/17  A:   Acute Respiratory Failure Hx of COPD - recent exacerbation with admit Hypercarbia   P:   -No obvious bspasm -PO prednisone 40 mg taper over 2 weeks -BD's  CARDIOVASCULAR  Lab 10/07/12 1945 10/07/12 1250 10/07/12 0920 10/07/12 0721  TROPONINI <0.30 <0.30 -- <0.30  LATICACIDVEN -- -- 1.1 --  PROBNP -- -- -- 465.5*   ECG:  Wnl, no st changes Lines: 10/13 R Fem TLC>>>10/14 LIJ 10/14 >>  A:  S/p cardiac arrest  Hypotension Hx HTN Hypothermia -resolved EKG -nSR  P:  Off pressors -asa -dc CVL in 24 h    RENAL  Lab 10/12/12 0450 10/11/12 1703 10/11/12 0427 10/10/12 0126 10/09/12 0400 10/08/12 0500  NA 145 147* 143 138 142 --  K 3.6 2.9* -- -- -- --  CL 106 104 103 102 103 --  CO2 35* 37* 32 32 33* --  BUN 25* 26* 27* 26* 16 --  CREATININE 0.46* 0.41* 0.45* 0.46* 0.44* --  CALCIUM 8.0* 8.3* 8.1* 7.7* 7.4* --  MG -- -- -- -- 1.8 1.1*  PHOS -- -- -- -- -- --   Intake/Output      10/17 0701 - 10/18 0700 10/18 0701 - 10/19 0700   I.V. (mL/kg) 535 (7.3) 20 (0.3)   NG/GT 70    IV Piggyback 754    Total Intake(mL/kg) 1359 (18.5) 20 (0.3)   Urine (mL/kg/hr)  3575 (2) 125   Total Output 3575 125   Net -2216 -105        Stool Occurrence 1 x     Foley:  10/13>>>  A:  Hypomagnesemia / hypokalemia/ hypocalcemia   P:   -repleted -lasix 20 PO daily until edema clears  GASTROINTESTINAL  Lab 10/07/12 0721  AST 27  ALT 27  ALKPHOS 50  BILITOT 0.5  PROT 5.2*  ALBUMIN 2.4*    A:  Protein calorie malnutrition   P:   -advance diet -dc PPI  HEMATOLOGIC  Lab 10/12/12 0450 10/11/12 0427 10/10/12 0126 10/09/12 0400 10/08/12 0500  HGB 9.2* 9.3* 8.9* 7.6* 8.7*  HCT 28.8* 28.1* 27.1* 23.4* 26.5*  PLT 137* 152 147* 190 237  INR -- -- -- -- --  APTT -- -- -- -- --   A:  Rt gluteal hematoma -stable  P: improved post Transfusion dc lovenox -scds  INFECTIOUS  Lab  10/12/12 0450 10/11/12 0427 10/10/12 0126 10/09/12 0400 10/08/12 0500 10/07/12 0845  WBC 9.5 11.1* 14.1* 16.2* 23.4* --  PROCALCITON -- -- 0.65 1.38 -- <0.10   Cultures: 10/13 BCx2>>>coag neg staph 1/2 10/13 UA>>>7-10 WCs 10/13 UC>>>ng 10/13 Sputum>>> diphtheroids Urine strep ag neg  Antibiotics: Vanco (empiric, SNF) 10/13 >>> Zosyn (empiric, SNF) 10/13>>> 10/16  A:   Rule out sepsis -low pct reassuring  P:   -ct vanc until 10/19 dc zosyn   ENDOCRINE  Lab 10/12/12 0838 10/12/12 0451 10/12/12 0020 10/11/12 1936 10/11/12 1700  GLUCAP 103* 114* 104* 91 136*   A:  Diabetes - on levemir 20 QD with SSI Hypothyroidism - on 175 mcg QD maintenance, TSH ok P:   -ct lantus -expect sugars to improve now that steroids lowered -ct PO synthroid -dc Stress dose steroids - taper to home dose pred eventually  NEUROLOGIC  A:   Bipolar Disorder - on zyprexa, valproate worked better per POA Hx of CVA - unclear residual effect Hx of ETOH abuse  P:   -resumed zyprexa -haldol prn only     Code Status:  Full Code.  POA indicates she would be ok for short term support but not long term artifical support.  Onalee Hua updated 10/17 DVT Px:  lovenox Social / Family:  Updated HCPOA - david 10/17 PT ongoing- will need SNF  Will ask [facility] to take over 10/19, PCCM will follow for COPD    Cyril Mourning MD. FCCP. [place] Pager [phone] If no response call [phone]   10/12/2012, 10:44 AM

## 2012-10-12 NOTE — Progress Notes (Signed)
Nutrition Follow-up  Intervention:   1. Recommend diet change to Carb Mod Medium given hx of DM 2. RD will continue to follow    Assessment:   Pt was extubated on 10/17. Bedside swallow evaluation completed this morning, no s/s of aspiration and was advanced to regular diet/thin liquids per SLP.   TF off with extubation.  RN reports pt did very well with applesauce this morning.   Diet Order:  Regular PO intake: no meals yet   Meds: Scheduled Meds:   . ipratropium  0.5 mg Nebulization Q4H   And  . albuterol  2.5 mg Nebulization Q4H  . antiseptic oral rinse  15 mL Mouth Rinse QID  . aspirin  81 mg Oral Daily  . chlorhexidine  15 mL Mouth Rinse BID  . furosemide  40 mg Intravenous Once  . hydrocortisone sod succinate (SOLU-CORTEF) injection  50 mg Intravenous Q8H  . insulin aspart  0-15 Units Subcutaneous 6 X Daily  . insulin aspart  1 Units Subcutaneous TID WC  . insulin glargine  10 Units Subcutaneous Q24H  . levothyroxine  175 mcg Oral Daily  . lipase/protease/amylase  1 capsule Oral TID AC  . OLANZapine  10 mg Oral QHS  . pantoprazole  40 mg Oral Daily  . potassium chloride  10 mEq Intravenous Q1 Hr x 3  . potassium chloride  10 mEq Intravenous Q1 Hr x 6  . potassium chloride  40 mEq Per Tube Once  . potassium chloride SA      . vancomycin  1,000 mg Intravenous Q12H  . DISCONTD: albuterol-ipratropium  6 puff Inhalation Q4H  . DISCONTD: feeding supplement (JEVITY 1.2 CAL)  1,000 mL Per Tube Q24H  . DISCONTD: feeding supplement  30 mL Per Tube QID  . DISCONTD: hydrocortisone sod succinate (SOLU-CORTEF) injection  100 mg Intravenous Q8H  . DISCONTD: pantoprazole sodium  40 mg Per Tube Q1200  . DISCONTD: potassium chloride  10 mEq Intravenous Q1 Hr x 6   Continuous Infusions:   . dextrose    . DISCONTD: fentaNYL infusion INTRAVENOUS Stopped (10/11/12 0900)  . DISCONTD: midazolam (VERSED) infusion Stopped (10/10/12 1000)  . DISCONTD: norepinephrine (LEVOPHED) Adult  infusion Stopped (10/10/12 1000)  . DISCONTD: vasopressin (PITRESSIN) infusion - *FOR SHOCK* Stopped (10/10/12 1415)   PRN Meds:.sodium chloride, albuterol, dextrose, fentaNYL, haloperidol lactate, DISCONTD: midazolam  Labs:  CMP     Component Value Date/Time   NA 145 10/12/2012 0450   K 3.6 10/12/2012 0450   CL 106 10/12/2012 0450   CO2 35* 10/12/2012 0450   GLUCOSE 108* 10/12/2012 0450   BUN 25* 10/12/2012 0450   CREATININE 0.46* 10/12/2012 0450   CALCIUM 8.0* 10/12/2012 0450   PROT 5.2* 10/07/2012 0721   ALBUMIN 2.4* 10/07/2012 0721   AST 27 10/07/2012 0721   ALT 27 10/07/2012 0721   ALKPHOS 50 10/07/2012 0721   BILITOT 0.5 10/07/2012 0721   GFRNONAA >90 10/12/2012 0450   GFRAA >90 10/12/2012 0450     Intake/Output Summary (Last 24 hours) at 10/12/12 0903 Last data filed at 10/12/12 0800  Gross per 24 hour  Intake   1249 ml  Output   3650 ml  Net  -2401 ml    Weight Status:  162 lbs, up from admission weight despite negative fluid balance  Re-estimated needs:  1550-1750 kcal,  75-90 gm protein   Nutrition Dx:  Inadequate oral intake r/t inability to eat AEB mechanical ventilation. Now resolving with extubation and diet advance.  Goal:  EN goal no longer applicable  New Goal: PO intake to meet >90% estimated nutrition needs   Monitor:  PO intake, weight, labs   Clarene Duke RD, LDN Pager [phone] After Hours pager [phone]

## 2012-10-13 DIAGNOSIS — E119 Type 2 diabetes mellitus without complications: Secondary | ICD-10-CM

## 2012-10-13 LAB — CULTURE, BLOOD (ROUTINE X 2): Culture: NO GROWTH

## 2012-10-13 LAB — GLUCOSE, CAPILLARY
Glucose-Capillary: 111 mg/dL — ABNORMAL HIGH (ref 70–99)
Glucose-Capillary: 158 mg/dL — ABNORMAL HIGH (ref 70–99)
Glucose-Capillary: 192 mg/dL — ABNORMAL HIGH (ref 70–99)

## 2012-10-13 LAB — CBC
HCT: 33 % — ABNORMAL LOW (ref 36.0–46.0)
MCH: 29.1 pg (ref 26.0–34.0)
MCV: 94.3 fL (ref 78.0–100.0)
Platelets: 139 10*3/uL — ABNORMAL LOW (ref 150–400)
RDW: 15.7 % — ABNORMAL HIGH (ref 11.5–15.5)

## 2012-10-13 LAB — BASIC METABOLIC PANEL
BUN: 20 mg/dL (ref 6–23)
CO2: 37 mEq/L — ABNORMAL HIGH (ref 19–32)
Calcium: 8.3 mg/dL — ABNORMAL LOW (ref 8.4–10.5)
Chloride: 105 mEq/L (ref 96–112)
Creatinine, Ser: 0.5 mg/dL (ref 0.50–1.10)
Glucose, Bld: 96 mg/dL (ref 70–99)

## 2012-10-13 MED ORDER — LEVOFLOXACIN IN D5W 750 MG/150ML IV SOLN
750.0000 mg | INTRAVENOUS | Status: DC
  Administered 2012-10-13 – 2012-10-14 (×2): 750 mg via INTRAVENOUS
  Filled 2012-10-13 (×3): qty 150

## 2012-10-13 MED ORDER — FUROSEMIDE 10 MG/ML IJ SOLN
INTRAMUSCULAR | Status: AC
  Administered 2012-10-13: 20 mg via INTRAVENOUS
  Filled 2012-10-13: qty 4

## 2012-10-13 MED ORDER — FUROSEMIDE 20 MG PO TABS
20.0000 mg | ORAL_TABLET | Freq: Every day | ORAL | Status: DC
  Administered 2012-10-13 – 2012-10-15 (×3): 20 mg via ORAL
  Filled 2012-10-13 (×3): qty 1

## 2012-10-13 MED ORDER — SODIUM CHLORIDE 0.9 % IJ SOLN
10.0000 mL | INTRAMUSCULAR | Status: DC | PRN
  Administered 2012-10-13: 10 mL
  Administered 2012-10-14: 20 mL
  Administered 2012-10-15: 10 mL

## 2012-10-13 MED ORDER — FUROSEMIDE 10 MG/ML IJ SOLN
20.0000 mg | Freq: Once | INTRAMUSCULAR | Status: AC
  Administered 2012-10-13: 20 mg via INTRAVENOUS

## 2012-10-13 NOTE — Progress Notes (Signed)
[facility] PROGRESS NOTE  [name] UJW:[id number] DOB: 08-Dec-1941 DOA: 10/07/2012 PCP: Maryelizabeth Rowan, MD  Assessment/Plan: 1. Acute respiratory failure - Pt at this point is extubated and saturating well on supplemental oxygen via [place]. Last oxygen saturation 96% with respirations at 19 - Did have diffuse rhales this AM and as such ordered 1 time dose of lasix 20 mg IV. - Early chest xrays interpreted as bibasilar patchy airspace disease, suspect flash pulmonary edema vs infectious etiology as cause - Monitor I/O's closely  2. Septic Shock - Resolving currently patient is on IV antibiotics Vancomycin.  Will transition to IV levaquin 10/13/12  - Vitals currently within normal limits and blood pressure off of pressors are 129/79 - continue to monitor closely   3. COPD - most likely contributed to # 1 - Continue albuterol, prednisone, budesonide, IV antibiotics.  Will change IV vancomycin to Levaquin 10/19 - supplemental oxygen at this juncture - sputum culture - urine legionella/strep antigen   4. DM - Diabetic diet - Will plan on continuing lantus and SSI - continue to monitor blood sugar levels and adjust medication pending results.  5. Hypothyroidism - Continue current regimen levothyroxine  6. Bipolar disorder - stable patient is currently on olanzapine   Code Status: full Family Communication: No family at bedside Disposition Plan: Pending continued clinical improvement likely d/c in 2-3 days.   Consultants:  Pulmonary  Procedures:  Intubation/central line 10/08/12  Antibiotics:  Vancomycin  Levaquin started 10/13/12  HPI/Subjective: Patient mentions that she feels better today. Nursing reported that patient had diffuse rhales this AM.  No new complaints.    Objective: Filed Vitals:   10/12/12 2033 10/12/12 2045 10/13/12 0454 10/13/12 0800  BP:  121/69 129/79   Pulse:  95 98   Temp:  98.1 F (36.7 C) 97.4 F (36.3 C)   TempSrc:   Oral Oral   Resp:  20 19   Height:      Weight:      SpO2: 97% 97% 96% 96%    Intake/Output Summary (Last 24 hours) at 10/13/12 1144 Last data filed at 10/13/12 1100  Gross per 24 hour  Intake    240 ml  Output   3015 ml  Net  -2775 ml   Filed Weights   10/10/12 0400 10/11/12 0327 10/11/12 0500  Weight: 71 kg (156 lb 8.4 oz) 73.5 kg (162 lb 0.6 oz) 73.5 kg (162 lb 0.6 oz)    Exam:   General:  Pt in NAD, Alert and Awake  Cardiovascular: RRR, No MRG  Respiratory: CTA BL, no wheezes  Abdomen: Soft, NT, ND  Data Reviewed: Basic Metabolic Panel:  Lab 10/13/12 5621 10/12/12 0450 10/11/12 1703 10/11/12 0427 10/10/12 0126 10/09/12 0400 10/08/12 0500  NA 146* 145 147* 143 138 -- --  K 3.5 3.6 2.9* 2.8* 3.5 -- --  CL 105 106 104 103 102 -- --  CO2 37* 35* 37* 32 32 -- --  GLUCOSE 96 108* 139* 167* 248* -- --  BUN 20 25* 26* 27* 26* -- --  CREATININE 0.50 0.46* 0.41* 0.45* 0.46* -- --  CALCIUM 8.3* 8.0* 8.3* 8.1* 7.7* -- --  MG -- -- -- -- -- 1.8 1.1*  PHOS -- -- -- -- -- -- --   Liver Function Tests:  Lab 10/07/12 0721  AST 27  ALT 27  ALKPHOS 50  BILITOT 0.5  PROT 5.2*  ALBUMIN 2.4*   No results found for this basename: LIPASE:5,AMYLASE:5 in the last 168  hours No results found for this basename: AMMONIA:5 in the last 168 hours CBC:  Lab 10/13/12 0440 10/12/12 0450 10/11/12 0427 10/10/12 0126 10/09/12 0400 10/07/12 0721  WBC 11.1* 9.5 11.1* 14.1* 16.2* --  NEUTROABS -- -- -- -- -- 20.8*  HGB 10.2* 9.2* 9.3* 8.9* 7.6* --  HCT 33.0* 28.8* 28.1* 27.1* 23.4* --  MCV 94.3 92.3 91.8 89.7 91.1 --  PLT 139* 137* 152 147* 190 --   Cardiac Enzymes:  Lab 10/07/12 1945 10/07/12 1250 10/07/12 0721  CKTOTAL -- -- --  CKMB -- -- --  CKMBINDEX -- -- --  TROPONINI <0.30 <0.30 <0.30   BNP (last 3 results)  Basename 10/07/12 0721  PROBNP 465.5*   CBG:  Lab 10/13/12 1134 10/13/12 0842 10/13/12 0450 10/13/12 0416 10/13/12 0017  GLUCAP 192* 111* 139* 65* 158*     Recent Results (from the past 240 hour(s))  CULTURE, BLOOD (ROUTINE X 2)     Status: Normal   Collection Time   10/07/12  8:37 AM      Component Value Range Status Comment   Specimen Description BLOOD CENTRAL LINE   Final    Special Requests BOTTLES DRAWN AEROBIC AND ANAEROBIC 10CC   Final    Culture  Setup Time 10/07/2012 14:18   Final    Culture     Final    Value: STAPHYLOCOCCUS SPECIES (COAGULASE NEGATIVE)     Note: THE SIGNIFICANCE OF ISOLATING THIS ORGANISM FROM A SINGLE SET OF BLOOD CULTURES WHEN MULTIPLE SETS ARE DRAWN IS UNCERTAIN. PLEASE NOTIFY THE MICROBIOLOGY DEPARTMENT WITHIN ONE WEEK IF SPECIATION AND SENSITIVITIES ARE REQUIRED.     Note: Gram Stain Report Called to,Read Back By and Verified With: SARA HERBERT 1203 10/08/12 BY KRAWS   Report Status 10/09/2012 FINAL   Final   CULTURE, BLOOD (ROUTINE X 2)     Status: Normal (Preliminary result)   Collection Time   10/07/12 11:47 AM      Component Value Range Status Comment   Specimen Description BLOOD LEFT HAND   Final    Special Requests BOTTLES DRAWN AEROBIC ONLY 2.5CC   Final    Culture  Setup Time 10/07/2012 21:35   Final    Culture     Final    Value:        BLOOD CULTURE RECEIVED NO GROWTH TO DATE CULTURE WILL BE HELD FOR 5 DAYS BEFORE ISSUING A FINAL NEGATIVE REPORT   Report Status PENDING   Incomplete   MRSA PCR SCREENING     Status: Normal   Collection Time   10/07/12 12:46 PM      Component Value Range Status Comment   MRSA by PCR NEGATIVE  NEGATIVE Final   URINE CULTURE     Status: Normal   Collection Time   10/07/12  1:00 PM      Component Value Range Status Comment   Specimen Description URINE, CATHETERIZED   Final    Special Requests NONE   Final    Culture  Setup Time 10/07/2012 21:41   Final    Colony Count NO GROWTH   Final    Culture NO GROWTH   Final    Report Status 10/08/2012 FINAL   Final   CULTURE, RESPIRATORY     Status: Normal   Collection Time   10/07/12  4:07 PM      Component  Value Range Status Comment   Specimen Description TRACHEAL ASPIRATE   Final    Special Requests NONE  Final    Gram Stain     Final    Value: ABUNDANT WBC PRESENT, PREDOMINANTLY PMN     NO SQUAMOUS EPITHELIAL CELLS SEEN     FEW GRAM POSITIVE RODS   Culture     Final    Value: ABUNDANT DIPHTHEROIDS(CORYNEBACTERIUM SPECIES)     Note: Standardized susceptibility testing for this organism is not available.   Report Status 10/10/2012 FINAL   Final      Studies: No results found.  Scheduled Meds:   . albuterol  2.5 mg Nebulization Q4H  . antiseptic oral rinse  15 mL Mouth Rinse QID  . aspirin  81 mg Oral Daily  . budesonide  0.25 mg Nebulization BID  . chlorhexidine  15 mL Mouth Rinse BID  . furosemide  20 mg Intravenous Once  . furosemide  20 mg Oral Daily  . insulin aspart  0-15 Units Subcutaneous 6 X Daily  . insulin aspart  1 Units Subcutaneous TID WC  . insulin glargine  5 Units Subcutaneous Q24H  . levothyroxine  175 mcg Oral Daily  . lipase/protease/amylase  1 capsule Oral TID AC  . magnesium oxide  200 mg Oral BID  . OLANZapine  10 mg Oral QHS  . predniSONE  40 mg Oral Q breakfast  . tiotropium  18 mcg Inhalation Daily  . vancomycin  1,000 mg Intravenous Q12H  . DISCONTD: furosemide  20 mg Oral Daily   Continuous Infusions:   . dextrose      Active Problems:  Septic shock(785.52)    Time spent: > 35 minutes    Penny Pia  [facility] Pager [phone]. If 8PM-8AM, please contact night-coverage at [email or web address], password [facility] 10/13/2012, 11:44 AM  LOS: 6 days

## 2012-10-13 NOTE — Progress Notes (Signed)
Suctioned patient nasotracheally for sputum sample. Sent sample to lab.

## 2012-10-13 NOTE — Progress Notes (Signed)
Hypoglycemic Event  CBG: 65  Treatment: 15 GM carbohydrate snack  Symptoms: None  Follow-up CBG: Time:0445 CBG Result:139  Possible Reasons for Event: Unknown  Comments/MD notified:blood sugar came up to 139.    [name]  Remember to initiate Hypoglycemia Order Set & complete

## 2012-10-13 NOTE — Progress Notes (Signed)
ANTIBIOTIC CONSULT NOTE - INITIAL  Pharmacy Consult for Levaquin Indication: pneumonia, COPD exacerbation  No Known Allergies  Patient Measurements: Height: 5' 4.96" (165 cm) Weight: 162 lb 0.6 oz (73.5 kg) IBW/kg (Calculated) : 56.91   Vital Signs: Temp: 97.4 F (36.3 C) (10/19 0454) Temp src: Oral (10/19 0454) BP: 129/79 mmHg (10/19 0454) Pulse Rate: 98  (10/19 0454) Intake/Output from previous day: 10/18 0701 - 10/19 0700 In: 520 [P.O.:240; I.V.:80; IV Piggyback:200] Out: 1140 [Urine:1140] Intake/Output from this shift: Total I/O In: -  Out: 2000 [Urine:2000]  Labs:  [facility] 10/13/12 0440 10/12/12 0450 10/11/12 1703 10/11/12 0427  WBC 11.1* 9.5 -- 11.1*  HGB 10.2* 9.2* -- 9.3*  PLT 139* 137* -- 152  LABCREA -- -- -- --  CREATININE 0.50 0.46* 0.41* --   Estimated Creatinine Clearance: 64.7 ml/min (by C-G formula based on Cr of 0.5).  Basename 10/10/12 2220  VANCOTROUGH 9.2*  VANCOPEAK --  Drue Dun --  GENTTROUGH --  GENTPEAK --  GENTRANDOM --  TOBRATROUGH --  TOBRAPEAK --  TOBRARND --  AMIKACINPEAK --  AMIKACINTROU --  AMIKACIN --     Microbiology: Recent Results (from the past 720 hour(s))  CULTURE, BLOOD (ROUTINE X 2)     Status: Normal   Collection Time   10/07/12  8:37 AM      Component Value Range Status Comment   Specimen Description BLOOD CENTRAL LINE   Final    Special Requests BOTTLES DRAWN AEROBIC AND ANAEROBIC 10CC   Final    Culture  Setup Time 10/07/2012 14:18   Final    Culture     Final    Value: STAPHYLOCOCCUS SPECIES (COAGULASE NEGATIVE)     Note: THE SIGNIFICANCE OF ISOLATING THIS ORGANISM FROM A SINGLE SET OF BLOOD CULTURES WHEN MULTIPLE SETS ARE DRAWN IS UNCERTAIN. PLEASE NOTIFY THE MICROBIOLOGY DEPARTMENT WITHIN ONE WEEK IF SPECIATION AND SENSITIVITIES ARE REQUIRED.     Note: Gram Stain Report Called to,Read Back By and Verified With: SARA HERBERT 1203 10/08/12 BY KRAWS   Report Status 10/09/2012 FINAL   Final   CULTURE,  BLOOD (ROUTINE X 2)     Status: Normal (Preliminary result)   Collection Time   10/07/12 11:47 AM      Component Value Range Status Comment   Specimen Description BLOOD LEFT HAND   Final    Special Requests BOTTLES DRAWN AEROBIC ONLY 2.5CC   Final    Culture  Setup Time 10/07/2012 21:35   Final    Culture     Final    Value:        BLOOD CULTURE RECEIVED NO GROWTH TO DATE CULTURE WILL BE HELD FOR 5 DAYS BEFORE ISSUING A FINAL NEGATIVE REPORT   Report Status PENDING   Incomplete   MRSA PCR SCREENING     Status: Normal   Collection Time   10/07/12 12:46 PM      Component Value Range Status Comment   MRSA by PCR NEGATIVE  NEGATIVE Final   URINE CULTURE     Status: Normal   Collection Time   10/07/12  1:00 PM      Component Value Range Status Comment   Specimen Description URINE, CATHETERIZED   Final    Special Requests NONE   Final    Culture  Setup Time 10/07/2012 21:41   Final    Colony Count NO GROWTH   Final    Culture NO GROWTH   Final    Report Status 10/08/2012 FINAL  Final   CULTURE, RESPIRATORY     Status: Normal   Collection Time   10/07/12  4:07 PM      Component Value Range Status Comment   Specimen Description TRACHEAL ASPIRATE   Final    Special Requests NONE   Final    Gram Stain     Final    Value: ABUNDANT WBC PRESENT, PREDOMINANTLY PMN     NO SQUAMOUS EPITHELIAL CELLS SEEN     FEW GRAM POSITIVE RODS   Culture     Final    Value: ABUNDANT DIPHTHEROIDS(CORYNEBACTERIUM SPECIES)     Note: Standardized susceptibility testing for this organism is not available.   Report Status 10/10/2012 FINAL   Final     Medications:  Scheduled:    . albuterol  2.5 mg Nebulization Q4H  . antiseptic oral rinse  15 mL Mouth Rinse QID  . aspirin  81 mg Oral Daily  . budesonide  0.25 mg Nebulization BID  . chlorhexidine  15 mL Mouth Rinse BID  . furosemide  20 mg Intravenous Once  . furosemide  20 mg Oral Daily  . insulin aspart  0-15 Units Subcutaneous 6 X Daily  . insulin  aspart  1 Units Subcutaneous TID WC  . insulin glargine  5 Units Subcutaneous Q24H  . levofloxacin (LEVAQUIN) IV  750 mg Intravenous Q24H  . levothyroxine  175 mcg Oral Daily  . lipase/protease/amylase  1 capsule Oral TID AC  . magnesium oxide  200 mg Oral BID  . OLANZapine  10 mg Oral QHS  . predniSONE  40 mg Oral Q breakfast  . tiotropium  18 mcg Inhalation Daily  . DISCONTD: furosemide  20 mg Oral Daily  . DISCONTD: vancomycin  1,000 mg Intravenous Q12H   Assessment: 71 yo female to begin Levaquin for pneumonia and COPD exacerbation. WBC still slightly elevated but improved. Patient is afebrile. Renal function is stable.  Goal of Therapy:  resolution of pneumonia  Plan:  -Levaquin 750 mg IV q24h -Will f/u renal function and clinical progress  [facility], [address].D., BCPS Clinical Pharmacist Pager: [phone] 10/13/2012 12:49 PM

## 2012-10-14 DIAGNOSIS — E162 Hypoglycemia, unspecified: Secondary | ICD-10-CM

## 2012-10-14 LAB — GLUCOSE, CAPILLARY
Glucose-Capillary: 36 mg/dL — CL (ref 70–99)
Glucose-Capillary: 232 mg/dL — ABNORMAL HIGH (ref 70–99)

## 2012-10-14 LAB — LEGIONELLA ANTIGEN, URINE

## 2012-10-14 MED ORDER — INSULIN GLARGINE 100 UNIT/ML ~~LOC~~ SOLN
3.0000 [IU] | SUBCUTANEOUS | Status: DC
  Administered 2012-10-15: 3 [IU] via SUBCUTANEOUS

## 2012-10-14 MED ORDER — INSULIN ASPART 100 UNIT/ML ~~LOC~~ SOLN
0.0000 [IU] | Freq: Three times a day (TID) | SUBCUTANEOUS | Status: DC
  Administered 2012-10-14: 2 [IU] via SUBCUTANEOUS
  Administered 2012-10-15: 1 [IU] via SUBCUTANEOUS
  Administered 2012-10-15: 2 [IU] via SUBCUTANEOUS

## 2012-10-14 MED ORDER — DEXTROSE 50 % IV SOLN
INTRAVENOUS | Status: AC
  Administered 2012-10-14: 50 mL
  Filled 2012-10-14: qty 50

## 2012-10-14 MED ORDER — ALBUTEROL SULFATE (5 MG/ML) 0.5% IN NEBU
2.5000 mg | INHALATION_SOLUTION | Freq: Four times a day (QID) | RESPIRATORY_TRACT | Status: DC
  Administered 2012-10-15 (×3): 2.5 mg via RESPIRATORY_TRACT
  Filled 2012-10-14 (×3): qty 0.5

## 2012-10-14 MED ORDER — DEXTROSE 50 % IV SOLN: 50.0000 mL | Freq: Once | INTRAVENOUS | Status: AC | PRN

## 2012-10-14 MED ORDER — GLUCOSE 40 % PO GEL
ORAL | Status: AC
  Filled 2012-10-14: qty 1

## 2012-10-14 MED ORDER — INSULIN GLARGINE 100 UNIT/ML ~~LOC~~ SOLN: 4.0000 [IU] | SUBCUTANEOUS | Status: DC

## 2012-10-14 NOTE — Progress Notes (Signed)
Hypoglycemic Event  CBG: 36  Treatment: D50 IV   Symptoms: Sweating and Lethargic  Follow-up CBG: [id number] CBG Result:174  Possible Reasons for Event: poor po intake Comments/MD notified:Patient more alert and given orange juice    Theresia Lo, Wayne Both  Remember to initiate Hypoglycemia Order Set & complete

## 2012-10-14 NOTE — Progress Notes (Signed)
[facility] PROGRESS NOTE  [name] ZOX:[id number] DOB: [birth year] DOA: 10/07/2012 PCP: Maryelizabeth Rowan, MD  Assessment/Plan: 1. Acute respiratory failure - Pt at this point is extubated and saturating well on supplemental oxygen via [place]. Last oxygen saturation 99% with respirations at 18 - great response to lasix 20 mg IV yesterday with net output of 4,800 ml - Early chest xrays interpreted as bibasilar patchy airspace disease, suspect flash pulmonary edema vs infectious etiology as cause - Monitor I/O's closely - Continue current regimen as patient is currently improved.  2. Septic Shock - Resolving currently patient is on IV antibiotics Vancomycin.  Will transition to IV levaquin 10/13/12  - Vitals currently within normal limits and blood pressure off of pressors ranging from 152/89- 94/52 - continue to monitor closely  3. COPD - most likely contributed to # 1 - Continue albuterol, prednisone, budesonide, IV antibiotics.  Will change IV vancomycin to Levaquin 10/19 - supplemental oxygen at this juncture - sputum culture pending - urine legionella antigen pending - Strep pneumo urine antigen negative   4. DM - Diabetic diet - Will plan on decreasing Lantus to 4 Units SQ daily and place on sensitive sliding scale given reported hypoglycemic episode.  5. Hypothyroidism - Continue current regimen levothyroxine  6. Bipolar disorder - stable patient is currently on olanzapine   Code Status: full Family Communication: No family at bedside Disposition Plan: Pending continued clinical improvement likely d/c in 2-3 days.   Consultants:  Pulmonary  Procedures:  Intubation/central line 10/08/12  Antibiotics:  Vancomycin  Levaquin started 10/13/12  HPI/Subjective: Patient mentions that she feels better today. Reportedly had hypoglycemic episode with blood sugars in 30's today.  Currently patient is alert with no complaints.  Objective: Filed Vitals:     10/14/12 0408 10/14/12 0512 10/14/12 0840 10/14/12 1114  BP: 94/52     Pulse: 72     Temp:      TempSrc:      Resp: 18     Height:      Weight:      SpO2: 100% 99% 98% 99%    Intake/Output Summary (Last 24 hours) at 10/14/12 1231 Last data filed at 10/14/12 0700  Gross per 24 hour  Intake      0 ml  Output   2800 ml  Net  -2800 ml   Filed Weights   10/10/12 0400 10/11/12 0327 10/11/12 0500  Weight: 71 kg (156 lb 8.4 oz) 73.5 kg (162 lb 0.6 oz) 73.5 kg (162 lb 0.6 oz)    Exam:   General:  Pt in NAD, Alert and Awake  Cardiovascular: RRR, No MRG  Respiratory: CTA BL, no wheezes  Abdomen: Soft, NT, ND  Data Reviewed: Basic Metabolic Panel:  Lab 10/13/12 8119 10/12/12 0450 10/11/12 1703 10/11/12 0427 10/10/12 0126 10/09/12 0400 10/08/12 0500  NA 146* 145 147* 143 138 -- --  K 3.5 3.6 2.9* 2.8* 3.5 -- --  CL 105 106 104 103 102 -- --  CO2 37* 35* 37* 32 32 -- --  GLUCOSE 96 108* 139* 167* 248* -- --  BUN 20 25* 26* 27* 26* -- --  CREATININE 0.50 0.46* 0.41* 0.45* 0.46* -- --  CALCIUM 8.3* 8.0* 8.3* 8.1* 7.7* -- --  MG -- -- -- -- -- 1.8 1.1*  PHOS -- -- -- -- -- -- --   Liver Function Tests: No results found for this basename: AST:5,ALT:5,ALKPHOS:5,BILITOT:5,PROT:5,ALBUMIN:5 in the last 168 hours No results found for this basename: LIPASE:5,AMYLASE:5 in  the last 168 hours No results found for this basename: AMMONIA:5 in the last 168 hours CBC:  Lab 10/13/12 0440 10/12/12 0450 10/11/12 0427 10/10/12 0126 10/09/12 0400  WBC 11.1* 9.5 11.1* 14.1* 16.2*  NEUTROABS -- -- -- -- --  HGB 10.2* 9.2* 9.3* 8.9* 7.6*  HCT 33.0* 28.8* 28.1* 27.1* 23.4*  MCV 94.3 92.3 91.8 89.7 91.1  PLT 139* 137* 152 147* 190   Cardiac Enzymes:  Lab 10/07/12 1945 10/07/12 1250  CKTOTAL -- --  CKMB -- --  CKMBINDEX -- --  TROPONINI <0.30 <0.30   BNP (last 3 results)  Basename 10/07/12 0721  PROBNP 465.5*   CBG:  Lab 10/14/12 1103 10/14/12 0927 10/14/12 0434 10/14/12 0403  10/13/12 2025  GLUCAP 162* 180* 174* 36* 148*    Recent Results (from the past 240 hour(s))  CULTURE, BLOOD (ROUTINE X 2)     Status: Normal   Collection Time   10/07/12  8:37 AM      Component Value Range Status Comment   Specimen Description BLOOD CENTRAL LINE   Final    Special Requests BOTTLES DRAWN AEROBIC AND ANAEROBIC 10CC   Final    Culture  Setup Time 10/07/2012 14:18   Final    Culture     Final    Value: STAPHYLOCOCCUS SPECIES (COAGULASE NEGATIVE)     Note: THE SIGNIFICANCE OF ISOLATING THIS ORGANISM FROM A SINGLE SET OF BLOOD CULTURES WHEN MULTIPLE SETS ARE DRAWN IS UNCERTAIN. PLEASE NOTIFY THE MICROBIOLOGY DEPARTMENT WITHIN ONE WEEK IF SPECIATION AND SENSITIVITIES ARE REQUIRED.     Note: Gram Stain Report Called to,Read Back By and Verified With: SARA HERBERT 1203 10/08/12 BY KRAWS   Report Status 10/09/2012 FINAL   Final   CULTURE, BLOOD (ROUTINE X 2)     Status: Normal   Collection Time   10/07/12 11:47 AM      Component Value Range Status Comment   Specimen Description BLOOD LEFT HAND   Final    Special Requests BOTTLES DRAWN AEROBIC ONLY 2.5CC   Final    Culture  Setup Time 10/07/2012 21:35   Final    Culture NO GROWTH 5 DAYS   Final    Report Status 10/13/2012 FINAL   Final   MRSA PCR SCREENING     Status: Normal   Collection Time   10/07/12 12:46 PM      Component Value Range Status Comment   MRSA by PCR NEGATIVE  NEGATIVE Final   URINE CULTURE     Status: Normal   Collection Time   10/07/12  1:00 PM      Component Value Range Status Comment   Specimen Description URINE, CATHETERIZED   Final    Special Requests NONE   Final    Culture  Setup Time 10/07/2012 21:41   Final    Colony Count NO GROWTH   Final    Culture NO GROWTH   Final    Report Status 10/08/2012 FINAL   Final   CULTURE, RESPIRATORY     Status: Normal   Collection Time   10/07/12  4:07 PM      Component Value Range Status Comment   Specimen Description TRACHEAL ASPIRATE   Final    Special  Requests NONE   Final    Gram Stain     Final    Value: ABUNDANT WBC PRESENT, PREDOMINANTLY PMN     NO SQUAMOUS EPITHELIAL CELLS SEEN     FEW GRAM POSITIVE RODS  Culture     Final    Value: ABUNDANT DIPHTHEROIDS(CORYNEBACTERIUM SPECIES)     Note: Standardized susceptibility testing for this organism is not available.   Report Status 10/10/2012 FINAL   Final   CULTURE, RESPIRATORY     Status: Normal (Preliminary result)   Collection Time   10/13/12  5:24 PM      Component Value Range Status Comment   Specimen Description TRACHEAL ASPIRATE   Final    Special Requests NONE   Final    Gram Stain PENDING   Incomplete    Culture Culture reincubated for better growth   Final    Report Status PENDING   Incomplete      Studies: No results found.  Scheduled Meds:    . albuterol  2.5 mg Nebulization Q4H  . antiseptic oral rinse  15 mL Mouth Rinse QID  . aspirin  81 mg Oral Daily  . budesonide  0.25 mg Nebulization BID  . chlorhexidine  15 mL Mouth Rinse BID  . dextrose      . dextrose      . furosemide  20 mg Oral Daily  . insulin aspart  0-15 Units Subcutaneous 6 X Daily  . insulin aspart  1 Units Subcutaneous TID WC  . insulin glargine  5 Units Subcutaneous Q24H  . levofloxacin (LEVAQUIN) IV  750 mg Intravenous Q24H  . levothyroxine  175 mcg Oral Daily  . lipase/protease/amylase  1 capsule Oral TID AC  . magnesium oxide  200 mg Oral BID  . OLANZapine  10 mg Oral QHS  . predniSONE  40 mg Oral Q breakfast  . tiotropium  18 mcg Inhalation Daily   Continuous Infusions:    . dextrose      Active Problems:  Septic shock(785.52)    Time spent: > 35 minutes    Penny Pia  [facility] Pager [phone]. If 8PM-8AM, please contact night-coverage at [email or web address], password [facility] 10/14/2012, 12:31 PM  LOS: 7 days

## 2012-10-15 DIAGNOSIS — E785 Hyperlipidemia, unspecified: Secondary | ICD-10-CM

## 2012-10-15 LAB — BASIC METABOLIC PANEL
BUN: 16 mg/dL (ref 6–23)
Chloride: 101 mEq/L (ref 96–112)
GFR calc Af Amer: >90 mL/min (ref >90)
GFR calc non Af Amer: >90 mL/min (ref >90)
Glucose, Bld: 202 mg/dL — ABNORMAL HIGH (ref 70–99)
Potassium: 3 mEq/L — ABNORMAL LOW (ref 3.5–5.1)
Sodium: 144 mEq/L (ref 135–145)

## 2012-10-15 LAB — GLUCOSE, CAPILLARY
Glucose-Capillary: 217 mg/dL — ABNORMAL HIGH (ref 70–99)
Glucose-Capillary: 168 mg/dL — ABNORMAL HIGH (ref 70–99)
Glucose-Capillary: 140 mg/dL — ABNORMAL HIGH (ref 70–99)

## 2012-10-15 LAB — CBC
HCT: 28.6 % — ABNORMAL LOW (ref 36.0–46.0)
Hemoglobin: 9.1 g/dL — ABNORMAL LOW (ref 12.0–15.0)
MCH: 29.7 pg (ref 26.0–34.0)
MCHC: 31.8 g/dL (ref 30.0–36.0)
RBC: 3.06 MIL/uL — ABNORMAL LOW (ref 3.87–5.11)

## 2012-10-15 MED ORDER — LEVOFLOXACIN 750 MG PO TABS
750.0000 mg | ORAL_TABLET | Freq: Every day | ORAL | Status: DC
  Administered 2012-10-15: 750 mg via ORAL
  Filled 2012-10-15: qty 1

## 2012-10-15 MED ORDER — FUROSEMIDE 20 MG PO TABS: 20.0000 mg | ORAL_TABLET | Freq: Every day | ORAL | Status: DC

## 2012-10-15 MED ORDER — LEVOFLOXACIN 500 MG PO TABS: 500.0000 mg | ORAL_TABLET | Freq: Every day | ORAL | Status: DC

## 2012-10-15 MED ORDER — PREDNISONE (PAK) 10 MG PO TABS: 10.0000 mg | ORAL_TABLET | Freq: Every day | ORAL | Status: DC

## 2012-10-15 MED ORDER — POTASSIUM CHLORIDE CRYS ER 20 MEQ PO TBCR
40.0000 meq | EXTENDED_RELEASE_TABLET | Freq: Once | ORAL | Status: AC
  Administered 2012-10-15: 40 meq via ORAL
  Filled 2012-10-15: qty 2

## 2012-10-15 MED ORDER — INSULIN DETEMIR 100 UNIT/ML ~~LOC~~ SOLN: 3.0000 [IU] | Freq: Every day | SUBCUTANEOUS | Status: DC

## 2012-10-15 NOTE — Clinical Social Work Note (Signed)
CSW was notified by MD that Pt is medically cleared to return to SNF for st-rehab. CSW spoke to Pt who is agreeable and understands the recommendation. She was only at [facility] 1 night before getting sent to the hospital for acute care.  Pt is agreeable to returning to SNF. CSW also contacted Pt's friend and POA Dian Queen and he is agreeable to meeting Pt at SNF later today. The Pt's clothes were cut in the ED and Pt's friend was not able to get Pt clothes for dc, so CSW was able to find clothes for the Pt. Pt was grateful for staff assistance.  CSW arranged non-emergency transport for dc.    No further needs.   Frederico Hamman, LCSW [phone]

## 2012-10-15 NOTE — Discharge Summary (Signed)
Physician Discharge Summary  [name] ZOX:[id number] DOB: Nov 18, 1941 DOA: 10/07/2012  PCP: Maryelizabeth Rowan, MD  Admit date: 10/07/2012 Discharge date: 10/15/2012  Recommendations for Outpatient Follow-up:  1. Please be sure to follow up with breathing status and adjust medications as needed.  2. Be sure to follow up with blood sugars 3. F/u with potassium levels  Discharge Diagnoses:  Active Problems:  Septic shock(785.52)   Discharge Condition: stable  Diet recommendation: Cardiac/diabetic diet  Filed Weights   10/10/12 0400 10/11/12 0327 10/11/12 0500  Weight: 71 kg (156 lb 8.4 oz) 73.5 kg (162 lb 0.6 oz) 73.5 kg (162 lb 0.6 oz)    History of present illness:  71 y/o F, SNF resident, with PMH of HTN, COPD, DM, Hypothyroidism, cirrhosis, CVA (04) who presented to [facility] ED via EMS after being found unresponsive with ? pulse requiring approximately 5 minutes of CPR by SNF staff, attempts at intubation per EMS unsuccessful, CBG noted to be 27. Upon arrival was intubated in ER. Recent admit to [facility] 9/29 - 10/11 for AECOPD, Bipolar disorder with psychosis. PCCM called for ICU admit.    Hospital Course:  1. Acute respiratory failure - Pt at this point is extubated and saturating well on supplemental oxygen via [place]. Last oxygen saturation 99% with respirations at 18  - great response to lasix 20 mg IV yesterday with net output of 4,800 ml and improvement of SOB with lasix administration.  Of note patient has normal systolic function with an EF of 55-60% and no reported diastolic dysfunction reported.  Will provide only a few more days of lasix 20 mg po daily and can f/u with PCP on post hospital follow up.  - Early chest xrays interpreted as bibasilar patchy airspace disease, suspect flash pulmonary edema vs infectious etiology as cause  - Will discharge patient on Levaquin to complete a total course of 12 days of antibiotic therapy.  2. Septic Shock  -  Resolved with IV and oral antibiotic regimen. - Vitals currently within normal limits  - continue to monitor closely   3. COPD  - most likely contributed to # 1  - Continue albuterol, prednisone, budesonide, oral antibiotics. Provide script for prednisone taper - will continue supplemental oxygen at this juncture as outpatient. - sputum culture pending  - urine legionella antigen negative - Strep pneumo urine antigen negative   4. DM  - Diabetic diet  - Will plan on decreasing Levemir to 3 units QHS with frequent blood sugar monitoring Q AM fasting and QAC  5. Hypothyroidism  - Continue current regimen levothyroxine   6. Bipolar disorder  - stable patient is currently on olanzapine    Procedures: Intubation/central line 10/08/12  Consultations:  Managed by PCCM initially and last seen by Dr. Vassie Loll  Discharge Exam: Filed Vitals:   10/15/12 0226 10/15/12 0457 10/15/12 0907 10/15/12 1321  BP:  109/61  123/79  Pulse:  83  98  Temp:  98.7 F (37.1 C)  98.9 F (37.2 C)  TempSrc:  Oral  Oral  Resp:  19  19  Height:      Weight:      SpO2: 100% 99% 100% 97%    General: Pt in NAD, Alert and Awake Cardiovascular: RRR, No MRG Respiratory: mild rhales at bases, prolonged expiratory phase, no wheezes Abdomen: nontender, non distended Neuro: Answers questions appropriately and moves all extremities  Discharge Instructions  Discharge Orders    Future Orders Please Complete By Expires   Diet -  low sodium heart healthy      Increase activity slowly      Discharge instructions      Comments:   Please be sure to follow up with your primary care physician in 1-2 weeks or sooner should any new concerns arise.  Also check blood sugars fasting in the morning and Q AC.  Record blood sugars and report to Primary care physician   Call MD for:  temperature >100.4      Call MD for:  difficulty breathing, headache or visual disturbances          Medication List     As of  10/15/2012  1:35 PM    STOP taking these medications         magnesium oxide 400 (241.3 MG) MG tablet   Commonly known as: MAG-OX      predniSONE 20 MG tablet   Commonly known as: DELTASONE      TAKE these medications         aspirin EC 81 MG tablet   Take 81 mg by mouth daily.      budesonide 0.25 MG/2ML nebulizer solution   Commonly known as: PULMICORT   Take 0.25 mg by nebulization 2 (two) times daily.      cholecalciferol 1000 UNITS tablet   Commonly known as: VITAMIN D   Take 5,000 Units by mouth daily.      CREON 24000 UNITS Cpep   Generic drug: Pancrelipase (Lip-Prot-Amyl)   Take 2 capsules by mouth 3 (three) times daily with meals.      furosemide 20 MG tablet   Commonly known as: LASIX   Take 1 tablet (20 mg total) by mouth daily.      insulin detemir 100 UNIT/ML injection   Commonly known as: LEVEMIR   Inject 3 Units into the skin at bedtime.      levofloxacin 500 MG tablet   Commonly known as: LEVAQUIN   Take 1 tablet (500 mg total) by mouth daily.   Start taking on: 10/16/2012      levothyroxine 175 MCG tablet   Commonly known as: SYNTHROID, LEVOTHROID   Take 175 mcg by mouth daily.      multivitamin with minerals Tabs   Take 1 tablet by mouth daily.      OLANZapine 10 MG tablet   Commonly known as: ZYPREXA   Take 10 mg by mouth at bedtime.      pravastatin 40 MG tablet   Commonly known as: PRAVACHOL   Take 40 mg by mouth every evening.      predniSONE 10 MG tablet   Commonly known as: STERAPRED UNI-PAK   Take 1 tablet (10 mg total) by mouth daily. Take 4 tabs (40 mg) po daily for the next 2 days, then take 3 tabs (30 mg) po daily for the next 2 days, then take 2 tabs (20 mg) po for the next 2 days, then 1 tab (10 mg) for the next 3 days      tiotropium 18 MCG inhalation capsule   Commonly known as: SPIRIVA   Place 18 mcg into inhaler and inhale daily.      vitamin B-12 1000 MCG tablet   Commonly known as: CYANOCOBALAMIN   Take 1,000 mcg  by mouth daily.          The results of significant diagnostics from this hospitalization (including imaging, microbiology, ancillary and laboratory) are listed below for reference.    Significant Diagnostic Studies: Ct Head  Wo Contrast  10/07/2012  *RADIOLOGY REPORT*  Clinical Data: Status post cardiac arrest.  Unresponsive.  Previous stroke.  Diabetes hypertension.  CT HEAD WITHOUT CONTRAST  Technique:  Contiguous axial images were obtained from the base of the skull through the vertex without contrast.  Comparison: 09/06/2012 and 12/12/2003  Findings: There is no evidence of intracranial hemorrhage, brain edema or other signs of acute infarction.  There is no evidence of intracranial mass lesion or mass effect.  No abnormal extra-axial fluid collections are identified.  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  Mild diffuse cerebral atrophy is also stable.  Ventricles are stable in size.  No skull abnormality identified.  IMPRESSION:  1.  No acute intracranial abnormality. 2.  Old left parietal infarct. 3.  Mild cerebral atrophy.   Original Report Authenticated By: Danae Orleans, M.D.    Dg Chest Port 1 View  10/11/2012  *RADIOLOGY REPORT*  Clinical Data: Pneumonia.  Intubated patient.  PORTABLE CHEST - 1 VIEW  Comparison: Chest 10/10/2012 and 10/09/2012.  Findings: Support tubes and lines are unchanged.  Small bilateral pleural effusions and basilar atelectasis have increased.  No pneumothorax is identified.  Heart size is normal.  IMPRESSION: Increased small bilateral pleural effusions and basilar airspace disease.   Original Report Authenticated By: Bernadene Bell. Maricela Curet, M.D.    Dg Chest Port 1 View  10/10/2012  *RADIOLOGY REPORT*  Clinical Data: Right lower lobe pneumonia  PORTABLE CHEST - 1 VIEW  Comparison: Yesterday  Findings: Tubular structures stable.   Bibasilar pulmonary opacities improved.  No pneumothorax.  Several external objects project over the chest limiting  the exam.  IMPRESSION: Improved bibasilar opacities.   Original Report Authenticated By: Donavan Burnet, M.D.    Dg Chest Port 1 View  10/09/2012  *RADIOLOGY REPORT*  Clinical Data: Right lower lobe pneumonia.  Assess ET tube.  PORTABLE CHEST - 1 VIEW  Comparison: 10/08/2012  Findings: Right lower lobe pneumonia. Support devices are in stable position.  Bibasilar airspace opacities are again noted, right greater than left.  Suspect small bilateral effusions.  No real change on the right.  Airspace disease likely slightly increased on the left.  IMPRESSION:   Bibasilar opacities and effusions, slightly worsened on the left.   Original Report Authenticated By: Cyndie Chime, M.D.    Dg Chest Port 1 View  10/08/2012  *RADIOLOGY REPORT*  Clinical Data: Line placement.  PORTABLE CHEST - 1 VIEW  Comparison: Same day.  Findings: Cardiomediastinal silhouette appears normal. Endotracheal tube is in grossly good position with tip several centimeters above the carina.  Nasogastric tube in the stomach. Left lung is clear.  Right basilar opacity is noted which is unchanged compared to prior exam.  There has been interval placement of left internal jugular catheter with tip in the expected position of the SVC.  No pneumothorax is noted.  IMPRESSION: Right basilar opacity concerning for pneumonia or atelectasis. Left internal jugular catheter tip is noted in expected position of the SVC.   Original Report Authenticated By: Venita Sheffield., M.D.    Portable Chest Xray In Am  10/08/2012  *RADIOLOGY REPORT*  Clinical Data: Endotracheal tube, evaluate for airspace disease  PORTABLE CHEST - 1 VIEW  Comparison: 10/07/2012; 09/06/2012  Findings:  Grossly unchanged cardiac silhouette and mediastinal contours. Stable positioning of support apparatus.  No pneumothorax.  No change to minimally worsening right lower lung heterogeneous air space opacities.  There is mild eventration of the left hemidiaphragm.  Slight worsening  of left basilar linear opacities favored to represent atelectasis. Grossly unchanged blunting of the right costophrenic angle suggestive of a small right-sided effusion.  No definite left-sided pleural effusion.  Unchanged bones.  IMPRESSION: 1.  Stable positioning of support apparatus.  No pneumothorax. 2.  No change to minimal worsening right lower lung airspace opacities worrisome for infection. 3. Unchanged small right-sided effusion.   Original Report Authenticated By: Waynard Reeds, M.D.    Dg Chest Portable 1 View  10/07/2012  *RADIOLOGY REPORT*  Clinical Data: Cardiac arrest  PORTABLE CHEST - 1 VIEW  Comparison: 09/06/2012  Findings: Endotracheal tube has been placed with its tip 3.0 cm from the carina.  Patchy and ill-defined opacities have developed at the right lung base.  Minimal patchy density at the retrocardiac left base.  Emphysema.  Blunting of postcontrast chronic angles is stable.  No pneumothorax.  IMPRESSION: Bibasilar patchy airspace disease.  Endotracheal tube placed.   Original Report Authenticated By: Donavan Burnet, M.D.    Dg Chest Port 1v Same Day  10/07/2012  *RADIOLOGY REPORT*  Clinical Data: Central venous line.  Evaluate endotracheal tube.  PORTABLE CHEST - 1 VIEW SAME DAY  Comparison: Chest x-ray 10/07/2012.  Findings: An endotracheal tube is in place with tip 3.6 cm above the carina. A nasogastric tube is seen extending into the stomach, however, the tip of the nasogastric tube extends below the lower margin of the image.  Worsening airspace consolidation in the right lower lobe partially obscuring the right hemidiaphragm now. Blunting of the right costophrenic sulcus indicative of enlarging small right pleural effusion.  Left lung is clear.  Pulmonary vasculature is normal.  Cardiomediastinal silhouette is within normal limits.  Atherosclerosis in the thoracic aorta.  IMPRESSION: 1.  Support apparatus, as above. 2.  Increasing airspace consolidation in the right lower  lobe compatible with evolving pneumonia or sequela of aspiration. 3.  Enlarging small right pleural effusion. 4.  Atherosclerosis.   Original Report Authenticated By: Florencia Reasons, M.D.     Microbiology: Recent Results (from the past 240 hour(s))  CULTURE, BLOOD (ROUTINE X 2)     Status: Normal   Collection Time   10/07/12  8:37 AM      Component Value Range Status Comment   Specimen Description BLOOD CENTRAL LINE   Final    Special Requests BOTTLES DRAWN AEROBIC AND ANAEROBIC 10CC   Final    Culture  Setup Time 10/07/2012 14:18   Final    Culture     Final    Value: STAPHYLOCOCCUS SPECIES (COAGULASE NEGATIVE)     Note: THE SIGNIFICANCE OF ISOLATING THIS ORGANISM FROM A SINGLE SET OF BLOOD CULTURES WHEN MULTIPLE SETS ARE DRAWN IS UNCERTAIN. PLEASE NOTIFY THE MICROBIOLOGY DEPARTMENT WITHIN ONE WEEK IF SPECIATION AND SENSITIVITIES ARE REQUIRED.     Note: Gram Stain Report Called to,Read Back By and Verified With: SARA HERBERT [id number] 10/08/12 BY KRAWS   Report Status 10/09/2012 FINAL   Final   CULTURE, BLOOD (ROUTINE X 2)     Status: Normal   Collection Time   10/07/12 11:47 AM      Component Value Range Status Comment   Specimen Description BLOOD LEFT HAND   Final    Special Requests BOTTLES DRAWN AEROBIC ONLY 2.5CC   Final    Culture  Setup Time 10/07/2012 21:35   Final    Culture NO GROWTH 5 DAYS   Final    Report Status 10/13/2012 FINAL  Final   MRSA PCR SCREENING     Status: Normal   Collection Time   10/07/12 12:46 PM      Component Value Range Status Comment   MRSA by PCR NEGATIVE  NEGATIVE Final   URINE CULTURE     Status: Normal   Collection Time   10/07/12  1:00 PM      Component Value Range Status Comment   Specimen Description URINE, CATHETERIZED   Final    Special Requests NONE   Final    Culture  Setup Time 10/07/2012 21:41   Final    Colony Count NO GROWTH   Final    Culture NO GROWTH   Final    Report Status 10/08/2012 FINAL   Final   CULTURE, RESPIRATORY      Status: Normal   Collection Time   10/07/12  4:07 PM      Component Value Range Status Comment   Specimen Description TRACHEAL ASPIRATE   Final    Special Requests NONE   Final    Gram Stain     Final    Value: ABUNDANT WBC PRESENT, PREDOMINANTLY PMN     NO SQUAMOUS EPITHELIAL CELLS SEEN     FEW GRAM POSITIVE RODS   Culture     Final    Value: ABUNDANT DIPHTHEROIDS(CORYNEBACTERIUM SPECIES)     Note: Standardized susceptibility testing for this organism is not available.   Report Status 10/10/2012 FINAL   Final   CULTURE, RESPIRATORY     Status: Normal (Preliminary result)   Collection Time   10/13/12  5:24 PM      Component Value Range Status Comment   Specimen Description TRACHEAL ASPIRATE   Final    Special Requests NONE   Final    Gram Stain     Final    Value: MODERATE WBC PRESENT,BOTH PMN AND MONONUCLEAR     FEW SQUAMOUS EPITHELIAL CELLS PRESENT     RARE GRAM POSITIVE COCCI     IN PAIRS   Culture Non-Pathogenic Oropharyngeal-type Flora Isolated.   Final    Report Status PENDING   Incomplete      Labs: Basic Metabolic Panel:  Lab 10/15/12 1610 10/13/12 0440 10/12/12 0450 10/11/12 1703 10/11/12 0427 10/09/12 0400  NA 144 146* 145 147* 143 --  K 3.0* 3.5 3.6 2.9* 2.8* --  CL 101 105 106 104 103 --  CO2 39* 37* 35* 37* 32 --  GLUCOSE 202* 96 108* 139* 167* --  BUN 16 20 25* 26* 27* --  CREATININE 0.57 0.50 0.46* 0.41* 0.45* --  CALCIUM 8.1* 8.3* 8.0* 8.3* 8.1* --  MG -- -- -- -- -- 1.8  PHOS -- -- -- -- -- --   Liver Function Tests: No results found for this basename: AST:5,ALT:5,ALKPHOS:5,BILITOT:5,PROT:5,ALBUMIN:5 in the last 168 hours No results found for this basename: LIPASE:5,AMYLASE:5 in the last 168 hours No results found for this basename: AMMONIA:5 in the last 168 hours CBC:  Lab 10/15/12 0500 10/13/12 0440 10/12/12 0450 10/11/12 0427 10/10/12 0126  WBC 8.7 11.1* 9.5 11.1* 14.1*  NEUTROABS -- -- -- -- --  HGB 9.1* 10.2* 9.2* 9.3* 8.9*  HCT 28.6* 33.0*  28.8* 28.1* 27.1*  MCV 93.5 94.3 92.3 91.8 89.7  PLT 137* 139* 137* 152 147*   Cardiac Enzymes: No results found for this basename: CKTOTAL:5,CKMB:5,CKMBINDEX:5,TROPONINI:5 in the last 168 hours BNP: BNP (last 3 results)  Basename 10/07/12 0721  PROBNP 465.5*   CBG:  Lab 10/15/12 1132 10/15/12  1610 10/15/12 0456 10/15/12 0132 10/14/12 2007  GLUCAP 140* 168* 181* 217* 232*    Time coordinating discharge: > 35  minutes  Signed:  Penny Pia  [facility] 10/15/2012, 1:35 PM

## 2012-10-15 NOTE — Progress Notes (Signed)
DC'd left IJ per MD order per hospital protocol. Applied Vaseline and gauze and held pressure for 5 minutes. Advised patient to remain in bed for 30 minutes. Patient tolerated well. Will continue to monitor closely. Lajuana Matte, RN

## 2012-10-16 LAB — CULTURE, RESPIRATORY W GRAM STAIN

## 2013-06-19 ENCOUNTER — Encounter (HOSPITAL_COMMUNITY): Payer: Self-pay

## 2013-06-19 ENCOUNTER — Emergency Department (HOSPITAL_COMMUNITY)
Admission: EM | Admit: 2013-06-19 | Discharge: 2013-06-19 | Disposition: A | Discharge status: Home / Self Care | Payer: Medicare Other | Attending: Emergency Medicine | Admitting: Emergency Medicine

## 2013-06-19 ENCOUNTER — Emergency Department (HOSPITAL_COMMUNITY): Payer: Medicare Other

## 2013-06-19 DIAGNOSIS — IMO0002 Reserved for concepts with insufficient information to code with codable children: Secondary | ICD-10-CM | POA: Insufficient documentation

## 2013-06-19 DIAGNOSIS — F1011 Alcohol abuse, in remission: Secondary | ICD-10-CM | POA: Insufficient documentation

## 2013-06-19 DIAGNOSIS — J449 Chronic obstructive pulmonary disease, unspecified: Secondary | ICD-10-CM

## 2013-06-19 DIAGNOSIS — I10 Essential (primary) hypertension: Secondary | ICD-10-CM | POA: Insufficient documentation

## 2013-06-19 DIAGNOSIS — K7030 Alcoholic cirrhosis of liver without ascites: Secondary | ICD-10-CM | POA: Insufficient documentation

## 2013-06-19 DIAGNOSIS — F29 Unspecified psychosis not due to a substance or known physiological condition: Secondary | ICD-10-CM | POA: Insufficient documentation

## 2013-06-19 DIAGNOSIS — Z7982 Long term (current) use of aspirin: Secondary | ICD-10-CM | POA: Insufficient documentation

## 2013-06-19 DIAGNOSIS — F17200 Nicotine dependence, unspecified, uncomplicated: Secondary | ICD-10-CM | POA: Insufficient documentation

## 2013-06-19 DIAGNOSIS — E119 Type 2 diabetes mellitus without complications: Secondary | ICD-10-CM | POA: Insufficient documentation

## 2013-06-19 DIAGNOSIS — Z794 Long term (current) use of insulin: Secondary | ICD-10-CM | POA: Insufficient documentation

## 2013-06-19 DIAGNOSIS — J441 Chronic obstructive pulmonary disease with (acute) exacerbation: Secondary | ICD-10-CM | POA: Insufficient documentation

## 2013-06-19 DIAGNOSIS — E039 Hypothyroidism, unspecified: Secondary | ICD-10-CM | POA: Insufficient documentation

## 2013-06-19 DIAGNOSIS — C8589 Other specified types of non-Hodgkin lymphoma, extranodal and solid organ sites: Secondary | ICD-10-CM | POA: Insufficient documentation

## 2013-06-19 DIAGNOSIS — Z8673 Personal history of transient ischemic attack (TIA), and cerebral infarction without residual deficits: Secondary | ICD-10-CM | POA: Insufficient documentation

## 2013-06-19 DIAGNOSIS — E7800 Pure hypercholesterolemia, unspecified: Secondary | ICD-10-CM | POA: Insufficient documentation

## 2013-06-19 DIAGNOSIS — Z79899 Other long term (current) drug therapy: Secondary | ICD-10-CM | POA: Insufficient documentation

## 2013-06-19 MED ORDER — METHYLPREDNISOLONE SODIUM SUCC 125 MG IJ SOLR
125.0000 mg | Freq: Once | INTRAMUSCULAR | Status: AC
  Administered 2013-06-19: 125 mg via INTRAVENOUS
  Filled 2013-06-19: qty 2

## 2013-06-19 MED ORDER — ALBUTEROL SULFATE (5 MG/ML) 0.5% IN NEBU
5.0000 mg | INHALATION_SOLUTION | Freq: Once | RESPIRATORY_TRACT | Status: AC
  Administered 2013-06-19: 5 mg via RESPIRATORY_TRACT
  Filled 2013-06-19: qty 1

## 2013-06-19 MED ORDER — PREDNISONE 10 MG PO TABS: 20.0000 mg | ORAL_TABLET | Freq: Every day | ORAL | Status: DC

## 2013-06-19 MED ORDER — IPRATROPIUM BROMIDE 0.02 % IN SOLN
0.5000 mg | Freq: Once | RESPIRATORY_TRACT | Status: AC
  Administered 2013-06-19: 0.5 mg via RESPIRATORY_TRACT
  Filled 2013-06-19: qty 2.5

## 2013-06-19 NOTE — ED Notes (Signed)
[id number]<JY> Expected date:<BR> Expected time:<BR> Means of arrival:<BR> Comments:<BR> SOB/Wheezing

## 2013-06-19 NOTE — ED Notes (Signed)
She states she has been shob and wheezing since yesterday evening; unresponsive and progressively worse in spite of home albuterol treatments.  EMS were caled to her home earlier this morning, and she felt well enough not to be transported to hospital, however, as her wheezing persisted she thought better of this and decided to be treated at hospital.

## 2013-06-19 NOTE — ED Notes (Signed)
She smilingly tells [place] she is feeling better; and that she is hungry.  She is more relaxed in appearance, and is minimally short of breath at this time.

## 2013-06-19 NOTE — ED Provider Notes (Signed)
History    CSN: [id number] Arrival date & time 06/19/13  6578  First MD Initiated Contact with Patient 06/19/13 0913     Chief Complaint  Patient presents with  . Wheezing   (Consider location/radiation/quality/duration/timing/severity/associated sxs/prior Treatment) Patient is a 72 y.o. female presenting with wheezing. The history is provided by the patient (the pt complains of some sob and wheezing).  Wheezing Severity:  Moderate Onset quality:  Gradual Timing:  Intermittent Progression:  Waxing and waning Chronicity:  Recurrent Context: not animal exposure   Associated symptoms: no chest pain, no cough, no fatigue, no headaches and no rash    Past Medical History  Diagnosis Date  . Hypertension   . COPD (chronic obstructive pulmonary disease)   . Diabetes mellitus   . Elevated cholesterol   . History of ETOH abuse   . Hypothyroid   . Cirrhosis, alcoholic   . Psychosis   . Lymphoma     T cell  . Stroke     Occipital 2004  . Asthma    History reviewed. No pertinent past surgical history. History reviewed. No pertinent family history. History  Substance Use Topics  . Smoking status: Current Every Day Smoker -- 0.50 packs/day for 56 years    Types: Cigarettes  . Smokeless tobacco: Never Used  . Alcohol Use: No     Comment: Sober since 64   OB History   Grav Para Term Preterm Abortions TAB SAB Ect Mult Living                 Review of Systems  Constitutional: Negative for appetite change and fatigue.  HENT: Negative for congestion, sinus pressure and ear discharge.   Eyes: Negative for discharge.  Respiratory: Positive for wheezing. Negative for cough.   Cardiovascular: Negative for chest pain.  Gastrointestinal: Negative for abdominal pain and diarrhea.  Genitourinary: Negative for frequency and hematuria.  Musculoskeletal: Negative for back pain.  Skin: Negative for rash.  Neurological: Negative for seizures and headaches.  Psychiatric/Behavioral:  Negative for hallucinations.    Allergies  Review of patient's allergies indicates no known allergies.  Home Medications   Current Outpatient Rx  Name  Route  Sig  Dispense  Refill  . aspirin EC 81 MG tablet   Oral   Take 81 mg by mouth daily.         . budesonide (PULMICORT) 0.25 MG/2ML nebulizer solution   Nebulization   Take 0.25 mg by nebulization 2 (two) times daily.         . furosemide (LASIX) 20 MG tablet   Oral   Take 20 mg by mouth daily.         . insulin detemir (LEVEMIR) 100 UNIT/ML injection   Subcutaneous   Inject 3 Units into the skin at bedtime.   10 mL   0   . levothyroxine (SYNTHROID, LEVOTHROID) 175 MCG tablet   Oral   Take 175 mcg by mouth daily.         . Multiple Vitamin (MULTIVITAMIN WITH MINERALS) TABS   Oral   Take 1 tablet by mouth daily.         Marland Kitchen OLANZapine (ZYPREXA) 10 MG tablet   Oral   Take 10 mg by mouth at bedtime.         . pravastatin (PRAVACHOL) 40 MG tablet   Oral   Take 40 mg by mouth every evening.         . tiotropium (SPIRIVA) 18 MCG inhalation  capsule   Inhalation   Place 18 mcg into inhaler and inhale daily.         Marland Kitchen levofloxacin (LEVAQUIN) 500 MG tablet   Oral   Take 1 tablet (500 mg total) by mouth daily.   3 tablet   0   . Pancrelipase, Lip-Prot-Amyl, (CREON) 24000 UNITS CPEP   Oral   Take 2 capsules by mouth 3 (three) times daily with meals.         . predniSONE (DELTASONE) 10 MG tablet   Oral   Take 2 tablets (20 mg total) by mouth daily.   6 tablet   0   . vitamin B-12 (CYANOCOBALAMIN) 1000 MCG tablet   Oral   Take 1,000 mcg by mouth daily.          BP 136/88  Pulse 108  Resp 25  SpO2 99% Physical Exam  Constitutional: She is oriented to person, place, and time. She appears well-developed.  HENT:  Head: Normocephalic.  Eyes: Conjunctivae and EOM are normal. No scleral icterus.  Neck: Neck supple. No thyromegaly present.  Cardiovascular: Normal rate and regular rhythm.   Exam reveals no gallop and no friction rub.   No murmur heard. Pulmonary/Chest: No stridor. She has wheezes. She has no rales. She exhibits no tenderness.  Abdominal: She exhibits no distension. There is no tenderness. There is no rebound.  Musculoskeletal: Normal range of motion. She exhibits no edema.  Lymphadenopathy:    She has no cervical adenopathy.  Neurological: She is oriented to person, place, and time. Coordination normal.  Skin: No rash noted. No erythema.  Psychiatric: She has a normal mood and affect. Her behavior is normal.    ED Course  Procedures (including critical care time) Labs Reviewed - No data to display Dg Chest [facility] 1 View  06/19/2013   *RADIOLOGY REPORT*  Clinical Data: Cough and weakness.  PORTABLE CHEST - 1 VIEW  Comparison: 10/11/2012 and 10/10/2012 radiographs.  Findings: 0938 hours.  The endotracheal tube, nasogastric tube and central line have been removed in the interval.  Heart size and mediastinal contours are stable.  Pleural effusions and bibasilar air space opacities have resolved.  The lungs are now clear.  IMPRESSION: No active cardiopulmonary process.   Original Report Authenticated By: Carey Bullocks, M.D.   1. COPD (chronic obstructive pulmonary disease)     MDM  Mild exacerbation copd  Benny Lennert, MD 06/19/13 1228

## 2013-10-04 ENCOUNTER — Emergency Department (HOSPITAL_COMMUNITY): Payer: Medicare Other

## 2013-10-04 ENCOUNTER — Encounter (HOSPITAL_COMMUNITY): Payer: Self-pay | Admitting: Emergency Medicine

## 2013-10-04 ENCOUNTER — Emergency Department (HOSPITAL_COMMUNITY)
Admission: EM | Admit: 2013-10-04 | Discharge: 2013-10-04 | Disposition: A | Discharge status: Home / Self Care | Payer: Medicare Other | Attending: Emergency Medicine | Admitting: Emergency Medicine

## 2013-10-04 DIAGNOSIS — Z8719 Personal history of other diseases of the digestive system: Secondary | ICD-10-CM | POA: Insufficient documentation

## 2013-10-04 DIAGNOSIS — M25539 Pain in unspecified wrist: Secondary | ICD-10-CM | POA: Insufficient documentation

## 2013-10-04 DIAGNOSIS — Z8659 Personal history of other mental and behavioral disorders: Secondary | ICD-10-CM | POA: Insufficient documentation

## 2013-10-04 DIAGNOSIS — IMO0002 Reserved for concepts with insufficient information to code with codable children: Secondary | ICD-10-CM | POA: Insufficient documentation

## 2013-10-04 DIAGNOSIS — R52 Pain, unspecified: Secondary | ICD-10-CM | POA: Insufficient documentation

## 2013-10-04 DIAGNOSIS — Z794 Long term (current) use of insulin: Secondary | ICD-10-CM | POA: Insufficient documentation

## 2013-10-04 DIAGNOSIS — Z792 Long term (current) use of antibiotics: Secondary | ICD-10-CM | POA: Insufficient documentation

## 2013-10-04 DIAGNOSIS — Z8673 Personal history of transient ischemic attack (TIA), and cerebral infarction without residual deficits: Secondary | ICD-10-CM | POA: Insufficient documentation

## 2013-10-04 DIAGNOSIS — J4489 Other specified chronic obstructive pulmonary disease: Secondary | ICD-10-CM | POA: Insufficient documentation

## 2013-10-04 DIAGNOSIS — M25532 Pain in left wrist: Secondary | ICD-10-CM

## 2013-10-04 DIAGNOSIS — E039 Hypothyroidism, unspecified: Secondary | ICD-10-CM | POA: Insufficient documentation

## 2013-10-04 DIAGNOSIS — I10 Essential (primary) hypertension: Secondary | ICD-10-CM | POA: Insufficient documentation

## 2013-10-04 DIAGNOSIS — E7800 Pure hypercholesterolemia, unspecified: Secondary | ICD-10-CM | POA: Insufficient documentation

## 2013-10-04 DIAGNOSIS — F1021 Alcohol dependence, in remission: Secondary | ICD-10-CM | POA: Insufficient documentation

## 2013-10-04 DIAGNOSIS — E119 Type 2 diabetes mellitus without complications: Secondary | ICD-10-CM | POA: Insufficient documentation

## 2013-10-04 DIAGNOSIS — Z79899 Other long term (current) drug therapy: Secondary | ICD-10-CM | POA: Insufficient documentation

## 2013-10-04 DIAGNOSIS — Z7982 Long term (current) use of aspirin: Secondary | ICD-10-CM | POA: Insufficient documentation

## 2013-10-04 DIAGNOSIS — J449 Chronic obstructive pulmonary disease, unspecified: Secondary | ICD-10-CM | POA: Insufficient documentation

## 2013-10-04 DIAGNOSIS — F17200 Nicotine dependence, unspecified, uncomplicated: Secondary | ICD-10-CM | POA: Insufficient documentation

## 2013-10-04 MED ORDER — PREDNISONE 20 MG PO TABS: 40.0000 mg | ORAL_TABLET | Freq: Every day | ORAL | Status: DC

## 2013-10-04 MED ORDER — HYDROCODONE-ACETAMINOPHEN 5-325 MG PO TABS: ORAL_TABLET | ORAL | Status: DC

## 2013-10-04 MED ORDER — HYDROCODONE-ACETAMINOPHEN 5-325 MG PO TABS
2.0000 | ORAL_TABLET | Freq: Once | ORAL | Status: AC
  Administered 2013-10-04: 2 via ORAL
  Filled 2013-10-04: qty 2

## 2013-10-04 NOTE — Discharge Instructions (Signed)
Narcotic and benzodiazepine use may cause drowsiness, slowed breathing or dependence.  Please use with caution and do not drive, operate machinery or watch young children alone while taking them.  Taking combinations of these medications or drinking alcohol will potentiate these effects.    

## 2013-10-04 NOTE — ED Notes (Signed)
Pt's husband states he "thinks pt may have had a mini stroke" - states pt woke up this morning with left arm pain. Denies numbness/tingling. Pt denies any other symptoms. Hx of COPD - wears 3.5L O2 at home

## 2013-10-04 NOTE — ED Provider Notes (Signed)
CSN: [id number]     Arrival date & time 10/04/13  4098 History   First MD Initiated Contact with Patient 10/04/13 [phone]     Chief Complaint  Patient presents with  . Arm Pain   (Consider location/radiation/quality/duration/timing/severity/associated sxs/prior Treatment) HPI Comments: Pt denies injury, fall, pain woke her up around 0600.  Pt denies weakness.  Pt brought by spouse.  No h/o joint problems, gout in the past.  No skin discoloration, no numbness or weakness.  Hurts to bend at wrist.  No elbow or shoulder pain.    Patient is a 72 y.o. female presenting with wrist pain. The history is provided by the patient.  Wrist Pain This is a new problem. The current episode started 3 to 5 hours ago. The problem occurs constantly. The problem has not changed since onset.Pertinent negatives include no chest pain and no shortness of breath. The symptoms are aggravated by bending and twisting. Nothing relieves the symptoms. She has tried nothing for the symptoms.    Past Medical History  Diagnosis Date  . Hypertension   . COPD (chronic obstructive pulmonary disease)   . Diabetes mellitus   . Elevated cholesterol   . History of ETOH abuse   . Hypothyroid   . Cirrhosis, alcoholic   . Psychosis   . Lymphoma     T cell  . Stroke     Occipital 2004  . Asthma    History reviewed. No pertinent past surgical history. History reviewed. No pertinent family history. History  Substance Use Topics  . Smoking status: Current Every Day Smoker -- 0.50 packs/day for 56 years    Types: Cigarettes  . Smokeless tobacco: Never Used  . Alcohol Use: No     Comment: Sober since 26   OB History   Grav Para Term Preterm Abortions TAB SAB Ect Mult Living                 Review of Systems  Respiratory: Negative for shortness of breath.   Cardiovascular: Negative for chest pain.  Gastrointestinal: Negative for nausea.  Musculoskeletal: Positive for arthralgias.    Allergies  Bee venom  Home  Medications   Current Outpatient Rx  Name  Route  Sig  Dispense  Refill  . aspirin EC 81 MG tablet   Oral   Take 81 mg by mouth daily.         . budesonide (PULMICORT) 0.5 MG/2ML nebulizer solution   Nebulization   Take 0.5 mg by nebulization daily as needed (for inflammation of lungs).         . cholecalciferol (VITAMIN D) 1000 UNITS tablet   Oral   Take 4,000 Units by mouth every morning.         . furosemide (LASIX) 20 MG tablet   Oral   Take 20 mg by mouth daily.         . insulin glargine (LANTUS) 100 UNIT/ML injection   Subcutaneous   Inject 64 Units into the skin at bedtime.         Marland Kitchen ipratropium-albuterol (DUONEB) 0.5-2.5 (3) MG/3ML SOLN   Nebulization   Take 3 mLs by nebulization every 6 (six) hours as needed (for inflammation of lung).         Marland Kitchen levothyroxine (SYNTHROID, LEVOTHROID) 125 MCG tablet   Oral   Take 125 mcg by mouth daily before breakfast.         . metFORMIN (GLUCOPHAGE) 1000 MG tablet   Oral   Take  1,000 mg by mouth 2 (two) times daily with a meal.         . Multiple Vitamin (MULTIVITAMIN WITH MINERALS) TABS   Oral   Take 1 tablet by mouth daily.         Marland Kitchen OLANZapine (ZYPREXA) 10 MG tablet   Oral   Take 10 mg by mouth at bedtime.         . Pancrelipase, Lip-Prot-Amyl, (ZENPEP) 25000 UNITS CPEP   Oral   Take 1-2 capsules by mouth 4 (four) times daily -  before meals and at bedtime. Takes 2 tablets three times daily, and 1 tablet at bedtime         . simvastatin (ZOCOR) 20 MG tablet   Oral   Take 20 mg by mouth every evening.         . sitaGLIPtin (JANUVIA) 100 MG tablet   Oral   Take 100 mg by mouth daily.         . budesonide (PULMICORT) 0.25 MG/2ML nebulizer solution   Nebulization   Take 0.25 mg by nebulization 2 (two) times daily.         . insulin detemir (LEVEMIR) 100 UNIT/ML injection   Subcutaneous   Inject 3 Units into the skin at bedtime.   10 mL   0   . levofloxacin (LEVAQUIN) 500 MG  tablet   Oral   Take 1 tablet (500 mg total) by mouth daily.   3 tablet   0   . levothyroxine (SYNTHROID, LEVOTHROID) 175 MCG tablet   Oral   Take 175 mcg by mouth daily.         . Pancrelipase, Lip-Prot-Amyl, (CREON) 24000 UNITS CPEP   Oral   Take 2 capsules by mouth 3 (three) times daily with meals.         . pravastatin (PRAVACHOL) 40 MG tablet   Oral   Take 40 mg by mouth every evening.         . predniSONE (DELTASONE) 10 MG tablet   Oral   Take 2 tablets (20 mg total) by mouth daily.   6 tablet   0   . tiotropium (SPIRIVA) 18 MCG inhalation capsule   Inhalation   Place 18 mcg into inhaler and inhale daily.         . vitamin B-12 (CYANOCOBALAMIN) 1000 MCG tablet   Oral   Take 1,000 mcg by mouth daily.          BP 121/67  Pulse 85  Temp(Src) 99.1 F (37.3 C) (Oral)  Resp 16  SpO2 100% Physical Exam  Nursing note and vitals reviewed. Constitutional: She appears well-developed and well-nourished.  HENT:  Head: Normocephalic and atraumatic.  Eyes: EOM are normal.  Neck: Normal range of motion. Neck supple.  Cardiovascular: Normal rate, regular rhythm and intact distal pulses.   Pulmonary/Chest: Effort normal.  Abdominal: Soft.  Musculoskeletal: She exhibits tenderness.       Left shoulder: She exhibits no tenderness.       Left elbow: She exhibits no deformity. No tenderness found.       Left wrist: She exhibits decreased range of motion, tenderness, bony tenderness and swelling. She exhibits no effusion, no crepitus, no deformity and no laceration.       Cervical back: She exhibits no tenderness and no bony tenderness.       Right forearm: She exhibits no tenderness, no bony tenderness, no swelling, no edema and no deformity.  Neurological: She is alert. She  exhibits normal muscle tone. Coordination normal.  Good ROM of left shoulder, elbow, but very slow and deliberate.  Pt reports she is slow and deliberate so not to aggravate the pain in her left  wrist  Skin: Skin is warm.    ED Course  Procedures (including critical care time) Labs Review Labs Reviewed - No data to display Imaging Review Dg Wrist Complete Left  10/04/2013   CLINICAL DATA:  Generalized wrist pain. No trauma.  EXAM: LEFT WRIST - COMPLETE 3+ VIEW  COMPARISON:  None.  FINDINGS: Diffuse osteopenia. Nonspecific carpal cyst present within the lunate bone. STT joint osteoarthritis is mild. There is no fracture identified. Carpal space and alignment appears within normal limits. IP joint of the thumb osteoarthritis. Scaphoid bone is intact. Calcification projects over the dorsal radiocarpal joint, probably in the soft tissues superficial to the wrist.  IMPRESSION: Osteopenia. No fracture or acute osseous abnormality.   Electronically Signed   By: Andreas Newport M.D.   On: 10/04/2013 10:02    EKG Interpretation   None      Sat on [place] O2 is 100% and I interpret to be adequate  10:14 AM Plain film shows no fracture.  Pt may have gout.  No signs of infection, cellulitis.  Will give analgesics, also steroids for short course and recommend follow up with PCP  MDM   1. Wrist pain, acute, left    Pt with very tender left wrist on palpation.  Does not seem to equate to CVA or TIA as was spouse concern.  Will monitor, give analgesic, and obtain plain film.      Gavin Pound. Areya Lemmerman, MD 10/04/13 1048

## 2013-10-13 IMAGING — CR DG CHEST 2V
2 series · 2 of 2 positions shown · non-contrast
Comparison: 04/23/2012

CLINICAL DATA: Shortness of breath.  Chest pain.

CHEST - 2 VIEW

[w chest lat]
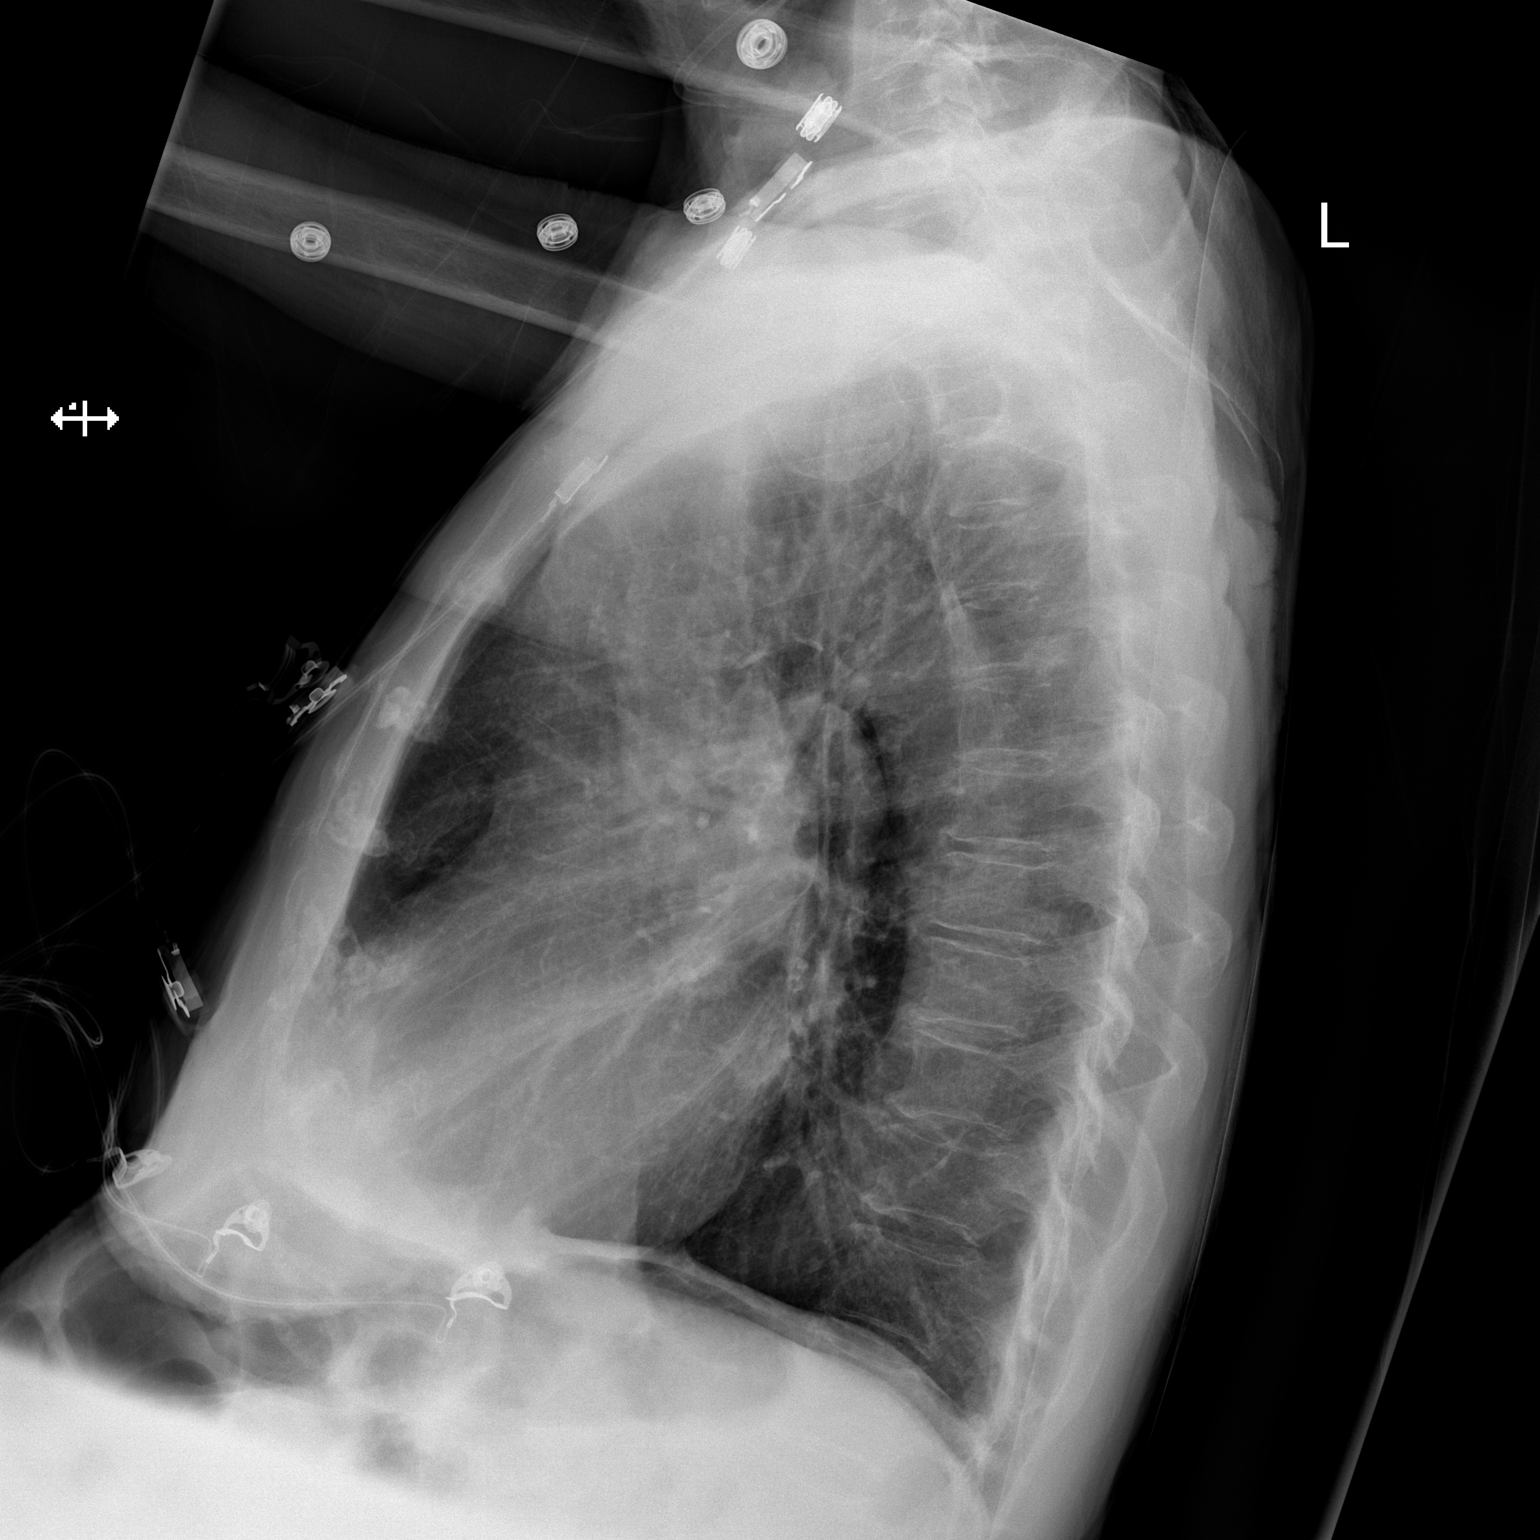

[x chest ap]
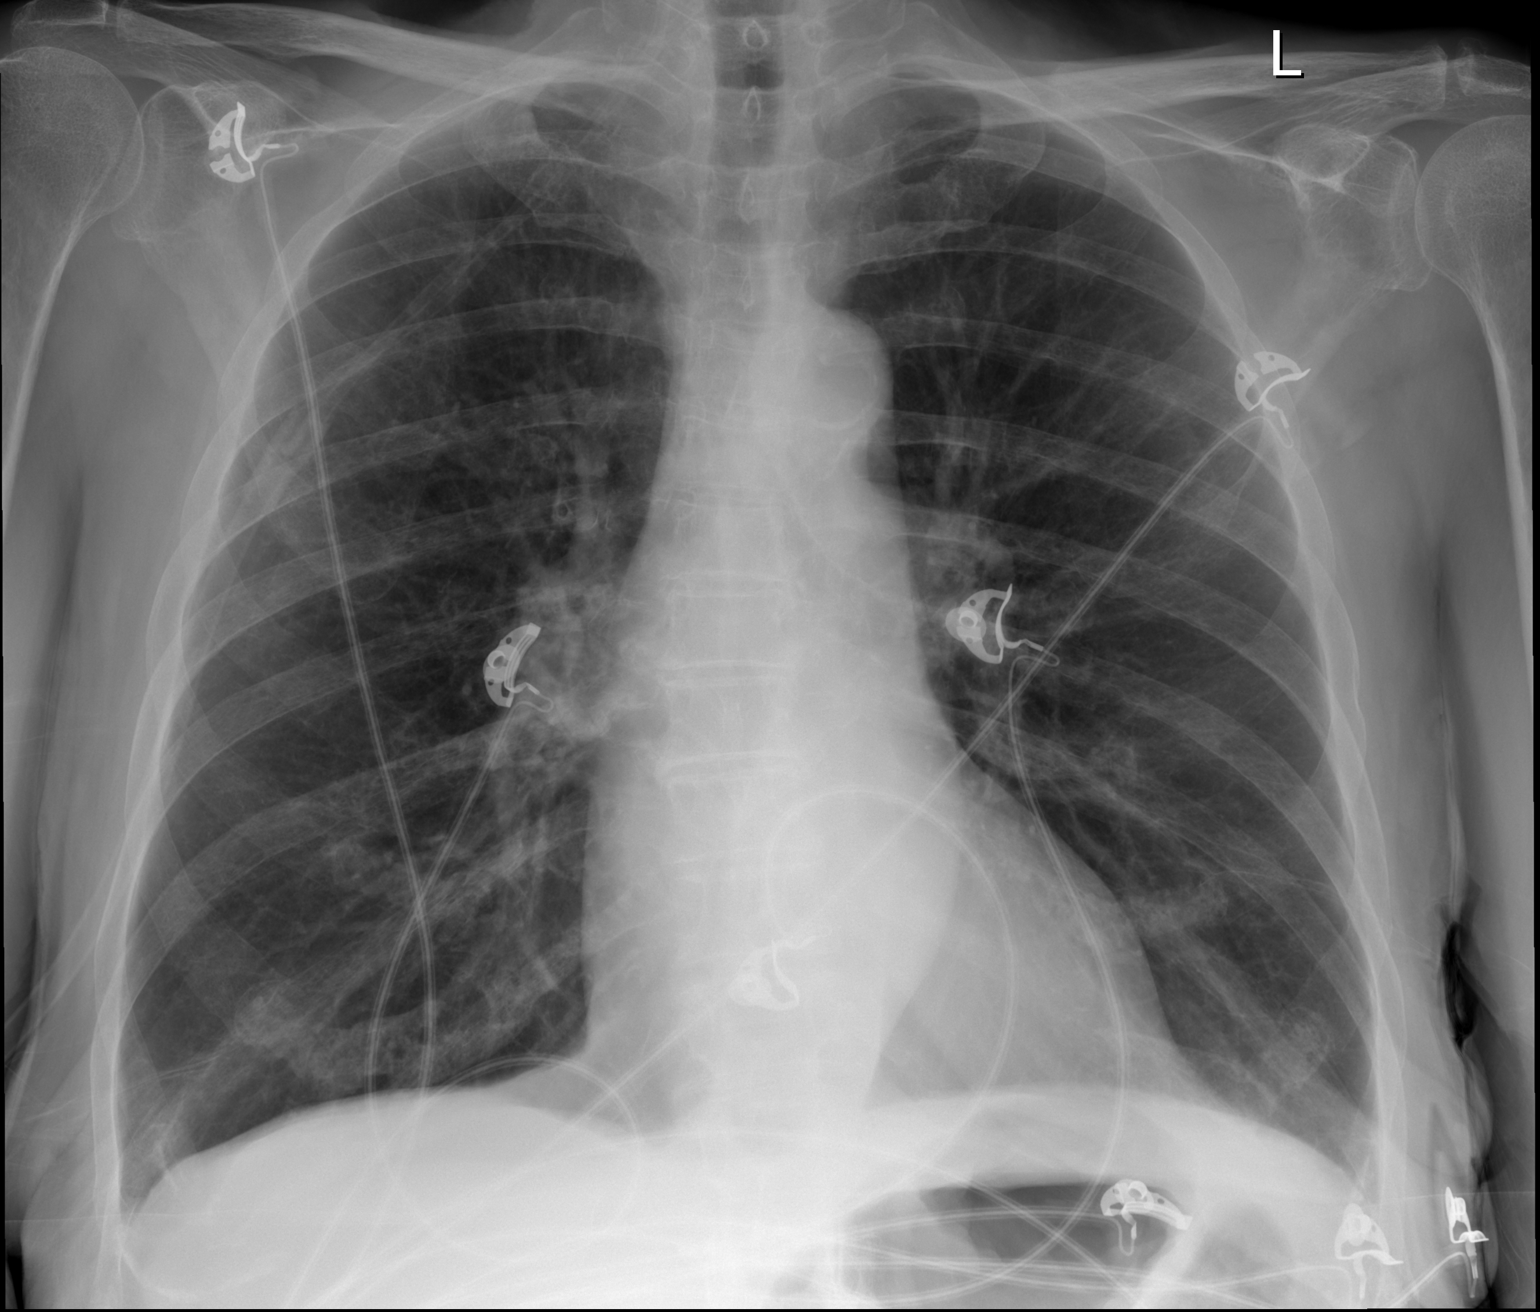

[2 of 2 positions shown; findings below may reference images not displayed]

FINDINGS: Normal heart size and pulmonary vascularity.
Emphysematous changes and scattered fibrosis in the lungs.  No
focal airspace consolidation.  No blunting of costophrenic angles.
No pneumothorax.  Mediastinal contours appear intact.  Calcified
and tortuous aorta. Old left rib fractures.  Degenerative changes
in the spine.  No significant change since previous study.
IMPRESSION: Emphysematous changes.  No evidence of active pulmonary disease.

## 2013-11-19 IMAGING — CT CT HEAD W/O CM
1 of 2 series · 13 of 30 positions shown, 17 images · non-contrast
Comparison: 09/06/2012 and 12/12/2003

CLINICAL DATA: Status post cardiac arrest.  Unresponsive.  Previous
stroke.  Diabetes hypertension.

CT HEAD WITHOUT CONTRAST
TECHNIQUE: Contiguous axial images were obtained from the base of
the skull through the vertex without contrast.

[Series 2: brain · axial · 0.42mm/px · z∈[+126,+255]mm · 13 of 28 slices shown, 17 images]
[im 2/28  brain]
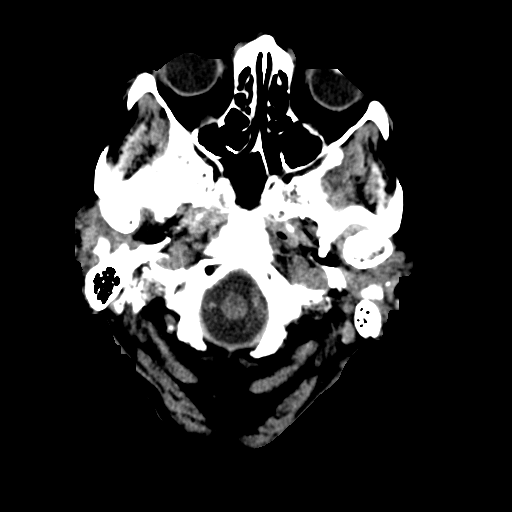
[im 2/28  bone]
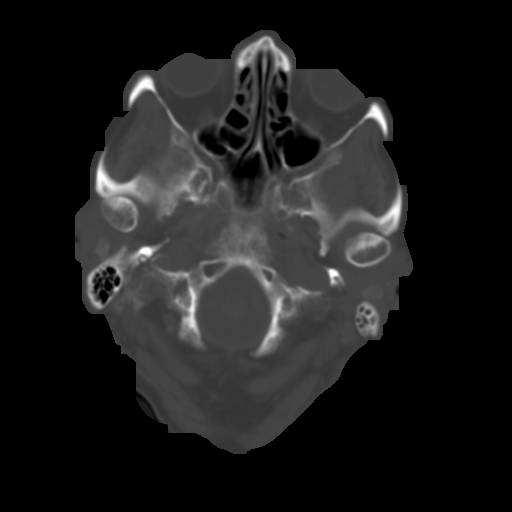
[im 4/28  brain]
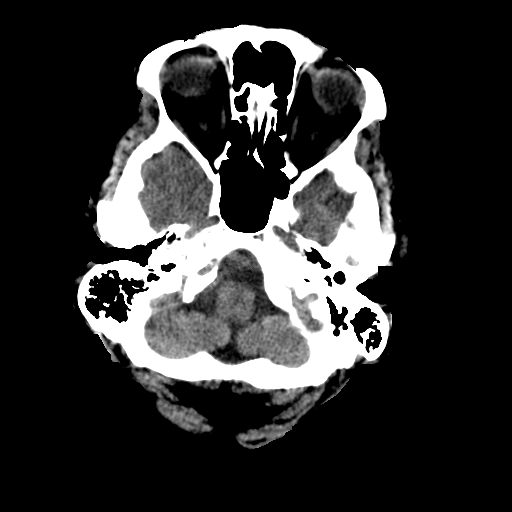
[im 6/28  brain]
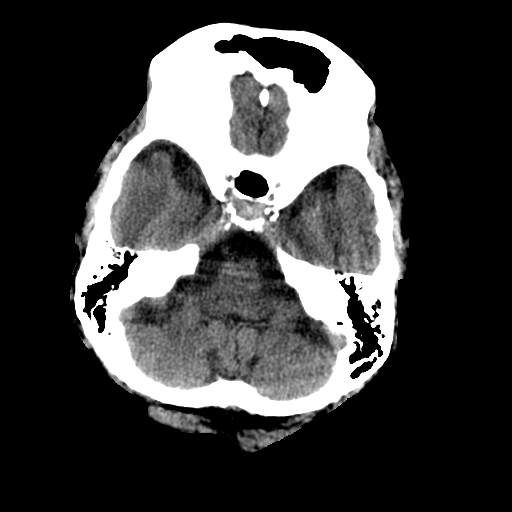
[im 8/28  brain]
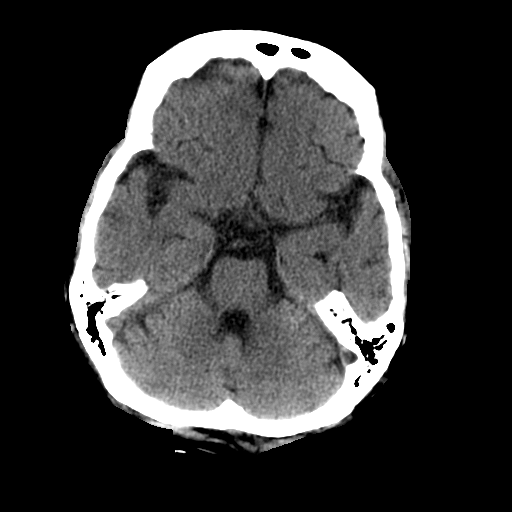
[im 10/28  brain]
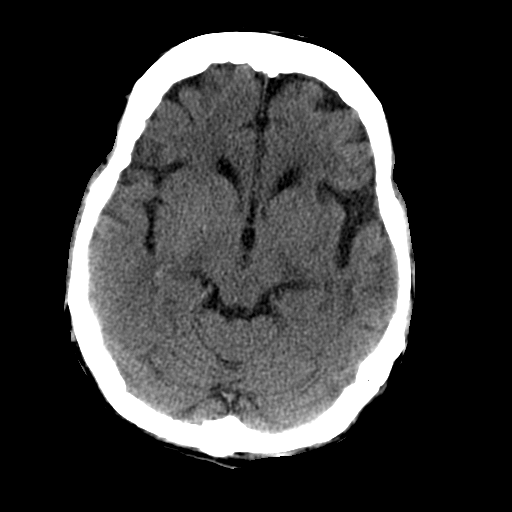
[im 10/28  bone]
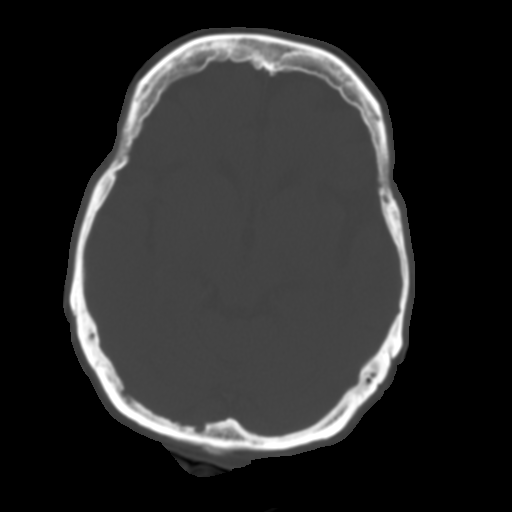
[im 12/28  brain]
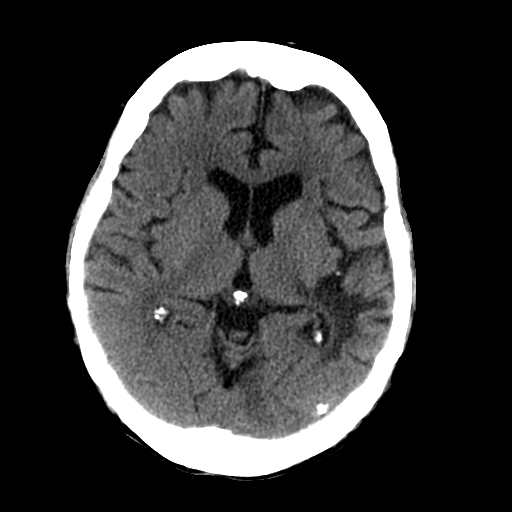
[im 14/28  brain]
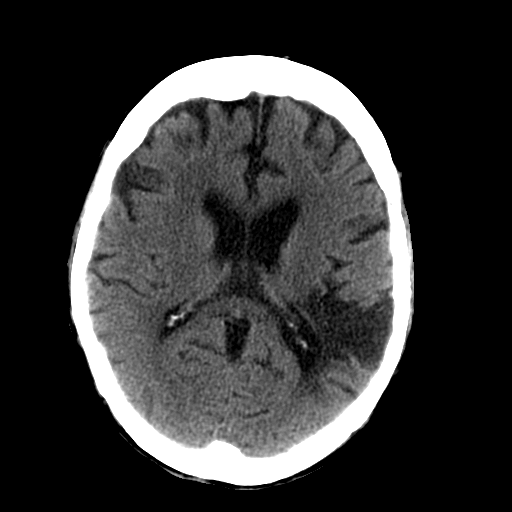
[im 16/28  brain]
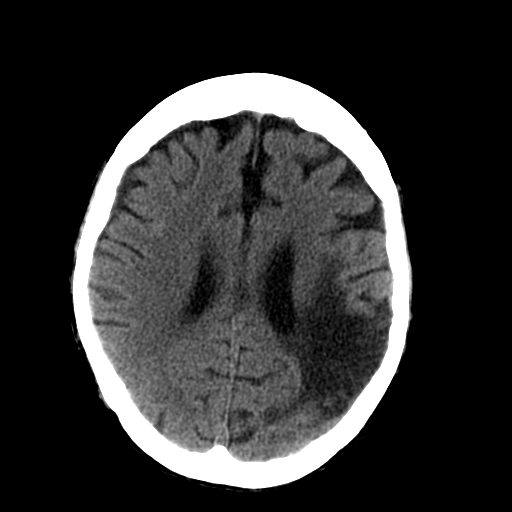
[im 18/28  brain]
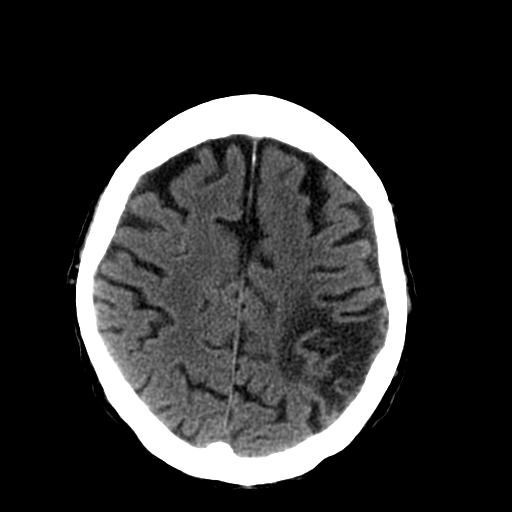
[im 18/28  bone]
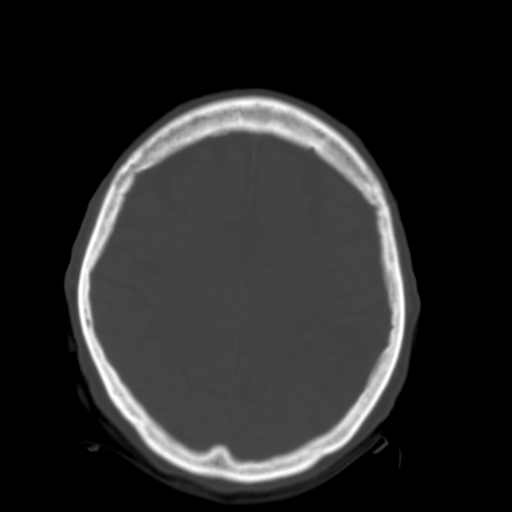
[im 20/28  brain]
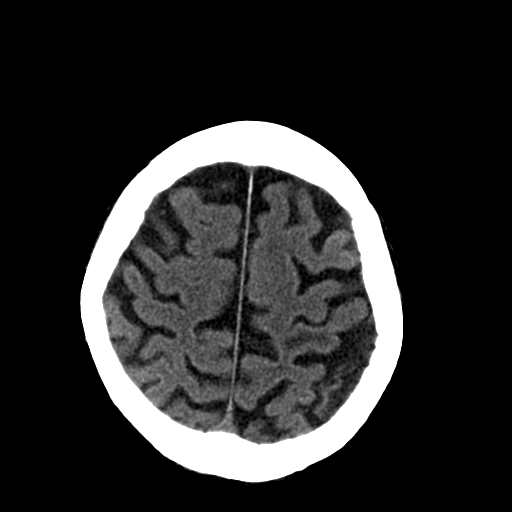
[im 22/28  brain]
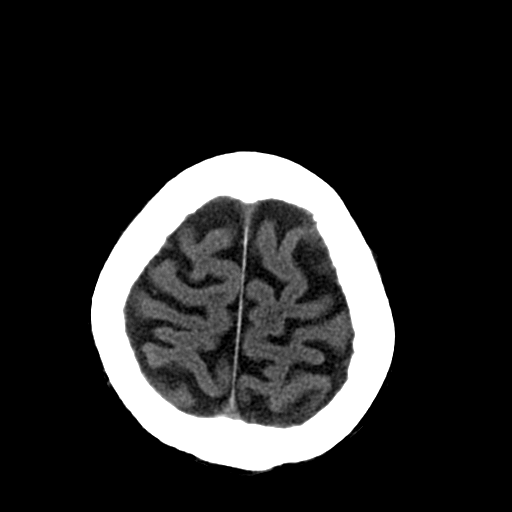
[im 24/28  brain]
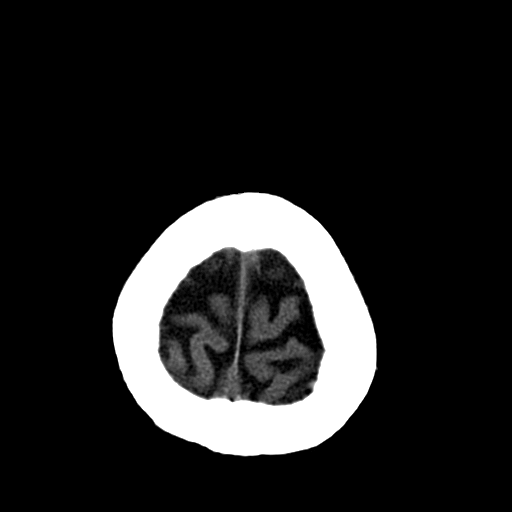
[im 26/28  brain]
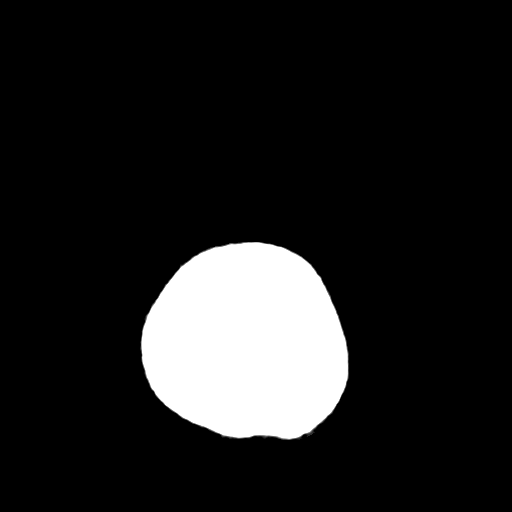
[im 26/28  bone]
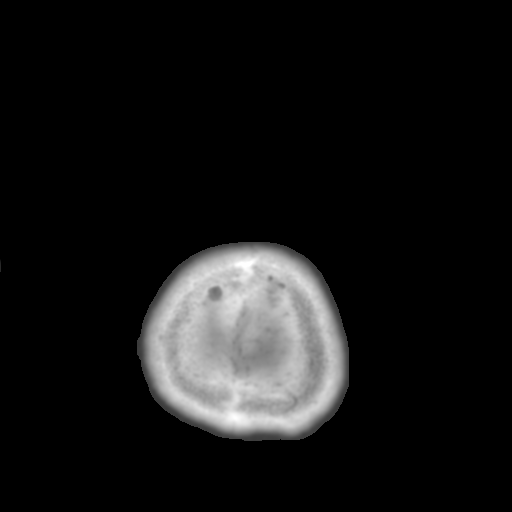

[13 of 30 positions shown; findings below may reference images not displayed]

FINDINGS: There is no evidence of intracranial hemorrhage, brain
edema or other signs of acute infarction.  There is no evidence of
intracranial mass lesion or mass effect.  No abnormal extra-axial
fluid collections are identified.

Old left parietal lobe infarct is unchanged in appearance.  Mild
diffuse cerebral atrophy is also stable.  Ventricles are stable in
size.  No skull abnormality identified.
IMPRESSION: 1.  No acute intracranial abnormality.
2.  Old left parietal infarct.
3.  Mild cerebral atrophy.

## 2014-05-08 ENCOUNTER — Encounter (HOSPITAL_COMMUNITY): Payer: Self-pay | Admitting: Emergency Medicine

## 2014-05-08 ENCOUNTER — Observation Stay (HOSPITAL_COMMUNITY)
Admission: EM | Admit: 2014-05-08 | Discharge: 2014-05-12 | Disposition: A | Discharge status: Home / Self Care | Payer: Medicare Other | Attending: Internal Medicine | Admitting: Internal Medicine

## 2014-05-08 ENCOUNTER — Emergency Department (HOSPITAL_COMMUNITY): Payer: Medicare Other

## 2014-05-08 DIAGNOSIS — R7309 Other abnormal glucose: Secondary | ICD-10-CM

## 2014-05-08 DIAGNOSIS — J449 Chronic obstructive pulmonary disease, unspecified: Secondary | ICD-10-CM | POA: Diagnosis present

## 2014-05-08 DIAGNOSIS — J4489 Other specified chronic obstructive pulmonary disease: Secondary | ICD-10-CM | POA: Insufficient documentation

## 2014-05-08 DIAGNOSIS — F03918 Unspecified dementia, unspecified severity, with other behavioral disturbance: Secondary | ICD-10-CM

## 2014-05-08 DIAGNOSIS — Z79899 Other long term (current) drug therapy: Secondary | ICD-10-CM | POA: Insufficient documentation

## 2014-05-08 DIAGNOSIS — E039 Hypothyroidism, unspecified: Secondary | ICD-10-CM | POA: Insufficient documentation

## 2014-05-08 DIAGNOSIS — F319 Bipolar disorder, unspecified: Secondary | ICD-10-CM | POA: Diagnosis present

## 2014-05-08 DIAGNOSIS — Z91199 Patient's noncompliance with other medical treatment and regimen due to unspecified reason: Secondary | ICD-10-CM | POA: Insufficient documentation

## 2014-05-08 DIAGNOSIS — E785 Hyperlipidemia, unspecified: Secondary | ICD-10-CM | POA: Insufficient documentation

## 2014-05-08 DIAGNOSIS — F0150 Vascular dementia without behavioral disturbance: Secondary | ICD-10-CM | POA: Insufficient documentation

## 2014-05-08 DIAGNOSIS — J9610 Chronic respiratory failure, unspecified whether with hypoxia or hypercapnia: Secondary | ICD-10-CM

## 2014-05-08 DIAGNOSIS — I10 Essential (primary) hypertension: Secondary | ICD-10-CM | POA: Insufficient documentation

## 2014-05-08 DIAGNOSIS — I672 Cerebral atherosclerosis: Secondary | ICD-10-CM | POA: Insufficient documentation

## 2014-05-08 DIAGNOSIS — Z87898 Personal history of other specified conditions: Secondary | ICD-10-CM | POA: Insufficient documentation

## 2014-05-08 DIAGNOSIS — N179 Acute kidney failure, unspecified: Secondary | ICD-10-CM | POA: Insufficient documentation

## 2014-05-08 DIAGNOSIS — Z87891 Personal history of nicotine dependence: Secondary | ICD-10-CM | POA: Insufficient documentation

## 2014-05-08 DIAGNOSIS — Z8673 Personal history of transient ischemic attack (TIA), and cerebral infarction without residual deficits: Secondary | ICD-10-CM | POA: Insufficient documentation

## 2014-05-08 DIAGNOSIS — F3130 Bipolar disorder, current episode depressed, mild or moderate severity, unspecified: Secondary | ICD-10-CM | POA: Insufficient documentation

## 2014-05-08 DIAGNOSIS — E860 Dehydration: Secondary | ICD-10-CM | POA: Insufficient documentation

## 2014-05-08 DIAGNOSIS — D72829 Elevated white blood cell count, unspecified: Secondary | ICD-10-CM | POA: Insufficient documentation

## 2014-05-08 DIAGNOSIS — E119 Type 2 diabetes mellitus without complications: Secondary | ICD-10-CM | POA: Insufficient documentation

## 2014-05-08 DIAGNOSIS — E11649 Type 2 diabetes mellitus with hypoglycemia without coma: Secondary | ICD-10-CM

## 2014-05-08 DIAGNOSIS — D649 Anemia, unspecified: Secondary | ICD-10-CM | POA: Insufficient documentation

## 2014-05-08 DIAGNOSIS — Z9119 Patient's noncompliance with other medical treatment and regimen: Secondary | ICD-10-CM | POA: Insufficient documentation

## 2014-05-08 DIAGNOSIS — K7030 Alcoholic cirrhosis of liver without ascites: Secondary | ICD-10-CM | POA: Insufficient documentation

## 2014-05-08 DIAGNOSIS — R4701 Aphasia: Secondary | ICD-10-CM | POA: Insufficient documentation

## 2014-05-08 DIAGNOSIS — N390 Urinary tract infection, site not specified: Secondary | ICD-10-CM | POA: Insufficient documentation

## 2014-05-08 DIAGNOSIS — R42 Dizziness and giddiness: Secondary | ICD-10-CM | POA: Insufficient documentation

## 2014-05-08 DIAGNOSIS — F0391 Unspecified dementia with behavioral disturbance: Secondary | ICD-10-CM

## 2014-05-08 DIAGNOSIS — I959 Hypotension, unspecified: Principal | ICD-10-CM | POA: Diagnosis present

## 2014-05-08 DIAGNOSIS — R739 Hyperglycemia, unspecified: Secondary | ICD-10-CM | POA: Diagnosis present

## 2014-05-08 DIAGNOSIS — Z9981 Dependence on supplemental oxygen: Secondary | ICD-10-CM | POA: Insufficient documentation

## 2014-05-08 LAB — CBC WITH DIFFERENTIAL/PLATELET
BASOS ABS: 0.1 10*3/uL (ref 0.0–0.1)
BASOS PCT: 0 % (ref 0–1)
Eosinophils Absolute: 0.5 10*3/uL (ref 0.0–0.7)
Eosinophils Relative: 4 % (ref 0–5)
HCT: 38.3 % (ref 36.0–46.0)
Hemoglobin: 12.5 g/dL (ref 12.0–15.0)
Lymphocytes Relative: 42 % (ref 12–46)
Lymphs Abs: 5.3 10*3/uL — ABNORMAL HIGH (ref 0.7–4.0)
MCH: 30.6 pg (ref 26.0–34.0)
MCHC: 32.6 g/dL (ref 30.0–36.0)
MCV: 93.9 fL (ref 78.0–100.0)
Monocytes Absolute: 0.6 10*3/uL (ref 0.1–1.0)
Monocytes Relative: 5 % (ref 3–12)
NEUTROS ABS: 6.1 10*3/uL (ref 1.7–7.7)
NEUTROS PCT: 49 % (ref 43–77)
PLATELETS: 203 10*3/uL (ref 150–400)
RBC: 4.08 MIL/uL (ref 3.87–5.11)
RDW: 12.8 % (ref 11.5–15.5)
WBC: 12.5 10*3/uL — ABNORMAL HIGH (ref 4.0–10.5)

## 2014-05-08 LAB — COMPREHENSIVE METABOLIC PANEL
ALK PHOS: 65 U/L (ref 39–117)
ALT: 19 U/L (ref 0–35)
AST: 18 U/L (ref 0–37)
Albumin: 3.8 g/dL (ref 3.5–5.2)
BUN: 51 mg/dL — ABNORMAL HIGH (ref 6–23)
CO2: 24 meq/L (ref 19–32)
Calcium: 10.2 mg/dL (ref 8.4–10.5)
Chloride: 97 mEq/L (ref 96–112)
Creatinine, Ser: 1.71 mg/dL — ABNORMAL HIGH (ref 0.50–1.10)
GFR calc non Af Amer: 28 mL/min — ABNORMAL LOW (ref >90)
GFR, EST AFRICAN AMERICAN: 33 mL/min — AB (ref >90)
GLUCOSE: 306 mg/dL — AB (ref 70–99)
Potassium: 4.9 mEq/L (ref 3.7–5.3)
SODIUM: 138 meq/L (ref 137–147)
Total Protein: 7.7 g/dL (ref 6.0–8.3)

## 2014-05-08 LAB — CBG MONITORING, ED: Glucose-Capillary: 280 mg/dL — ABNORMAL HIGH (ref 70–99)

## 2014-05-08 LAB — I-STAT TROPONIN, ED: TROPONIN I, POC: 0 ng/mL (ref 0.00–0.08)

## 2014-05-08 LAB — LACTIC ACID, PLASMA: LACTIC ACID, VENOUS: 0.2 mmol/L — AB (ref 0.5–2.2)

## 2014-05-08 MED ORDER — LEVOTHYROXINE SODIUM 125 MCG PO TABS
125.0000 ug | ORAL_TABLET | Freq: Every day | ORAL | Status: DC
  Administered 2014-05-09 – 2014-05-12 (×4): 125 ug via ORAL
  Filled 2014-05-08 (×5): qty 1

## 2014-05-08 MED ORDER — VITAMIN D3 25 MCG (1000 UNIT) PO TABS
4000.0000 [IU] | ORAL_TABLET | Freq: Every morning | ORAL | Status: DC
  Administered 2014-05-09 – 2014-05-12 (×4): 4000 [IU] via ORAL
  Filled 2014-05-08 (×4): qty 4

## 2014-05-08 MED ORDER — INSULIN GLARGINE 100 UNIT/ML ~~LOC~~ SOLN
10.0000 [IU] | Freq: Every day | SUBCUTANEOUS | Status: DC
  Administered 2014-05-09 – 2014-05-10 (×3): 10 [IU] via SUBCUTANEOUS
  Filled 2014-05-08 (×5): qty 0.1

## 2014-05-08 MED ORDER — INSULIN ASPART 100 UNIT/ML ~~LOC~~ SOLN
0.0000 [IU] | SUBCUTANEOUS | Status: DC
Start: 2014-05-09 — End: 2014-05-09
  Administered 2014-05-09: 8 [IU] via SUBCUTANEOUS
  Administered 2014-05-09: 4 [IU] via SUBCUTANEOUS

## 2014-05-08 MED ORDER — INSULIN REGULAR HUMAN 100 UNIT/ML IJ SOLN
INTRAMUSCULAR | Status: DC
  Administered 2014-05-08: 2.2 [IU]/h via INTRAVENOUS
  Filled 2014-05-08: qty 1

## 2014-05-08 MED ORDER — SODIUM CHLORIDE 0.9 % IV SOLN
INTRAVENOUS | Status: DC
  Administered 2014-05-08 – 2014-05-09 (×2): via INTRAVENOUS

## 2014-05-08 MED ORDER — IPRATROPIUM-ALBUTEROL 0.5-2.5 (3) MG/3ML IN SOLN: 3.0000 mL | Freq: Four times a day (QID) | RESPIRATORY_TRACT | Status: DC | PRN

## 2014-05-08 MED ORDER — PANTOPRAZOLE SODIUM 40 MG IV SOLR
40.0000 mg | Freq: Every day | INTRAVENOUS | Status: DC
  Administered 2014-05-09 (×2): 40 mg via INTRAVENOUS
  Filled 2014-05-08 (×4): qty 40

## 2014-05-08 MED ORDER — ENOXAPARIN SODIUM 30 MG/0.3ML ~~LOC~~ SOLN
30.0000 mg | Freq: Every day | SUBCUTANEOUS | Status: DC
  Administered 2014-05-09 – 2014-05-11 (×4): 30 mg via SUBCUTANEOUS
  Filled 2014-05-08 (×6): qty 0.3

## 2014-05-08 MED ORDER — SIMVASTATIN 20 MG PO TABS
20.0000 mg | ORAL_TABLET | Freq: Every evening | ORAL | Status: DC
  Administered 2014-05-09 – 2014-05-11 (×3): 20 mg via ORAL
  Filled 2014-05-08 (×4): qty 1

## 2014-05-08 MED ORDER — SODIUM CHLORIDE 0.9 % IV BOLUS (SEPSIS)
500.0000 mL | Freq: Once | INTRAVENOUS | Status: AC
  Administered 2014-05-08: 500 mL via INTRAVENOUS

## 2014-05-08 MED ORDER — OLANZAPINE 10 MG PO TABS
10.0000 mg | ORAL_TABLET | Freq: Every day | ORAL | Status: DC
  Administered 2014-05-09 – 2014-05-11 (×4): 10 mg via ORAL
  Filled 2014-05-08 (×6): qty 1

## 2014-05-08 MED ORDER — BUDESONIDE 0.5 MG/2ML IN SUSP
0.5000 mg | Freq: Every day | RESPIRATORY_TRACT | Status: DC | PRN
  Filled 2014-05-08: qty 2

## 2014-05-08 NOTE — ED Notes (Signed)
CBG 280 

## 2014-05-08 NOTE — H&P (Signed)
[name] is an 73 y.o. female.   Chief Complaint: sent here by [facility] nurse for hypotension  HPI: [name] is a 73 y.o WF here because she was sent here from her [place] for hypotension. When she arrived here, ED physician states that she didn't have any complaints. She was noted to have low blood pressures in the 70s when she arrived and also had positive orthostatic vital signs changes. During this, she did complain about some dizziness.  She has gotten 2 boluses of Normal saline 521m and this has improved her blood pressure. She was also noted to have elevated WBC but CXR was negative. She also had elevated sugars at 306 initially but after the bolus of fluids, this has come down to 280.   When the patient was asked regarding her diabetes, she says she takes her Januvia but haven't been injecting her lantus at bedtime like she should. She says she hasn't been doing this for weeks. She says it's because too much is going on.  She is with a friend who is also in the room with her and he is unsure what she takes. [facility]   Past Medical History  Diagnosis Date  . Hypertension   . COPD (chronic obstructive pulmonary disease)   . Diabetes mellitus   . Elevated cholesterol   . History of ETOH abuse   . Hypothyroid   . Cirrhosis, alcoholic   . Psychosis   . Lymphoma     T cell  . Stroke     Occipital 2004  . Asthma     History reviewed. No pertinent past surgical history.  History reviewed. No pertinent family history. Social History:  reports that she quit smoking about a year ago. Her smoking use included Cigarettes. She has a 28 pack-year smoking history. She has never used smokeless tobacco. She reports that she does not drink alcohol or use illicit drugs.  Allergies:  Allergies  Allergen Reactions  . Bee Venom Anaphylaxis     (Not in a hospital admission)  Results for orders placed during the hospital encounter of  05/08/14 (from the past 48 hour(s))  COMPREHENSIVE METABOLIC PANEL     Status: Abnormal   Collection Time    05/08/14  7:00 PM      Result Value Ref Range   Sodium 138  137 - 147 mEq/L   Potassium 4.9  3.7 - 5.3 mEq/L   Chloride 97  96 - 112 mEq/L   CO2 24  19 - 32 mEq/L   Glucose, Bld 306 (*) 70 - 99 mg/dL   BUN 51 (*) 6 - 23 mg/dL   Creatinine, Ser 1.71 (*) 0.50 - 1.10 mg/dL   Calcium 10.2  8.4 - 10.5 mg/dL   Total Protein 7.7  6.0 - 8.3 g/dL   Albumin 3.8  3.5 - 5.2 g/dL   AST 18  0 - 37 U/L   ALT 19  0 - 35 U/L   Alkaline Phosphatase 65  39 - 117 U/L   Total Bilirubin <0.2 (*) 0.3 - 1.2 mg/dL   GFR calc non Af Amer 28 (*) >90 mL/min   GFR calc Af Amer 33 (*) >90 mL/min   Comment: (NOTE)     The eGFR has been calculated using the CKD EPI equation.     This calculation has not been validated in all clinical situations.     eGFR's persistently <  90 mL/min signify possible Chronic Kidney     Disease.  CBC WITH DIFFERENTIAL     Status: Abnormal   Collection Time    05/08/14  7:00 PM      Result Value Ref Range   WBC 12.5 (*) 4.0 - 10.5 K/uL   RBC 4.08  3.87 - 5.11 MIL/uL   Hemoglobin 12.5  12.0 - 15.0 g/dL   HCT 38.3  36.0 - 46.0 %   MCV 93.9  78.0 - 100.0 fL   MCH 30.6  26.0 - 34.0 pg   MCHC 32.6  30.0 - 36.0 g/dL   RDW 12.8  11.5 - 15.5 %   Platelets 203  150 - 400 K/uL   Neutrophils Relative % 49  43 - 77 %   Neutro Abs 6.1  1.7 - 7.7 K/uL   Lymphocytes Relative 42  12 - 46 %   Lymphs Abs 5.3 (*) 0.7 - 4.0 K/uL   Monocytes Relative 5  3 - 12 %   Monocytes Absolute 0.6  0.1 - 1.0 K/uL   Eosinophils Relative 4  0 - 5 %   Eosinophils Absolute 0.5  0.0 - 0.7 K/uL   Basophils Relative 0  0 - 1 %   Basophils Absolute 0.1  0.0 - 0.1 K/uL  LACTIC ACID, PLASMA     Status: Abnormal   Collection Time    05/08/14  7:00 PM      Result Value Ref Range   Lactic Acid, Venous 0.2 (*) 0.5 - 2.2 mmol/L  I-STAT TROPOININ, ED     Status: None   Collection Time    05/08/14  7:19 PM       Result Value Ref Range   Troponin i, poc 0.00  0.00 - 0.08 ng/mL   Comment 3            Comment: Due to the release kinetics of cTnI,     a negative result within the first hours     of the onset of symptoms does not rule out     myocardial infarction with certainty.     If myocardial infarction is still suspected,     repeat the test at appropriate intervals.  CBG MONITORING, ED     Status: Abnormal   Collection Time    05/08/14  8:55 PM      Result Value Ref Range   Glucose-Capillary 280 (*) 70 - 99 mg/dL   Dg Chest Port 1 View  05/08/2014   CLINICAL DATA:  Shortness of breath and cough  EXAM: PORTABLE CHEST - 1 VIEW  COMPARISON:  None  FINDINGS: Cardiac shadow is within normal limits. The lungs are well aerated bilaterally. No focal infiltrate or sizable effusion is seen. No bony abnormality is noted.  IMPRESSION: No acute abnormality noted.   Electronically Signed   By: Inez Catalina M.D.   On: 05/08/2014 20:59    Review of Systems  Constitutional: Negative for fever, chills, weight loss, malaise/fatigue and diaphoresis.  HENT: Negative for ear discharge and tinnitus.   Eyes: Negative for blurred vision.  Respiratory: Negative for cough, sputum production, shortness of breath and wheezing.   Cardiovascular: Negative for chest pain, palpitations and leg swelling.  Gastrointestinal: Negative for heartburn, nausea, vomiting, abdominal pain, constipation, blood in stool and melena.  Genitourinary: Negative for dysuria, urgency, frequency and hematuria.  Musculoskeletal: Negative for back pain, myalgias and neck pain.  Skin: Negative for itching and rash.  Neurological: Negative for dizziness,  tingling, sensory change, focal weakness, weakness and headaches.  Endo/Heme/Allergies: Negative for environmental allergies and polydipsia.  Psychiatric/Behavioral: Negative for depression, suicidal ideas and substance abuse. The patient is not nervous/anxious.     Blood pressure 106/54,  pulse 65, temperature 98 F (36.7 C), temperature source Oral, resp. rate 30, SpO2 100.00%. Physical Exam  Constitutional: She is oriented to person, place, and time. She appears well-developed and well-nourished.  HENT:  Head: Normocephalic and atraumatic.  Right Ear: External ear normal.  Left Ear: External ear normal.  Nose: Nose normal.  Mouth/Throat: Oropharynx is clear and moist.  Mucous membranes moist  Eyes: Conjunctivae are normal. Pupils are equal, round, and reactive to light.  Neck: Normal range of motion. Neck supple.  Cardiovascular: Regular rhythm and normal heart sounds.   bradycardia  Respiratory: Breath sounds normal. No respiratory distress. She has no wheezes.  GI: Soft. Bowel sounds are normal. She exhibits no distension. There is no tenderness. There is no rebound.  Neurological: She is alert and oriented to person, place, and time.  Skin: Skin is warm and dry.  Psychiatric: She has a normal mood and affect. Her behavior is normal.     Assessment/Plan Diagnosis  Hyperglycemia  -her glucose has responded after the 1 liter of fluids. Will begin IVF's at 174m/hr and hold off on the insulin infusion for now since her sugar has responded.  I have added serum ketones to her blood work and her CO2 is normal, so may correct without infusion. She is suppose to be on Lantus 64 units but have decreased the dose down to 10 units initially along with a sliding scale.  Will repeat BMP at 1 am and to get sugar checks every 2 hours.  If glucose doesn't respond, will change to insulin infusion. Electrolytes are normal. To follow up on serum ketones and alcohol level.   Hypotension  -responding well to IVFs. To continue with cardiac monitoring.   Leukocytosis  -may be secondary to hyperglycemia. CXR is negative and have added urine with urine culture.   AKI (acute kidney injury)  -monitoring with BMP's and likely secondary to hyperglycemia. Will continue IVF's.    Dehydration  -to continue IVF's and monitor BUN/Creatinine. Have given diet as well.   COPD (chronic obstructive pulmonary disease)  -continue nebs and oxygen BNC. She has reported being on 3-4 Liters all the time but she is at 100% on 2 liters.    HYPOTHYROIDISM -contiunue her synthroid and have added TSH and T4 to her labs.   DIABETES MELLITUS, TYPE II -decrease lantus and placed on sliding scale with IVF's for now. D/C her metformin and her Januvia.   DYSLIPIDEMIA  -continued her zocor.   BIPOLAR AFFECTIVE DISORDER  -continued her zyprexa.   CIRRHOSIS, ALCOHOLIC  -getting alcohol blood level.    Code Status: Full  ASandi Mealy5/14/2015, 9:46 PM

## 2014-05-08 NOTE — ED Notes (Signed)
Began infusing insulin, MD at bedside wanted it stopped. Disconnected pt. From insulin. MD sts "Pt's sugar is going down with just fluids."

## 2014-05-08 NOTE — ED Notes (Signed)
Pt lives at home alone and is in charge of taking home medications. Pt sts she has not been taking her diabetic medications for the past 3-4 weeks. Pt has home health nurse that assists her to some capacity but is not there all the time. Pt has "friend" at bedside who sts he used to live with her but was "run out" by pt. Pt does not use any devices to walk.

## 2014-05-08 NOTE — ED Notes (Signed)
Social work at bedside.  

## 2014-05-08 NOTE — Progress Notes (Signed)
  CARE MANAGEMENT ED NOTE 05/08/2014  Patient:  [name], [name]   Account Number:  [id number]  Date Initiated:  05/08/2014  Documentation initiated by:  Livia Snellen  Subjective/Objective Assessment:   Patient presents to Ed with hypotension and not taking her diabetic medications in 3-4 weeks.     Subjective/Objective Assessment Detail:   Patient with pmhx of HTN, COPD, DM, hypothyroid, alcohol abuse, stoke and asthma.     Action/Plan:   Action/Plan Detail:   Anticipated DC Date:       Status Recommendation to Physician:   Result of Recommendation:    Other ED [place]  Other  PCP issues    Choice offered to / List presented to:            Status of service:  Completed, signed off  ED Comments:   ED Comments Detail:  EDCM spoke to patient at bedside with social worker. Patient's friend Shanon Brow at bedside as well.  EDCM found it difficult to speak to patient as her friend was speaking over case manager and answering for patient.  Patient reports she lives alone.  Patient wears oxygen at home supplied by [facility].  Patient has an oxygen concentrator at home, portable O2 tank and nebulizer machine at home.  Patient reports she is able to complete her ADL's on her own, "It's just taking me a little longer," due to weakness.  Patient reports she no longer sees Dr. Rachell Cipro, because she has left the practice, patient is now seen by Dr. Lessie Dings.  System updated.  Patient reports see is currently being seen by [place] for a visiting RN only.  Patient and patient's friend both feel patient would benefit from home health aide.  EDSW kindly asked patient's friend to leave the room.  Patient's friend stated, "I'm her POA!!!  I can sit right here for as long as I want!!"  EDSW unable to locate POA paperwork in which patient's friend reports was given to the nurse. EDCM provide patient with a list of home health agencies in  [facility], list of private duty nursing services , information regarding the [employer], and printed information regarding the the [facility].  EDCM explained to patient that with home health she may receive a visiting RN, PT, OT anid and social worker if needed.  EDCM also explained that private duty nursing services may be an out ofpocket expense for the patient.  Patient needs reinforcement reagrding resources given to her by [facility] as patient's friend spoke over [facility] throughout entire conversation. EDCM placed resources in belongings bag to send with patient upstairs.  Patient thankful for resources.  No further EDCM needs at this time.

## 2014-05-08 NOTE — ED Notes (Signed)
Attempted to call report, RN unavailable. Floor RN to call back 
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
## 2014-05-08 NOTE — ED Notes (Addendum)
Pt presents after being told by home health that her blood pressure is low.  Denies dizziness or lightheadedness.  Denies pain.  Pt and son could not tell me the reading.          Son reports Pt is normally on "3 or 4L"  home O2.

## 2014-05-09 DIAGNOSIS — N179 Acute kidney failure, unspecified: Secondary | ICD-10-CM

## 2014-05-09 DIAGNOSIS — I959 Hypotension, unspecified: Principal | ICD-10-CM

## 2014-05-09 DIAGNOSIS — E119 Type 2 diabetes mellitus without complications: Secondary | ICD-10-CM

## 2014-05-09 DIAGNOSIS — E860 Dehydration: Secondary | ICD-10-CM

## 2014-05-09 LAB — HEMOGLOBIN A1C
Hgb A1c MFr Bld: 6.8 % — ABNORMAL HIGH (ref <5.7)
MEAN PLASMA GLUCOSE: 148 mg/dL — AB (ref <117)

## 2014-05-09 LAB — GLUCOSE, CAPILLARY
GLUCOSE-CAPILLARY: 234 mg/dL — AB (ref 70–99)
GLUCOSE-CAPILLARY: 71 mg/dL (ref 70–99)
Glucose-Capillary: 111 mg/dL — ABNORMAL HIGH (ref 70–99)
Glucose-Capillary: 163 mg/dL — ABNORMAL HIGH (ref 70–99)
Glucose-Capillary: 71 mg/dL (ref 70–99)
Glucose-Capillary: 84 mg/dL (ref 70–99)

## 2014-05-09 LAB — CBC
HEMATOCRIT: 31.7 % — AB (ref 36.0–46.0)
Hemoglobin: 10.3 g/dL — ABNORMAL LOW (ref 12.0–15.0)
MCH: 30.7 pg (ref 26.0–34.0)
MCHC: 32.5 g/dL (ref 30.0–36.0)
MCV: 94.3 fL (ref 78.0–100.0)
Platelets: 172 10*3/uL (ref 150–400)
RBC: 3.36 MIL/uL — AB (ref 3.87–5.11)
RDW: 12.8 % (ref 11.5–15.5)
WBC: 10.4 10*3/uL (ref 4.0–10.5)

## 2014-05-09 LAB — URINALYSIS, ROUTINE W REFLEX MICROSCOPIC
Bilirubin Urine: NEGATIVE
Glucose, UA: 250 mg/dL — AB
Hgb urine dipstick: NEGATIVE
Ketones, ur: NEGATIVE mg/dL
Nitrite: NEGATIVE
PROTEIN: NEGATIVE mg/dL
Specific Gravity, Urine: 1.011 (ref 1.005–1.030)
UROBILINOGEN UA: 0.2 mg/dL (ref 0.0–1.0)
pH: 5 (ref 5.0–8.0)

## 2014-05-09 LAB — T4, FREE: Free T4: 1.11 ng/dL (ref 0.80–1.80)

## 2014-05-09 LAB — URINE MICROSCOPIC-ADD ON

## 2014-05-09 LAB — BASIC METABOLIC PANEL
BUN: 47 mg/dL — ABNORMAL HIGH (ref 6–23)
CHLORIDE: 102 meq/L (ref 96–112)
CO2: 25 mEq/L (ref 19–32)
CREATININE: 1.54 mg/dL — AB (ref 0.50–1.10)
Calcium: 9.1 mg/dL (ref 8.4–10.5)
GFR calc Af Amer: 37 mL/min — ABNORMAL LOW (ref >90)
GFR calc non Af Amer: 32 mL/min — ABNORMAL LOW (ref >90)
Glucose, Bld: 178 mg/dL — ABNORMAL HIGH (ref 70–99)
Potassium: 4.1 mEq/L (ref 3.7–5.3)
SODIUM: 138 meq/L (ref 137–147)

## 2014-05-09 LAB — KETONES, QUALITATIVE: Acetone, Bld: NEGATIVE

## 2014-05-09 LAB — TSH: TSH: 5.6 u[IU]/mL — ABNORMAL HIGH (ref 0.350–4.500)

## 2014-05-09 MED ORDER — ACETAMINOPHEN 325 MG PO TABS: 650.0000 mg | ORAL_TABLET | Freq: Four times a day (QID) | ORAL | Status: DC | PRN

## 2014-05-09 MED ORDER — INSULIN ASPART 100 UNIT/ML ~~LOC~~ SOLN: 0.0000 [IU] | Freq: Every day | SUBCUTANEOUS | Status: DC

## 2014-05-09 MED ORDER — SODIUM CHLORIDE 0.9 % IV SOLN
INTRAVENOUS | Status: AC
  Administered 2014-05-09 – 2014-05-10 (×2): via INTRAVENOUS

## 2014-05-09 MED ORDER — INSULIN ASPART 100 UNIT/ML ~~LOC~~ SOLN
0.0000 [IU] | Freq: Three times a day (TID) | SUBCUTANEOUS | Status: DC
  Administered 2014-05-10 (×2): 1 [IU] via SUBCUTANEOUS
  Administered 2014-05-11: 2 [IU] via SUBCUTANEOUS
  Administered 2014-05-12: 3 [IU] via SUBCUTANEOUS

## 2014-05-09 NOTE — Progress Notes (Signed)
Nutrition Brief Note  Patient identified on the Malnutrition Screening Tool (MST) Report  Wt Readings from Last 15 Encounters:  05/08/14 122 lb 12.7 oz (55.7 kg)  10/11/12 162 lb 0.6 oz (73.5 kg)  09/01/12 128 lb 14.4 oz (58.469 kg)  04/23/12 135 lb (61.236 kg)  07/25/08 161 lb 4 oz (73.143 kg)  08/07/07 162 lb (73.483 kg)  04/30/07 184 lb 1.6 oz (83.507 kg)    Body mass index is 21.07 kg/(m^2). Patient meets criteria for Normal weight based on current BMI.   Current diet order is HH/Carb Modi, patient is consuming approximately >75% of meals at this time. Labs and medications reviewed.   Patient denied any changes in weight or appetite pta. Denied any nausea/vomiting/abd pain post meals. Has hx of noncompliance with DM medication. Declined DM2 diet education  No nutrition interventions warranted at this time. If nutrition issues arise, please consult RD.   Atlee Abide MS RD LDN Clinical Dietitian [phone]

## 2014-05-09 NOTE — Progress Notes (Signed)
PROGRESS NOTE    [name]:[id number] DOB: [birth year] DOA: 05/08/2014 PCP: [name], MD  HPI/Brief narrative 73 year old female with history of HTN, COPD, chronic respiratory failure on home oxygen 2 L per minute, DM 2, hypercholesterolemia, hypothyroid, alcoholic cirrhosis, prior stroke, former smoker and alcohol abuse, presented to the ED on 05/08/14 after being seen by home health nurse for hypotension. In the ED, patient was hypotensive in the 70s, positive orthostatic blood pressures, and elevated WBC, negative chest x-ray, blood sugars 306. Patient has been noncompliant with her Lantus for unclear reasons.   Assessment/Plan:  1. Symptomatic hypotension: Most likely secondary to dehydration. Improved after IV fluids but still soft. Monitor. 2. Dehydration: Probably secondary to poor oral intake. Patient denies nausea, vomiting, diarrhea or fevers. Claims to have a good appetite. IV fluids. 3. Acute renal failure: Most likely secondary to intravascular volume depletion. Creatinine in October 2013 was 0.57. Improving. Continue IV fluid hydration and trend daily BMP. 4. Poorly controlled type II DM: Secondary to noncompliance. Improved. Continue Lantus and SSI. Titrate insulins as needed. 5. Anemia: Probably at baseline. Initial hemoglobin was spuriously elevated secondary to dehydration. Follow CBC in a.m. 6. Chronic respiratory failure/COPD/former smoker: Stable. 7. History of hypertension: Management as above. 8. History of alcoholic cirrhosis/former alcohol use 9. Abnormal TSH: Recommend repeating in 4-6 weeks. Clinically euthyroid.   Code Status: Full Family Communication: Discussed with patient. No family at bedside. Disposition Plan: To be determined? Home when medically stable.   Consultants:  None  Procedures:  None  Antibiotics:  None   Subjective: States that she feels better. Denies dizziness. Denies dyspnea, cough or pain  issues.  Objective: Filed Vitals:   05/08/14 2215 05/08/14 2250 05/09/14 0540 05/09/14 0637  BP: 90/66 103/71 91/59 92/62   Pulse: 64 62 61   Temp:  97.7 F (36.5 C) 97.5 F (36.4 C)   TempSrc:  Oral Oral   Resp: 12 16 23    Height:  5\' 4"  (1.626 m)    Weight:  55.7 kg (122 lb 12.7 oz)    SpO2: 98% 99% 100%     Intake/Output Summary (Last 24 hours) at 05/09/14 1240 Last data filed at 05/09/14 1003  Gross per 24 hour  Intake    778 ml  Output      1 ml  Net    777 ml   Filed Weights   05/08/14 2250  Weight: 55.7 kg (122 lb 12.7 oz)     Exam:  General exam: Moderately built and frail elderly female, pleasant and comfortable lying supine in bed. Respiratory system: Clear. No increased work of breathing. Cardiovascular system: S1 & S2 heard, RRR. No JVD, murmurs, gallops, clicks or pedal edema. Gastrointestinal system: Abdomen is nondistended, soft and nontender. Normal bowel sounds heard. Central nervous system: Alert and oriented to person, place and partly to time. No focal neurological deficits. Extremities: Symmetric 5 x 5 power.   Data Reviewed: Basic Metabolic Panel:  Recent Labs Lab 05/08/14 1900 05/09/14 0120  NA 138 138  K 4.9 4.1  CL 97 102  CO2 24 25  GLUCOSE 306* 178*  BUN 51* 47*  CREATININE 1.71* 1.54*  CALCIUM 10.2 9.1   Liver Function Tests:  Recent Labs Lab 05/08/14 1900  AST 18  ALT 19  ALKPHOS 65  BILITOT <0.2*  PROT 7.7  ALBUMIN 3.8   No results found for this basename: LIPASE, AMYLASE,  in the last 168 hours No results found for this basename: AMMONIA,  in the last 168 hours CBC:  Recent Labs Lab 05/08/14 1900 05/09/14 0120  WBC 12.5* 10.4  NEUTROABS 6.1  --   HGB 12.5 10.3*  HCT 38.3 31.7*  MCV 93.9 94.3  PLT 203 172   Cardiac Enzymes: No results found for this basename: CKTOTAL, CKMB, CKMBINDEX, TROPONINI,  in the last 168 hours BNP (last 3 results) No results found for this basename: PROBNP,  in the last 8760  hours CBG:  Recent Labs Lab 05/08/14 2055 05/09/14 0012 05/09/14 0411 05/09/14 0751 05/09/14 1126  GLUCAP 280* 163* 71 84 234*    No results found for this or any previous visit (from the past 240 hour(s)).    Additional labs: 1. TSH: 5.6, free T4-1.11 2. Blood acetone: Negative     Studies: Dg Chest Port 1 View  05/08/2014   CLINICAL DATA:  Shortness of breath and cough  EXAM: PORTABLE CHEST - 1 VIEW  COMPARISON:  None  FINDINGS: Cardiac shadow is within normal limits. The lungs are well aerated bilaterally. No focal infiltrate or sizable effusion is seen. No bony abnormality is noted.  IMPRESSION: No acute abnormality noted.   Electronically Signed   By: Inez Catalina M.D.   On: 05/08/2014 20:59        Scheduled Meds: . cholecalciferol  4,000 Units Oral q morning - 10a  . enoxaparin (LOVENOX) injection  30 mg Subcutaneous QHS  . insulin aspart  0-24 Units Subcutaneous 6 times per day  . insulin glargine  10 Units Subcutaneous QHS  . levothyroxine  125 mcg Oral QAC breakfast  . OLANZapine  10 mg Oral QHS  . pantoprazole (PROTONIX) IV  40 mg Intravenous QHS  . simvastatin  20 mg Oral QPM   Continuous Infusions: . sodium chloride 125 mL/hr at 05/09/14 0808  . insulin (NOVOLIN-R) infusion Stopped (05/08/14 2108)    Active Problems:   HYPOTHYROIDISM   DIABETES MELLITUS, TYPE II   DYSLIPIDEMIA   BIPOLAR AFFECTIVE DISORDER   COPD (chronic obstructive pulmonary disease)   Hypotension   Hyperglycemia   Leukocytosis   AKI (acute kidney injury)   Dehydration    Time spent: 40 minutes    Modena Jansky, MD, FACP, [facility]. [facility] Pager [phone]  If 7PM-7AM, please contact night-coverage [email or web address] Password [email or web address] 05/09/2014, 12:40 PM    LOS: 1 day

## 2014-05-09 NOTE — Care Management Note (Unsigned)
    Page 1 of 2   05/12/2014     2:51:40 PM CARE MANAGEMENT NOTE 05/12/2014  Patient:  [name], [name]   Account Number:  [id number]  Date Initiated:  05/09/2014  Documentation initiated by:  [facility]  Subjective/Objective Assessment:   73 year old female admitted with hypotension.     Action/Plan:   From home, has [place] services throught [facility]. PT/OT consult ordered to help determine d/c needs.   Anticipated DC Date:  05/12/2014   Anticipated DC Plan:  [place]  In-house referral  Clinical Social Worker      DC Planning Services  CM consult      Choice offered to / List presented to:  C-2 HC POA / Guardian        HH arranged  HH-1 RN  [place]      [place] agency  [place]   Status of service:  In process, will continue to follow Medicare Important Message given?   (If response is "NO", the following Medicare IM given date fields will be blank) Date Medicare IM given:   Date Additional Medicare IM given:    Discharge Disposition:    Per UR Regulation:  Reviewed for med. necessity/level of care/duration of stay  If discussed at [place] of Stay Meetings, dates discussed:    Comments:  05/12/14 Judith Dillon RN BSN [phone] PT recommeding SNF for pt but she is OBS status and she cannot private pay for stay at SNF. Pt gave me permission to speak with [name] her POA about d/c plans. [name] wants to take pt home and care for her with [facility] providing [facility] services. He has to call pt's landlord and make sure it is ok for him to move in with her. [name] has been made aware of need for [facility] services and they are willing to accept pt back. [name] asked me to check with him later in regards to the outcome of his conversation with pt's landlord.

## 2014-05-09 NOTE — Progress Notes (Signed)
UR completed 

## 2014-05-09 NOTE — Progress Notes (Signed)
Pt. Arrived to floor via stretcher. Pt A&Ox2 confused as to time. Bp on arrival 103/71. Pt. Voiced no c/o dizziness. Will maintain pt. On bedrest as ordered and continue to monitor.

## 2014-05-10 DIAGNOSIS — J9610 Chronic respiratory failure, unspecified whether with hypoxia or hypercapnia: Secondary | ICD-10-CM

## 2014-05-10 DIAGNOSIS — D72829 Elevated white blood cell count, unspecified: Secondary | ICD-10-CM

## 2014-05-10 DIAGNOSIS — N390 Urinary tract infection, site not specified: Secondary | ICD-10-CM

## 2014-05-10 LAB — BASIC METABOLIC PANEL
BUN: 34 mg/dL — ABNORMAL HIGH (ref 6–23)
CALCIUM: 8.8 mg/dL (ref 8.4–10.5)
CO2: 23 mEq/L (ref 19–32)
CREATININE: 1.57 mg/dL — AB (ref 0.50–1.10)
Chloride: 107 mEq/L (ref 96–112)
GFR calc Af Amer: 37 mL/min — ABNORMAL LOW (ref >90)
GFR calc non Af Amer: 32 mL/min — ABNORMAL LOW (ref >90)
GLUCOSE: 116 mg/dL — AB (ref 70–99)
POTASSIUM: 4.9 meq/L (ref 3.7–5.3)
SODIUM: 142 meq/L (ref 137–147)

## 2014-05-10 LAB — GLUCOSE, CAPILLARY
GLUCOSE-CAPILLARY: 135 mg/dL — AB (ref 70–99)
GLUCOSE-CAPILLARY: 119 mg/dL — AB (ref 70–99)
Glucose-Capillary: 144 mg/dL — ABNORMAL HIGH (ref 70–99)

## 2014-05-10 LAB — CBC
HCT: 39.7 % (ref 36.0–46.0)
Hemoglobin: 12.6 g/dL (ref 12.0–15.0)
MCH: 30.7 pg (ref 26.0–34.0)
MCHC: 31.7 g/dL (ref 30.0–36.0)
MCV: 96.8 fL (ref 78.0–100.0)
PLATELETS: 180 10*3/uL (ref 150–400)
RBC: 4.1 MIL/uL (ref 3.87–5.11)
RDW: 13.1 % (ref 11.5–15.5)
WBC: 15.4 10*3/uL — ABNORMAL HIGH (ref 4.0–10.5)

## 2014-05-10 MED ORDER — PANTOPRAZOLE SODIUM 40 MG PO TBEC
40.0000 mg | DELAYED_RELEASE_TABLET | Freq: Every day | ORAL | Status: DC
  Administered 2014-05-10 – 2014-05-11 (×2): 40 mg via ORAL
  Filled 2014-05-10 (×3): qty 1

## 2014-05-10 MED ORDER — CIPROFLOXACIN HCL 250 MG PO TABS
250.0000 mg | ORAL_TABLET | Freq: Two times a day (BID) | ORAL | Status: DC
  Administered 2014-05-10 – 2014-05-12 (×5): 250 mg via ORAL
  Filled 2014-05-10 (×6): qty 1

## 2014-05-10 NOTE — Progress Notes (Signed)
PROGRESS NOTE    [name] QBH:[id number] DOB: 28-Apr-1941 DOA: 05/08/2014 PCP: [name], MD  HPI/Brief narrative 73 year old female with history of HTN, COPD, chronic respiratory failure on home oxygen 2 L per minute, DM 2, hypercholesterolemia, hypothyroid, alcoholic cirrhosis, prior stroke, former smoker and alcohol abuse, presented to the ED on 05/08/14 after being seen by home health nurse for hypotension. In the ED, patient was hypotensive in the 70s, positive orthostatic blood pressures, and elevated WBC, negative chest x-ray, blood sugars 306. Patient has been noncompliant with her Lantus for unclear reasons.   Assessment/Plan:  1. Symptomatic hypotension: Most likely secondary to dehydration. Resolved after IV fluids but still soft. Monitor. 2. Dehydration: Probably secondary to poor oral intake. Patient denies nausea, vomiting, diarrhea or fevers. Claims to have a good appetite. Resolved after IV fluids. Encourage PO intake. 3. Acute renal failure/likely CKD: Most likely secondary to intravascular volume depletion. Creatinine in October 2013 was 0.57. Patient was hydrated intravenously. Creatinine has improved from 1.71 on admission to 1.5 range yesterday but has plateaued since then. ? Progression to chronic kidney disease from 2013. Will get renal ultrasound to rule out obstruction. DC IV fluids and follow BMP in a.m. 4. Poorly controlled type II DM: Secondary to noncompliance. Improved. Continue Lantus and SSI. Titrate insulins as needed. 5. Anemia: Probably at baseline. Initial hemoglobin was spuriously elevated secondary to dehydration. Stable. 6. Chronic respiratory failure/COPD/former smoker: Stable. Oxygen-dependent. 7. History of hypertension: Management as above. 8. History of alcoholic cirrhosis/former alcohol use:? Dementia 9. Hypothyroid: Recommend repeating in 4-6 weeks. Clinically euthyroid. Continue Synthroid. 10. Leukocytosis/? UTI: Send urine for  culture. Treat empirically with oral Cipro pending results.   Code Status: Full Family Communication: Discussed with patient. No family at bedside. Disposition Plan: To be determined? Home when medically stable.   Consultants:  None  Procedures:  None  Antibiotics:  None   Subjective: Denies complaints. Denies dyspnea or pain. "I love the hospital food". Had BM this morning. As per nurses, patient took off her oxygen this morning and went to the bathroom by herself.? some confusion.  Objective: Filed Vitals:   05/09/14 2130 05/10/14 0600 05/10/14 0850 05/10/14 0900  BP: 99/62 137/82    Pulse: 75 86    Temp: 97.9 F (36.6 C) 98.9 F (37.2 C)    TempSrc: Oral Oral    Resp: 18 18    Height:      Weight:      SpO2: 100% 93% 84% 88%    Intake/Output Summary (Last 24 hours) at 05/10/14 0920 Last data filed at 05/10/14 0741  Gross per 24 hour  Intake 4099.34 ml  Output   1005 ml  Net 3094.34 ml   Filed Weights   05/08/14 2250  Weight: 55.7 kg (122 lb 12.7 oz)     Exam:  General exam: Moderately built and frail elderly female, pleasant and comfortable lying supine in bed. Respiratory system: Distant breath sounds but clear to auscultation. No increased work of breathing. Cardiovascular system: S1 & S2 heard, RRR. No JVD, murmurs, gallops, clicks or pedal edema. Gastrointestinal system: Abdomen is nondistended, soft and nontender. Normal bowel sounds heard. Central nervous system: Alert and oriented to person, place and partly to time. No focal neurological deficits. Extremities: Symmetric 5 x 5 power.   Data Reviewed: Basic Metabolic Panel:  Recent Labs Lab 05/08/14 1900 05/09/14 0120 05/10/14 0618  NA 138 138 142  K 4.9 4.1 4.9  CL 97 102 107  CO2  24 25 23   GLUCOSE 306* 178* 116*  BUN 51* 47* 34*  CREATININE 1.71* 1.54* 1.57*  CALCIUM 10.2 9.1 8.8   Liver Function Tests:  Recent Labs Lab 05/08/14 1900  AST 18  ALT 19  ALKPHOS 65  BILITOT  <0.2*  PROT 7.7  ALBUMIN 3.8   No results found for this basename: LIPASE, AMYLASE,  in the last 168 hours No results found for this basename: AMMONIA,  in the last 168 hours CBC:  Recent Labs Lab 05/08/14 1900 05/09/14 0120 05/10/14 0618  WBC 12.5* 10.4 15.4*  NEUTROABS 6.1  --   --   HGB 12.5 10.3* 12.6  HCT 38.3 31.7* 39.7  MCV 93.9 94.3 96.8  PLT 203 172 180   Cardiac Enzymes: No results found for this basename: CKTOTAL, CKMB, CKMBINDEX, TROPONINI,  in the last 168 hours BNP (last 3 results) No results found for this basename: PROBNP,  in the last 8760 hours CBG:  Recent Labs Lab 05/09/14 0751 05/09/14 1126 05/09/14 1626 05/09/14 2025 05/10/14 0823  GLUCAP 84 234* 71 111* 135*    No results found for this or any previous visit (from the past 240 hour(s)).    Additional labs: 1. TSH: 5.6, free T4-1.11 2. Blood acetone: Negative     Studies: Dg Chest Port 1 View  05/08/2014   CLINICAL DATA:  Shortness of breath and cough  EXAM: PORTABLE CHEST - 1 VIEW  COMPARISON:  None  FINDINGS: Cardiac shadow is within normal limits. The lungs are well aerated bilaterally. No focal infiltrate or sizable effusion is seen. No bony abnormality is noted.  IMPRESSION: No acute abnormality noted.   Electronically Signed   By: Inez Catalina M.D.   On: 05/08/2014 20:59        Scheduled Meds: . cholecalciferol  4,000 Units Oral q morning - 10a  . enoxaparin (LOVENOX) injection  30 mg Subcutaneous QHS  . insulin aspart  0-5 Units Subcutaneous QHS  . insulin aspart  0-9 Units Subcutaneous TID WC  . insulin glargine  10 Units Subcutaneous QHS  . levothyroxine  125 mcg Oral QAC breakfast  . OLANZapine  10 mg Oral QHS  . pantoprazole  40 mg Oral QHS  . simvastatin  20 mg Oral QPM   Continuous Infusions:    Principal Problem:   Hypotension Active Problems:   HYPOTHYROIDISM   DIABETES MELLITUS, TYPE II   DYSLIPIDEMIA   BIPOLAR AFFECTIVE DISORDER   COPD (chronic  obstructive pulmonary disease)   Hyperglycemia   Leukocytosis   AKI (acute kidney injury)   Dehydration    Time spent: 20 minutes    Modena Jansky, MD, FACP, [facility]. [facility] Pager [phone]  If 7PM-7AM, please contact night-coverage [email or web address] Password [email or web address] 05/10/2014, 9:20 AM    LOS: 2 days

## 2014-05-10 NOTE — Progress Notes (Signed)
This patient is receiving Protonix IV. Based on criteria approved by the Pharmacy and Therapeutics Committee, this medication is being converted to the equivalent oral dose form. These criteria include:   . The patient is eating (either orally or per tube) and/or has been taking other orally administered medications for at least 24 hours.  . This patient has no evidence of active gastrointestinal bleeding or impaired GI absorption (gastrectomy, short bowel, patient on TNA or NPO).   If you have questions about this conversion, please contact the pharmacy department.  Minda Ditto, [facility] 05/10/2014 8:47 AM

## 2014-05-10 NOTE — Progress Notes (Signed)
PT Cancellation Note  Patient Details Name: [name] MRN: [id number] DOB: [birth year]   Cancelled Treatment:    Reason Eval/Treat Not Completed: Fatigue/lethargy limiting ability to participate; patient reports just walked with nursing staff.  RN reports patient walked with walker and oxygen and minimal assist to nurses station and back.  Will see tomorrow.   Max Sane 05/10/2014, 4:14 PM

## 2014-05-11 DIAGNOSIS — E1169 Type 2 diabetes mellitus with other specified complication: Secondary | ICD-10-CM

## 2014-05-11 DIAGNOSIS — R4701 Aphasia: Secondary | ICD-10-CM

## 2014-05-11 DIAGNOSIS — F0150 Vascular dementia without behavioral disturbance: Secondary | ICD-10-CM

## 2014-05-11 DIAGNOSIS — F3130 Bipolar disorder, current episode depressed, mild or moderate severity, unspecified: Secondary | ICD-10-CM

## 2014-05-11 LAB — GLUCOSE, CAPILLARY
GLUCOSE-CAPILLARY: 124 mg/dL — AB (ref 70–99)
GLUCOSE-CAPILLARY: 53 mg/dL — AB (ref 70–99)
GLUCOSE-CAPILLARY: 170 mg/dL — AB (ref 70–99)
GLUCOSE-CAPILLARY: 97 mg/dL (ref 70–99)
GLUCOSE-CAPILLARY: 111 mg/dL — AB (ref 70–99)
Glucose-Capillary: 176 mg/dL — ABNORMAL HIGH (ref 70–99)

## 2014-05-11 LAB — CBC
HCT: 34.9 % — ABNORMAL LOW (ref 36.0–46.0)
HEMOGLOBIN: 11.8 g/dL — AB (ref 12.0–15.0)
MCH: 32.3 pg (ref 26.0–34.0)
MCHC: 33.8 g/dL (ref 30.0–36.0)
MCV: 95.6 fL (ref 78.0–100.0)
Platelets: 151 10*3/uL (ref 150–400)
RBC: 3.65 MIL/uL — AB (ref 3.87–5.11)
RDW: 13.2 % (ref 11.5–15.5)
WBC: 11.6 10*3/uL — AB (ref 4.0–10.5)

## 2014-05-11 LAB — BASIC METABOLIC PANEL
BUN: 29 mg/dL — ABNORMAL HIGH (ref 6–23)
CHLORIDE: 110 meq/L (ref 96–112)
CO2: 25 mEq/L (ref 19–32)
Calcium: 8.7 mg/dL (ref 8.4–10.5)
Creatinine, Ser: 1.63 mg/dL — ABNORMAL HIGH (ref 0.50–1.10)
GFR calc non Af Amer: 30 mL/min — ABNORMAL LOW (ref >90)
GFR, EST AFRICAN AMERICAN: 35 mL/min — AB (ref >90)
Glucose, Bld: 53 mg/dL — ABNORMAL LOW (ref 70–99)
POTASSIUM: 4.3 meq/L (ref 3.7–5.3)
Sodium: 145 mEq/L (ref 137–147)

## 2014-05-11 MED ORDER — INSULIN GLARGINE 100 UNIT/ML ~~LOC~~ SOLN
5.0000 [IU] | Freq: Every day | SUBCUTANEOUS | Status: DC
  Administered 2014-05-11: 5 [IU] via SUBCUTANEOUS
  Filled 2014-05-11 (×2): qty 0.05

## 2014-05-11 MED ORDER — SODIUM CHLORIDE 0.45 % IV SOLN
INTRAVENOUS | Status: AC
  Administered 2014-05-11: 11:00:00 via INTRAVENOUS

## 2014-05-11 NOTE — Progress Notes (Signed)
Talked with Patient's friend Shanon Brow today who says he is her POA and has brought the paperwork here twice and we keep losing it.  Says that it would be ok if we thought patient needed to go to rehab for a few weeks and then he could get an apartment with her and take care of her.  He says that she does have children but they want nothing to do with her due to past chronic alcohol use and that he is the only one that can be relied on to care for her.

## 2014-05-11 NOTE — Progress Notes (Signed)
PROGRESS NOTE    [name] HGD:[id number] DOB: 11-02-1941 DOA: 05/08/2014 PCP: [name], MD  HPI/Brief narrative 73 year old female with history of HTN, COPD, chronic respiratory failure on home oxygen 2 L per minute, DM 2, hypercholesterolemia, hypothyroid, alcoholic cirrhosis, prior stroke, former smoker and alcohol abuse, presented to the ED on 05/08/14 after being seen by home health nurse for hypotension. In the ED, patient was hypotensive in the 70s, positive orthostatic blood pressures, and elevated WBC, negative chest x-ray, blood sugars 306. Patient has been noncompliant with her Lantus for unclear reasons.   Assessment/Plan:  1. Symptomatic hypotension: Most likely secondary to dehydration. Resolved after IV fluids but still soft. Monitor. 2. Dehydration: Probably secondary to poor oral intake. Patient denies nausea, vomiting, diarrhea or fevers. Claims to have a good appetite. Resolved after IV fluids. Encourage PO intake. 3. Acute renal failure/likely CKD: Most likely secondary to intravascular volume depletion. Creatinine in October 2013 was 0.57. Patient was hydrated intravenously. Creatinine has improved from 1.71 on admission but has plateaued in the 1.5-1.6 range since. ? Progression to chronic kidney disease from 2013. Will get renal ultrasound to rule out obstruction. DC IV fluids and follow BMP in a.m. 4. Poorly controlled type II DM/hypoglycemia: Secondary to noncompliance. Improved. Continue Lantus and SSI. On 5/17 a.m., patient had hypoglycemic CBG at 53 mg per DL. Will cut back Lantus to 5 units each bedtime. Monitor closely. 5. Anemia: Probably at baseline. Initial hemoglobin was spuriously elevated secondary to dehydration. Stable. 6. Chronic respiratory failure/COPD/former smoker: Stable. Oxygen-dependent. 7. History of hypertension: Management as above. 8. History of alcoholic cirrhosis/former alcohol use: According to friend 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 Patient is impulsive and confused intermittently > not sure if she understands or has medical decision capacity. Patient's friend Mr. Dye will be by later today to advise [place] whether her mental status is at baseline or not. Requested psychiatric consultation to evaluate medical decision making capacity including discharge disposition. 9. Hypothyroid: Recommend repeating in 4-6 weeks. Clinically euthyroid. Continue Synthroid. 10. Leukocytosis/? UTI: Send urine for culture. Treat empirically with oral Cipro pending results.   Code Status: Full Family Communication: Discussed with patient's friend [name]. According to patient and [name], patient has no immediate family and he is the only next of kin. Disposition Plan: To be determined? Home when medically stable.   Consultants:  None  Procedures:  None  Antibiotics:  Cipro 5/16 >  Subjective: Patient denies complaints. According to nursing, patient is impulsive, intermittently confused, at times gets up from the bed and walks away to the bathroom without using her oxygen.  Objective: Filed Vitals:   05/10/14 2136 05/10/14 2358 05/11/14 0608 05/11/14 0720  BP: 84/50 91/61 115/61   Pulse: 84 90 80 86  Temp: 98 F (36.7 C)  98 F (36.7 C)   TempSrc: Oral  Oral   Resp: 18  18 18   Height:      Weight:      SpO2: 98%  100% 97%    Intake/Output Summary (Last 24 hours) at 05/11/14 1034 Last data filed at 05/11/14 0900  Gross per 24 hour  Intake    850 ml  Output   1300 ml  Net   -450 ml   Filed Weights   05/08/14 2250  Weight: 55.7 kg (122 lb 12.7 oz)     Exam:  General exam: Moderately built and frail elderly female, pleasant and comfortable lying supine in  bed. Respiratory system: Distant breath sounds but clear to auscultation. No increased work of breathing. Cardiovascular system: S1 & S2 heard, RRR. No JVD, murmurs, gallops, clicks or pedal edema. Gastrointestinal  system: Abdomen is nondistended, soft and nontender. Normal bowel sounds heard. Central nervous system: Alert and oriented to person, place and partly to time. No focal neurological deficits. Extremities: Symmetric 5 x 5 power.   Data Reviewed: Basic Metabolic Panel:  Recent Labs Lab 05/08/14 1900 05/09/14 0120 05/10/14 0618 05/11/14 0535  NA 138 138 142 145  K 4.9 4.1 4.9 4.3  CL 97 102 107 110  CO2 24 25 23 25   GLUCOSE 306* 178* 116* 53*  BUN 51* 47* 34* 29*  CREATININE 1.71* 1.54* 1.57* 1.63*  CALCIUM 10.2 9.1 8.8 8.7   Liver Function Tests:  Recent Labs Lab 05/08/14 1900  AST 18  ALT 19  ALKPHOS 65  BILITOT <0.2*  PROT 7.7  ALBUMIN 3.8   No results found for this basename: LIPASE, AMYLASE,  in the last 168 hours No results found for this basename: AMMONIA,  in the last 168 hours CBC:  Recent Labs Lab 05/08/14 1900 05/09/14 0120 05/10/14 0618 05/11/14 0535  WBC 12.5* 10.4 15.4* 11.6*  NEUTROABS 6.1  --   --   --   HGB 12.5 10.3* 12.6 11.8*  HCT 38.3 31.7* 39.7 34.9*  MCV 93.9 94.3 96.8 95.6  PLT 203 172 180 151   Cardiac Enzymes: No results found for this basename: CKTOTAL, CKMB, CKMBINDEX, TROPONINI,  in the last 168 hours BNP (last 3 results) No results found for this basename: PROBNP,  in the last 8760 hours CBG:  Recent Labs Lab 05/10/14 1220 05/10/14 1706 05/10/14 2130 05/11/14 0711 05/11/14 0816  GLUCAP 119* 144* 124* 53* 170*    No results found for this or any previous visit (from the past 240 hour(s)).    Additional labs: 1. TSH: 5.6, free T4-1.11 2. Blood acetone: Negative     Studies: No results found.      Scheduled Meds: . cholecalciferol  4,000 Units Oral q morning - 10a  . ciprofloxacin  250 mg Oral BID  . enoxaparin (LOVENOX) injection  30 mg Subcutaneous QHS  . insulin aspart  0-5 Units Subcutaneous QHS  . insulin aspart  0-9 Units Subcutaneous TID WC  . insulin glargine  5 Units Subcutaneous QHS  .  levothyroxine  125 mcg Oral QAC breakfast  . OLANZapine  10 mg Oral QHS  . pantoprazole  40 mg Oral QHS  . simvastatin  20 mg Oral QPM   Continuous Infusions:    Principal Problem:   Hypotension Active Problems:   HYPOTHYROIDISM   DIABETES MELLITUS, TYPE II   DYSLIPIDEMIA   BIPOLAR AFFECTIVE DISORDER   COPD (chronic obstructive pulmonary disease)   Hyperglycemia   Leukocytosis   AKI (acute kidney injury)   Dehydration   UTI (urinary tract infection)    Time spent: 20 minutes    Modena Jansky, MD, FACP, [facility]. [facility] Pager [phone]  If 7PM-7AM, please contact night-coverage [email or web address] Password [email or web address] 05/11/2014, 10:34 AM    LOS: 3 days

## 2014-05-11 NOTE — Evaluation (Signed)
Physical Therapy Evaluation Patient Details Name: [name] MRN: [id number] DOB: June 13, 1941 Today's Date: 05/11/2014   History of Present Illness  73 y.o. female with h/o EtOH, bipolar, COPD on 24* home O2 admitted with hypotension, positive orthostatics.   Clinical Impression  *Pt admitted with hypotension**. Pt currently with functional limitations due to the deficits listed below (see PT Problem List).  Pt will benefit from skilled PT to increase their independence and safety with mobility to allow discharge to the venue listed below.   **    Follow Up Recommendations SNF    Equipment Recommendations  Rolling walker with 5" wheels    Recommendations for Other Services       Precautions / Restrictions Precautions Precautions: Fall Precaution Comments: Pt denies h/o falls, but stated she often feels dizzy upon standing. Restrictions Weight Bearing Restrictions: No      Mobility  Bed Mobility               General bed mobility comments: NT- pt up in chair  Transfers Overall transfer level: Needs assistance Equipment used: Rolling walker (2 wheeled) Transfers: Sit to/from Stand Sit to Stand: Min assist         General transfer comment: min A to rise, cues for hand placement  Ambulation/Gait Ambulation/Gait assistance: Min assist Ambulation Distance (Feet): 150 Feet Assistive device: 1 person hand held assist Gait Pattern/deviations: WFL(Within Functional Limits) Gait velocity: WFL   General Gait Details: pt ambulated with 4L O2 [place], distance limited by fatigue, pulse ox not available to check O2 sats, min HHA for balance for last 15' when pt became fatigued  Stairs            Wheelchair Mobility    Modified Rankin (Stroke Patients Only)       Balance Overall balance assessment: Needs assistance   Sitting balance-Leahy Scale: Good       Standing balance-Leahy Scale: Fair                               Pertinent  Vitals/Pain *0/10 pain 2/4 dyspnea with walking**    Home Living Family/patient expects to be discharged to:: Private residence Living Arrangements: Alone Available Help at Discharge: Friend(s) Type of Home: House Home Access: Stairs to enter Entrance Stairs-Rails: Right Entrance Stairs-Number of Steps: 3 Home Layout: One level Home Equipment: Other (comment) (oxygen)      Prior Function Level of Independence: Needs assistance   Gait / Transfers Assistance Needed: independent with oxygen, denies falls  ADL's / Homemaking Assistance Needed: sometimes needs help to bathe and dress        Hand Dominance        Extremity/Trunk Assessment   Upper Extremity Assessment: Overall WFL for tasks assessed           Lower Extremity Assessment: Overall WFL for tasks assessed      Cervical / Trunk Assessment: Normal  Communication   Communication: No difficulties  Cognition Arousal/Alertness: Awake/alert Behavior During Therapy: WFL for tasks assessed/performed Overall Cognitive Status: Within Functional Limits for tasks assessed                      General Comments      Exercises        Assessment/Plan    PT Assessment Patient needs continued PT services  PT Diagnosis Generalized weakness;Difficulty walking   PT Problem List Decreased activity tolerance;Decreased balance;Decreased mobility;Cardiopulmonary status  limiting activity  PT Treatment Interventions Gait training;DME instruction;Therapeutic activities;Patient/family education;Balance training;Therapeutic exercise   PT Goals (Current goals can be found in the Care Plan section) Acute Rehab PT Goals Patient Stated Goal: return home PT Goal Formulation: With patient Time For Goal Achievement: 05/25/14 Potential to Achieve Goals: Fair    Frequency Min 3X/week   Barriers to discharge Decreased caregiver support pt has a friend who checks in and assists with transportation several times/week     Co-evaluation               End of Session Equipment Utilized During Treatment: Gait belt;Oxygen Activity Tolerance: Patient limited by fatigue Patient left: in chair;with family/visitor present Nurse Communication: Mobility status    Functional Assessment Tool Used: clinical judgement Functional Limitation: Mobility: Walking and moving around Mobility: Walking and Moving Around Current Status (M0802): At least 20 percent but less than 40 percent impaired, limited or restricted Mobility: Walking and Moving Around Goal Status [phone]): At least 1 percent but less than 20 percent impaired, limited or restricted    Time: 1248-1305 PT Time Calculation (min): 17 min   Charges:   PT Evaluation $Initial PT Evaluation Tier I: 1 Procedure PT Treatments $Gait Training: 8-22 mins   PT G Codes:   Functional Assessment Tool Used: clinical judgement Functional Limitation: Mobility: Walking and moving around    [employer] 05/11/2014, 1:35 PM [phone]

## 2014-05-11 NOTE — Consult Note (Signed)
[facility] Face-to-Face Psychiatry Consult   Reason for Consult:  Capacity eval Referring Physician:  Dr. Rory Percy [name] is an 73 y.o. female. Total Time spent with patient: 45 minutes  Assessment: AXIS I:  Bipolar, Depressed by history, Dementia, Vascular and Aphasia AXIS II:  Deferred AXIS III:   Past Medical History  Diagnosis Date  . Hypertension   . COPD (chronic obstructive pulmonary disease)   . Diabetes mellitus   . Elevated cholesterol   . History of ETOH abuse   . Hypothyroid   . Cirrhosis, alcoholic   . Psychosis   . Lymphoma     T cell  . Stroke     Occipital 2004  . Asthma    AXIS IV:  other psychosocial or environmental problems, problems related to social environment and problems with primary support group AXIS V:  41-50 serious symptoms  Plan:  patient has no capacity to make her own medical decisions as per my evaluation May obtain [facility] if needed contact psych social services Appreciate psych consult and will sign off at this time.  Subjective:   [name] is a 73 y.o. female patient admitted with Hypotension.  HPI: Patient was seen, chart reviewed and case discussed with Dr. Algis Liming. Patient has been living individually with limited support from a friend and home health nurses. Patient has history of stroke and aphasia and probably vascular dementia. She was oriented to her name but confused about rest of the orientation, she could not tell me her age and name of the hospital and believe she was at home and has significant problems with memory, concentration and even language. She was unable to understand her current medical and mental health condition and has no capacity to discuss possible treatment options with certainty. Apparently she has friend who can identify what is her base line and may come to the hospital.   Medical history: She was presented with complaints of dizziness and had positive orthostatic vital signs changes. She also  had elevated sugars at 306 initially but after the bolus of fluids, this has come down to 280. When the patient was asked regarding her diabetes, she says she takes her Januvia but haven't been injecting her lantus at bedtime like she should. She says she hasn't been doing this for weeks. She says it's because too much is going on. She is with a friend who is also in the room with her and he is unsure what she takes. She says the [facility] nurse just started coming out to her home today.  HPI Elements:   Location:  confusion and memory deficits. Quality:  poor. Severity:  unable to care for her self. Timing:  need appropriate placement if she is not in her baseline\.  Past Psychiatric History: Past Medical History  Diagnosis Date  . Hypertension   . COPD (chronic obstructive pulmonary disease)   . Diabetes mellitus   . Elevated cholesterol   . History of ETOH abuse   . Hypothyroid   . Cirrhosis, alcoholic   . Psychosis   . Lymphoma     T cell  . Stroke     Occipital 2004  . Asthma     reports that she quit smoking about a year ago. Her smoking use included Cigarettes. She has a 28 pack-year smoking history. She has never used smokeless tobacco. She reports that she does not drink alcohol or use illicit drugs. History reviewed. No pertinent family history.   Living Arrangements: Alone  Abuse/Neglect [facility]) Physical Abuse: Denies Verbal Abuse: Denies Sexual Abuse: Denies Allergies:   Allergies  Allergen Reactions  . Bee Venom Anaphylaxis    ACT Assessment Complete:  no Objective: Blood pressure 115/61, pulse 86, temperature 98 F (36.7 C), temperature source Oral, resp. rate 18, height 5' 4"  (1.626 m), weight 55.7 kg (122 lb 12.7 oz), SpO2 97.00%.Body mass index is 21.07 kg/(m^2). Results for orders placed during the hospital encounter of 05/08/14 (from the past 72 hour(s))  COMPREHENSIVE METABOLIC PANEL     Status: Abnormal   Collection Time    05/08/14  7:00 PM      Result  Value Ref Range   Sodium 138  137 - 147 mEq/L   Potassium 4.9  3.7 - 5.3 mEq/L   Chloride 97  96 - 112 mEq/L   CO2 24  19 - 32 mEq/L   Glucose, Bld 306 (*) 70 - 99 mg/dL   BUN 51 (*) 6 - 23 mg/dL   Creatinine, Ser 1.71 (*) 0.50 - 1.10 mg/dL   Calcium 10.2  8.4 - 10.5 mg/dL   Total Protein 7.7  6.0 - 8.3 g/dL   Albumin 3.8  3.5 - 5.2 g/dL   AST 18  0 - 37 U/L   ALT 19  0 - 35 U/L   Alkaline Phosphatase 65  39 - 117 U/L   Total Bilirubin <0.2 (*) 0.3 - 1.2 mg/dL   GFR calc non Af Amer 28 (*) >90 mL/min   GFR calc Af Amer 33 (*) >90 mL/min   Comment: (NOTE)     The eGFR has been calculated using the CKD EPI equation.     This calculation has not been validated in all clinical situations.     eGFR's persistently <90 mL/min signify possible Chronic Kidney     Disease.  CBC WITH DIFFERENTIAL     Status: Abnormal   Collection Time    05/08/14  7:00 PM      Result Value Ref Range   WBC 12.5 (*) 4.0 - 10.5 K/uL   RBC 4.08  3.87 - 5.11 MIL/uL   Hemoglobin 12.5  12.0 - 15.0 g/dL   HCT 38.3  36.0 - 46.0 %   MCV 93.9  78.0 - 100.0 fL   MCH 30.6  26.0 - 34.0 pg   MCHC 32.6  30.0 - 36.0 g/dL   RDW 12.8  11.5 - 15.5 %   Platelets 203  150 - 400 K/uL   Neutrophils Relative % 49  43 - 77 %   Neutro Abs 6.1  1.7 - 7.7 K/uL   Lymphocytes Relative 42  12 - 46 %   Lymphs Abs 5.3 (*) 0.7 - 4.0 K/uL   Monocytes Relative 5  3 - 12 %   Monocytes Absolute 0.6  0.1 - 1.0 K/uL   Eosinophils Relative 4  0 - 5 %   Eosinophils Absolute 0.5  0.0 - 0.7 K/uL   Basophils Relative 0  0 - 1 %   Basophils Absolute 0.1  0.0 - 0.1 K/uL  LACTIC ACID, PLASMA     Status: Abnormal   Collection Time    05/08/14  7:00 PM      Result Value Ref Range   Lactic Acid, Venous 0.2 (*) 0.5 - 2.2 mmol/L  I-STAT TROPOININ, ED     Status: None   Collection Time    05/08/14  7:19 PM      Result Value Ref Range   Troponin  i, poc 0.00  0.00 - 0.08 ng/mL   Comment 3            Comment: Due to the release kinetics of cTnI,      a negative result within the first hours     of the onset of symptoms does not rule out     myocardial infarction with certainty.     If myocardial infarction is still suspected,     repeat the test at appropriate intervals.  CBG MONITORING, ED     Status: Abnormal   Collection Time    05/08/14  8:55 PM      Result Value Ref Range   Glucose-Capillary 280 (*) 70 - 99 mg/dL  GLUCOSE, CAPILLARY     Status: Abnormal   Collection Time    05/09/14 12:12 AM      Result Value Ref Range   Glucose-Capillary 163 (*) 70 - 99 mg/dL   Comment 1 Notify RN    BASIC METABOLIC PANEL     Status: Abnormal   Collection Time    05/09/14  1:20 AM      Result Value Ref Range   Sodium 138  137 - 147 mEq/L   Potassium 4.1  3.7 - 5.3 mEq/L   Chloride 102  96 - 112 mEq/L   CO2 25  19 - 32 mEq/L   Glucose, Bld 178 (*) 70 - 99 mg/dL   BUN 47 (*) 6 - 23 mg/dL   Creatinine, Ser 1.54 (*) 0.50 - 1.10 mg/dL   Calcium 9.1  8.4 - 10.5 mg/dL   GFR calc non Af Amer 32 (*) >90 mL/min   GFR calc Af Amer 37 (*) >90 mL/min   Comment: (NOTE)     The eGFR has been calculated using the CKD EPI equation.     This calculation has not been validated in all clinical situations.     eGFR's persistently <90 mL/min signify possible Chronic Kidney     Disease.  CBC     Status: Abnormal   Collection Time    05/09/14  1:20 AM      Result Value Ref Range   WBC 10.4  4.0 - 10.5 K/uL   RBC 3.36 (*) 3.87 - 5.11 MIL/uL   Hemoglobin 10.3 (*) 12.0 - 15.0 g/dL   Comment: DELTA CHECK NOTED     REPEATED TO VERIFY   HCT 31.7 (*) 36.0 - 46.0 %   MCV 94.3  78.0 - 100.0 fL   MCH 30.7  26.0 - 34.0 pg   MCHC 32.5  30.0 - 36.0 g/dL   RDW 12.8  11.5 - 15.5 %   Platelets 172  150 - 400 K/uL  KETONES, QUALITATIVE     Status: None   Collection Time    05/09/14  1:20 AM      Result Value Ref Range   Acetone, Bld NEGATIVE  NEGATIVE  TSH     Status: Abnormal   Collection Time    05/09/14  1:20 AM      Result Value Ref Range   TSH  5.600 (*) 0.350 - 4.500 uIU/mL   Comment: Please note change in reference range.     Performed at [facility]  T4, FREE     Status: None   Collection Time    05/09/14  1:20 AM      Result Value Ref Range   Free T4 1.11  0.80 - 1.80 ng/dL   Comment: Performed at  [facility]  GLUCOSE, CAPILLARY     Status: None   Collection Time    05/09/14  4:11 AM      Result Value Ref Range   Glucose-Capillary 71  70 - 99 mg/dL   Comment 1 Notify RN    GLUCOSE, CAPILLARY     Status: None   Collection Time    05/09/14  7:51 AM      Result Value Ref Range   Glucose-Capillary 84  70 - 99 mg/dL   Comment 1 Notify RN    GLUCOSE, CAPILLARY     Status: Abnormal   Collection Time    05/09/14 11:26 AM      Result Value Ref Range   Glucose-Capillary 234 (*) 70 - 99 mg/dL   Comment 1 Notify RN    HEMOGLOBIN A1C     Status: Abnormal   Collection Time    05/09/14 12:51 PM      Result Value Ref Range   Hemoglobin A1C 6.8 (*) <5.7 %   Comment: (NOTE)                                                                               According to the ADA Clinical Practice Recommendations for 2011, when     HbA1c is used as a screening test:      >=6.5%   Diagnostic of Diabetes Mellitus               (if abnormal result is confirmed)     5.7-6.4%   Increased risk of developing Diabetes Mellitus     References:Diagnosis and Classification of Diabetes Mellitus,Diabetes     XKPV,3748,27(MBEML 1):S62-S69 and Standards of Medical Care in             Diabetes - 2011,Diabetes Care,2011,34 (Suppl 1):S11-S61.   Mean Plasma Glucose 148 (*) <117 mg/dL   Comment: Performed at [facility], CAPILLARY     Status: None   Collection Time    05/09/14  4:26 PM      Result Value Ref Range   Glucose-Capillary 71  70 - 99 mg/dL   Comment 1 Notify RN    URINALYSIS, ROUTINE W REFLEX MICROSCOPIC     Status: Abnormal   Collection Time    05/09/14  6:27 PM      Result Value Ref Range   Color,  Urine YELLOW  YELLOW   APPearance CLOUDY (*) CLEAR   Specific Gravity, Urine 1.011  1.005 - 1.030   pH 5.0  5.0 - 8.0   Glucose, UA 250 (*) NEGATIVE mg/dL   Hgb urine dipstick NEGATIVE  NEGATIVE   Bilirubin Urine NEGATIVE  NEGATIVE   Ketones, ur NEGATIVE  NEGATIVE mg/dL   Protein, ur NEGATIVE  NEGATIVE mg/dL   Urobilinogen, UA 0.2  0.0 - 1.0 mg/dL   Nitrite NEGATIVE  NEGATIVE   Leukocytes, UA LARGE (*) NEGATIVE  URINE MICROSCOPIC-ADD ON     Status: None   Collection Time    05/09/14  6:27 PM      Result Value Ref Range   Squamous Epithelial / LPF RARE  RARE   WBC, UA TOO NUMEROUS TO COUNT  <  3 WBC/hpf   Bacteria, UA RARE  RARE  GLUCOSE, CAPILLARY     Status: Abnormal   Collection Time    05/09/14  8:25 PM      Result Value Ref Range   Glucose-Capillary 111 (*) 70 - 99 mg/dL   Comment 1 Notify RN    CBC     Status: Abnormal   Collection Time    05/10/14  6:18 AM      Result Value Ref Range   WBC 15.4 (*) 4.0 - 10.5 K/uL   RBC 4.10  3.87 - 5.11 MIL/uL   Hemoglobin 12.6  12.0 - 15.0 g/dL   Comment: DELTA CHECK NOTED     REPEATED TO VERIFY   HCT 39.7  36.0 - 46.0 %   MCV 96.8  78.0 - 100.0 fL   MCH 30.7  26.0 - 34.0 pg   MCHC 31.7  30.0 - 36.0 g/dL   RDW 13.1  11.5 - 15.5 %   Platelets 180  150 - 400 K/uL  BASIC METABOLIC PANEL     Status: Abnormal   Collection Time    05/10/14  6:18 AM      Result Value Ref Range   Sodium 142  137 - 147 mEq/L   Potassium 4.9  3.7 - 5.3 mEq/L   Chloride 107  96 - 112 mEq/L   CO2 23  19 - 32 mEq/L   Glucose, Bld 116 (*) 70 - 99 mg/dL   BUN 34 (*) 6 - 23 mg/dL   Creatinine, Ser 1.57 (*) 0.50 - 1.10 mg/dL   Calcium 8.8  8.4 - 10.5 mg/dL   GFR calc non Af Amer 32 (*) >90 mL/min   GFR calc Af Amer 37 (*) >90 mL/min   Comment: (NOTE)     The eGFR has been calculated using the CKD EPI equation.     This calculation has not been validated in all clinical situations.     eGFR's persistently <90 mL/min signify possible Chronic Kidney      Disease.  GLUCOSE, CAPILLARY     Status: Abnormal   Collection Time    05/10/14  8:23 AM      Result Value Ref Range   Glucose-Capillary 135 (*) 70 - 99 mg/dL   Comment 1 Notify RN    GLUCOSE, CAPILLARY     Status: Abnormal   Collection Time    05/10/14 12:20 PM      Result Value Ref Range   Glucose-Capillary 119 (*) 70 - 99 mg/dL  GLUCOSE, CAPILLARY     Status: Abnormal   Collection Time    05/10/14  5:06 PM      Result Value Ref Range   Glucose-Capillary 144 (*) 70 - 99 mg/dL   Comment 1 Notify RN    GLUCOSE, CAPILLARY     Status: Abnormal   Collection Time    05/10/14  9:30 PM      Result Value Ref Range   Glucose-Capillary 124 (*) 70 - 99 mg/dL  BASIC METABOLIC PANEL     Status: Abnormal   Collection Time    05/11/14  5:35 AM      Result Value Ref Range   Sodium 145  137 - 147 mEq/L   Potassium 4.3  3.7 - 5.3 mEq/L   Chloride 110  96 - 112 mEq/L   CO2 25  19 - 32 mEq/L   Glucose, Bld 53 (*) 70 - 99 mg/dL   BUN 29 (*)  6 - 23 mg/dL   Creatinine, Ser 1.63 (*) 0.50 - 1.10 mg/dL   Calcium 8.7  8.4 - 10.5 mg/dL   GFR calc non Af Amer 30 (*) >90 mL/min   GFR calc Af Amer 35 (*) >90 mL/min   Comment: (NOTE)     The eGFR has been calculated using the CKD EPI equation.     This calculation has not been validated in all clinical situations.     eGFR's persistently <90 mL/min signify possible Chronic Kidney     Disease.  CBC     Status: Abnormal   Collection Time    05/11/14  5:35 AM      Result Value Ref Range   WBC 11.6 (*) 4.0 - 10.5 K/uL   RBC 3.65 (*) 3.87 - 5.11 MIL/uL   Hemoglobin 11.8 (*) 12.0 - 15.0 g/dL   HCT 34.9 (*) 36.0 - 46.0 %   MCV 95.6  78.0 - 100.0 fL   MCH 32.3  26.0 - 34.0 pg   MCHC 33.8  30.0 - 36.0 g/dL   RDW 13.2  11.5 - 15.5 %   Platelets 151  150 - 400 K/uL  GLUCOSE, CAPILLARY     Status: Abnormal   Collection Time    05/11/14  7:11 AM      Result Value Ref Range   Glucose-Capillary 53 (*) 70 - 99 mg/dL  GLUCOSE, CAPILLARY     Status:  Abnormal   Collection Time    05/11/14  8:16 AM      Result Value Ref Range   Glucose-Capillary 170 (*) 70 - 99 mg/dL  GLUCOSE, CAPILLARY     Status: Abnormal   Collection Time    05/11/14 11:59 AM      Result Value Ref Range   Glucose-Capillary 176 (*) 70 - 99 mg/dL   Labs are reviewed and are pertinent for as above  Current Facility-Administered Medications  Medication Dose Route Frequency Provider Last Rate Last Dose  . 0.45 % sodium chloride infusion   Intravenous Continuous Modena Jansky, MD      . acetaminophen (TYLENOL) tablet 650 mg  650 mg Oral Q6H PRN Modena Jansky, MD      . budesonide (PULMICORT) nebulizer solution 0.5 mg  0.5 mg Nebulization Daily PRN Dot Lanes, MD      . cholecalciferol (VITAMIN D) tablet 4,000 Units  4,000 Units Oral q morning - 10a Dot Lanes, MD   4,000 Units at 05/11/14 [phone]  . ciprofloxacin (CIPRO) tablet 250 mg  250 mg Oral BID Modena Jansky, MD   250 mg at 05/11/14 0719  . enoxaparin (LOVENOX) injection 30 mg  30 mg Subcutaneous QHS Dot Lanes, MD   30 mg at 05/10/14 2252  . insulin aspart (novoLOG) injection 0-5 Units  0-5 Units Subcutaneous QHS Modena Jansky, MD      . insulin aspart (novoLOG) injection 0-9 Units  0-9 Units Subcutaneous TID WC Modena Jansky, MD   1 Units at 05/10/14 1828  . insulin glargine (LANTUS) injection 5 Units  5 Units Subcutaneous QHS Modena Jansky, MD      . ipratropium-albuterol (DUONEB) 0.5-2.5 (3) MG/3ML nebulizer solution 3 mL  3 mL Nebulization Q6H PRN Dot Lanes, MD      . levothyroxine (SYNTHROID, LEVOTHROID) tablet 125 mcg  125 mcg Oral QAC breakfast Dot Lanes, MD   125 mcg at 05/11/14 0719  . OLANZapine (ZYPREXA) tablet 10 mg  10 mg Oral QHS Dot Lanes, MD   10 mg at 05/10/14 2252  . pantoprazole (PROTONIX) EC tablet 40 mg  40 mg Oral QHS Minda Ditto, RPH   40 mg at 05/10/14 2252  . simvastatin (ZOCOR) tablet 20 mg  20 mg Oral QPM Dot Lanes, MD   20 mg at  05/10/14 1827    Psychiatric Specialty Exam:     Blood pressure 115/61, pulse 86, temperature 98 F (36.7 C), temperature source Oral, resp. rate 18, height 5' 4"  (1.626 m), weight 55.7 kg (122 lb 12.7 oz), SpO2 97.00%.Body mass index is 21.07 kg/(m^2).  General Appearance: Casual  Eye Contact::  Fair  Speech:  Clear and Coherent and Slow  Volume:  Decreased  Mood:  Anxious  Affect:  Congruent  Thought Process:  Disorganized and Loose  Orientation:  Full (Time, Place, and Person)  Thought Content:  difficult to follow  Suicidal Thoughts:  No  Homicidal Thoughts:  No  Memory:  Immediate;   Poor Recent;   Poor  Judgement:  Impaired  Insight:  Lacking  Psychomotor Activity:  Psychomotor Retardation  Concentration:  Poor  Recall:  Poor  Fund of Knowledge:Poor  Language: Poor  Akathisia:  No  Handed:  Right  AIMS (if indicated):     Assets:  Financial Resources/Insurance Housing Leisure Time  Sleep:      Musculoskeletal: Strength & Muscle Tone: within normal limits Gait & Station: unable to stand Patient leans: N/A  Treatment Plan Summary: Daily contact with patient to assess and evaluate symptoms and progress in treatment Medication management  [name] 05/11/2014 12:12 PM

## 2014-05-12 ENCOUNTER — Observation Stay (HOSPITAL_COMMUNITY): Payer: Medicare Other

## 2014-05-12 LAB — URINE CULTURE
COLONY COUNT: NO GROWTH
Culture: NO GROWTH

## 2014-05-12 LAB — GLUCOSE, CAPILLARY
GLUCOSE-CAPILLARY: 105 mg/dL — AB (ref 70–99)
Glucose-Capillary: 203 mg/dL — ABNORMAL HIGH (ref 70–99)

## 2014-05-12 LAB — BASIC METABOLIC PANEL
BUN: 25 mg/dL — ABNORMAL HIGH (ref 6–23)
CHLORIDE: 104 meq/L (ref 96–112)
CO2: 25 mEq/L (ref 19–32)
Calcium: 8.8 mg/dL (ref 8.4–10.5)
Creatinine, Ser: 1.39 mg/dL — ABNORMAL HIGH (ref 0.50–1.10)
GFR calc Af Amer: 42 mL/min — ABNORMAL LOW (ref >90)
GFR calc non Af Amer: 37 mL/min — ABNORMAL LOW (ref >90)
Glucose, Bld: 122 mg/dL — ABNORMAL HIGH (ref 70–99)
Potassium: 4.4 mEq/L (ref 3.7–5.3)
Sodium: 141 mEq/L (ref 137–147)

## 2014-05-12 MED ORDER — CIPROFLOXACIN HCL 250 MG PO TABS: 250.0000 mg | ORAL_TABLET | Freq: Every evening | ORAL | Status: DC

## 2014-05-12 MED ORDER — SITAGLIPTIN PHOSPHATE 100 MG PO TABS: 50.0000 mg | ORAL_TABLET | Freq: Every day | ORAL | Status: DC

## 2014-05-12 MED ORDER — INSULIN GLARGINE 100 UNIT/ML ~~LOC~~ SOLN: 5.0000 [IU] | Freq: Every day | SUBCUTANEOUS | Status: DC

## 2014-05-12 NOTE — Progress Notes (Signed)
Clinical Social Work Department BRIEF PSYCHOSOCIAL ASSESSMENT 05/12/2014  Patient:  [name], [name]     Account Number:  [id number]     Admit date:  05/08/2014  Clinical Social Worker:  Earlie Server  Date/Time:  05/12/2014 12:00 N  Referred by:  Physician  Date Referred:  05/12/2014 Referred for  SNF Placement   Other Referral:   Interview type:  Patient Other interview type:    PSYCHOSOCIAL DATA Living Status:  ALONE Admitted from facility:   Level of care:   Primary support name:  [name] Primary support relationship to patient:  FRIEND Degree of support available:   Adequate    CURRENT CONCERNS Current Concerns  Post-Acute Placement   Other Concerns:    SOCIAL WORK ASSESSMENT / PLAN CSW received referral to assist with DC planning. Per chart review, PT recommends SNF placement. Psych was also consulted and determined patient did not have capacity. CSW met with patient and friend [name]) at [place].    CSW reviewed chart which stated that patient had advanced directives placed in chart. Per HCPOA, [name] is first listed. The number for [name] has been disconnected. CSW unable to locate [name] through IT consultant. Patient and friend do not have contact information for [name] and patient reports she wants [name] to make decisions for her. [name] is listed as alternative HCPOA if [name] is not available.    CSW spoke with friend and patient re: PT recommendations for SNF placement. Patient has been to SNF in the past and is aware that Medicare will not cover SNF placement at DC. Friend upset that Medicare will not cover SNF because she has been in the past. CSW explained that the hospital stay was different at that time but Medicare will not cover placement at this time. Friend and patient report understanding and do not want to pay privately for SNF placement.    Patient reports she will return home. Friend agreeable to talk to landlord in order to determine  if he can move in with patient. CSW inquired about plans if patient is unable to move in with patient. Patient and friend report patient will still return home and will plan to use [employer] services. Friend aware of DC today and will leave to talk with landlord.    CSW made CM aware of HH needs. Patient and friend report understanding of plans and agreeable to DC home with [facility].   Assessment/plan status:  No Further Intervention Required Other assessment/ plan:   Information/referral to community resources:   SNF information    PATIENT'S/FAMILY'S RESPONSE TO PLAN OF CARE: Patient has flat affect and disengaged. Psych MD documented that patient does not have capacity. Friend upset about SNF not being covered. Friend easily distracted and has to be redirected multiple times throughout assessment. Friend and patient have discussed DC plans and agreeable for patient to return home. Friend agreeable to assist as much as needed and to live with patient if he is allowed. Friend and patient aware and agreeable to plans.       [place], [place] [phone]

## 2014-05-12 NOTE — Progress Notes (Signed)
PROGRESS NOTE    [name] KDX:[id number] DOB: 11/20/1941 DOA: 05/08/2014 PCP: [name], MD  HPI/Brief narrative 73 year old female with history of HTN, COPD, chronic respiratory failure on home oxygen 2 L per minute, DM 2, hypercholesterolemia, hypothyroid, alcoholic cirrhosis, prior stroke, former smoker and alcohol abuse, presented to the ED on 05/08/14 after being seen by home health nurse for hypotension. In the ED, patient was hypotensive in the 70s, positive orthostatic blood pressures, and elevated WBC, negative chest x-ray, blood sugars 306. Patient has been noncompliant with her Lantus for unclear reasons.   Assessment/Plan:  1. Symptomatic hypotension: Most likely secondary to dehydration. Resolved after IV fluids. Monitor. 2. Dehydration: Probably secondary to poor oral intake. Patient denies nausea, vomiting, diarrhea or fevers. Claims to have a good appetite. Resolved after IV fluids. Encourage PO intake. 3. Acute renal failure/likely CKD: Most likely secondary to intravascular volume depletion. Creatinine in October 2013 was 0.57. Patient was hydrated intravenously. Creatinine had improved from 1.71 on admission but had plateaued in the 1.5-1.6 range since. ? Progression to chronic kidney disease from 2013. Renal ultrasound: no hydronephrosis. Treated with brief IV fluids yesterday and creatinine has improved to 1.39 range. DC IV fluids.  4. Poorly controlled type II DM/hypoglycemia: Secondary to noncompliance. Improved. Continue Lantus and SSI. On 5/17 a.m., patient had hypoglycemic CBG at 53 mg per DL. Will cut back Lantus to 5 units each bedtime. Monitor closely. No further hypoglycemic episodes. 5. Anemia: Probably at baseline. Initial hemoglobin was spuriously elevated secondary to dehydration. Stable. 6. Chronic respiratory failure/COPD/former smoker: Stable. Oxygen-dependent. 7. History of hypertension: Management as above. 8. History of alcoholic  cirrhosis/former alcohol use: According to friend Mr. 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 Patient is impulsive and confused intermittently > not sure if she understands or has medical decision capacity. As per nursing, Mr. [name] saw patient in the hospital yesterday and stated that her mental status was at baseline now. Psychiatry evaluation appreciated-lacks capacity to make medical decisions. 9. Hypothyroid: Recommend repeating in 4-6 weeks. Clinically euthyroid. Continue Synthroid. 10. Leukocytosis/? UTI: Send urine for culture. Treat empirically with oral Cipro pending results. 11. Dementia/history of bipolar disorder-depressed/history of aphasia/lacks capacity for medical decision making:   Code Status: Full Family Communication: Discussed with patient's friend Mr. [name] on 5/17. According to patient and Mr. [name], patient has no immediate family and he is the only next of kin. Disposition Plan: Patient lacks capacity to make medical decision. Physical therapy recommends SNF. As per notes in the chart, Mr. [name], patient's friend states that he has POA-trying to obtain paperwork to confirm same. He is okay with her going to SNF for rehabilitation. Patient however is observation status and as per discussion with case management on 5/18, she will have to self-pay if she goes to SNF. Have placed a call into M.D. For peer to peer re admission status change.  Consultants:  None  Procedures:  None  Antibiotics:  Cipro 5/16 >  Subjective: Patient denies complaints.   Objective: Filed Vitals:   05/11/14 0720 05/11/14 1428 05/11/14 2106 05/12/14 0614  BP:  91/50 110/75 118/74  Pulse: 86 83 80 106  Temp:  97.9 F (36.6 C) 98.2 F (36.8 C) 98.2 F (36.8 C)  TempSrc:  Oral Oral Oral  Resp: 18 16 18 18   Height:      Weight:      SpO2: 97% 97% 99% 96%    Intake/Output Summary (Last 24 hours) at  05/12/14 1019 Last data filed at 05/12/14 0700  Gross per  24 hour  Intake    720 ml  Output   1650 ml  Net   -930 ml   Filed Weights   05/08/14 2250  Weight: 55.7 kg (122 lb 12.7 oz)     Exam:  General exam: Moderately built and frail elderly female, pleasant and comfortable lying supine in bed. Respiratory system: Distant breath sounds but clear to auscultation. No increased work of breathing. Cardiovascular system: S1 & S2 heard, RRR. No JVD, murmurs, gallops, clicks or pedal edema. Gastrointestinal system: Abdomen is nondistended, soft and nontender. Normal bowel sounds heard. Central nervous system: Alert and oriented to person only. No focal neurological deficits. Extremities: Symmetric 5 x 5 power.   Data Reviewed: Basic Metabolic Panel:  Recent Labs Lab 05/08/14 1900 05/09/14 0120 05/10/14 0618 05/11/14 0535 05/12/14 0441  NA 138 138 142 145 141  K 4.9 4.1 4.9 4.3 4.4  CL 97 102 107 110 104  CO2 24 25 23 25 25   GLUCOSE 306* 178* 116* 53* 122*  BUN 51* 47* 34* 29* 25*  CREATININE 1.71* 1.54* 1.57* 1.63* 1.39*  CALCIUM 10.2 9.1 8.8 8.7 8.8   Liver Function Tests:  Recent Labs Lab 05/08/14 1900  AST 18  ALT 19  ALKPHOS 65  BILITOT <0.2*  PROT 7.7  ALBUMIN 3.8   No results found for this basename: LIPASE, AMYLASE,  in the last 168 hours No results found for this basename: AMMONIA,  in the last 168 hours CBC:  Recent Labs Lab 05/08/14 1900 05/09/14 0120 05/10/14 0618 05/11/14 0535  WBC 12.5* 10.4 15.4* 11.6*  NEUTROABS 6.1  --   --   --   HGB 12.5 10.3* 12.6 11.8*  HCT 38.3 31.7* 39.7 34.9*  MCV 93.9 94.3 96.8 95.6  PLT 203 172 180 151   Cardiac Enzymes: No results found for this basename: CKTOTAL, CKMB, CKMBINDEX, TROPONINI,  in the last 168 hours BNP (last 3 results) No results found for this basename: PROBNP,  in the last 8760 hours CBG:  Recent Labs Lab 05/11/14 0816 05/11/14 1159 05/11/14 1638 05/11/14 2105 05/12/14 0717  GLUCAP 170* 176* 97 111* 105*    No results found for this or  any previous visit (from the past 240 hour(s)).    Additional labs: 1. TSH: 5.6, free T4-1.11 2. Blood acetone: Negative     Studies: US Renal  05/12/2014   CLINICAL DATA:  Acute on chronic kidney disease. Rule out obstruction. Diabetic.  EXAM: RENAL/URINARY TRACT ULTRASOUND COMPLETE  COMPARISON:  10/01/2005 CT.  FINDINGS: Right Kidney:  Length: 10.4 cm. Slightly echogenic which may represent result of medical renal disease. No mass or hydronephrosis visualized.  Left Kidney:  Length: 9.9. Minimally lobulated. No mass or hydronephrosis visualized.  Bladder:  Appears normal for degree of bladder distention.  IMPRESSION: No evidence of hydronephrosis.  Right kidney appears slightly echogenic which may represent result of medical renal disease.   Electronically Signed   By: Chauncey Cruel M.D.   On: 05/12/2014 08:14        Scheduled Meds: . cholecalciferol  4,000 Units Oral q morning - 10a  . ciprofloxacin  250 mg Oral BID  . enoxaparin (LOVENOX) injection  30 mg Subcutaneous QHS  . insulin aspart  0-5 Units Subcutaneous QHS  . insulin aspart  0-9 Units Subcutaneous TID WC  . insulin glargine  5 Units Subcutaneous QHS  . levothyroxine  125 mcg Oral QAC  breakfast  . OLANZapine  10 mg Oral QHS  . pantoprazole  40 mg Oral QHS  . simvastatin  20 mg Oral QPM   Continuous Infusions:    Principal Problem:   Hypotension Active Problems:   HYPOTHYROIDISM   DIABETES MELLITUS, TYPE II   DYSLIPIDEMIA   BIPOLAR AFFECTIVE DISORDER   COPD (chronic obstructive pulmonary disease)   Hyperglycemia   Leukocytosis   AKI (acute kidney injury)   Dehydration   UTI (urinary tract infection)    Time spent: 20 minutes    Modena Jansky, MD, FACP, [facility]. [facility] Pager [phone]  If 7PM-7AM, please contact night-coverage [email or web address] Password [email or web address] 05/12/2014, 10:19 AM    LOS: 4 days

## 2014-05-12 NOTE — Discharge Summary (Signed)
Physician Discharge Summary  [name]:[id number] DOB: Jan 28, 1941 DOA: 05/08/2014  PCP: [name], MD  Admit date: 05/08/2014 Discharge date: 05/12/2014  Time spent: Greater than 30 minutes  Recommendations for Outpatient Follow-up:  1. Dr. Billey Chang, PCP in 3 days with repeat labs (CBC & BMP).  2. Recommend repeating TSH in 4-6 weeks. 3. Home health PT, OT, RN, Aide & social worker.  Discharge Diagnoses:  Principal Problem:   Hypotension Active Problems:   HYPOTHYROIDISM   DIABETES MELLITUS, TYPE II   DYSLIPIDEMIA   BIPOLAR AFFECTIVE DISORDER   COPD (chronic obstructive pulmonary disease)   Hyperglycemia   Leukocytosis   AKI (acute kidney injury)   Dehydration   UTI (urinary tract infection)   Discharge Condition: Improved & Stable  Diet recommendation: Heart Healthy & Diabetic diet.  Filed Weights   05/08/14 2250  Weight: 55.7 kg (122 lb 12.7 oz)    History of present illness:  73 year old female with history of HTN, COPD, chronic respiratory failure on home oxygen 2 L per minute, DM 2, hypercholesterolemia, hypothyroid, alcoholic cirrhosis, prior stroke, former smoker and alcohol abuse, presented to the ED on 05/08/14 after being seen by home health nurse for hypotension. In the ED, patient was hypotensive in the 70s, positive orthostatic blood pressures, and elevated WBC, negative chest x-ray, blood sugars 306. Patient has been noncompliant with her Lantus for unclear reasons.  Hospital Course:  1. Symptomatic hypotension: Most likely secondary to dehydration. Resolved after IV fluids.  2. Dehydration: Probably secondary to poor oral intake. Patient denies nausea, vomiting, diarrhea or fevers. Claims to have a good appetite. Resolved after IV fluids. Encourage PO intake. 3. Acute renal failure/likely CKD: Most likely secondary to intravascular volume depletion. Creatinine in October 2013 was 0.57. Patient was hydrated intravenously. Creatinine had  improved from 1.71 on admission but had plateaued in the 1.5-1.6 range since. ? Progression to chronic kidney disease since 2013. Renal ultrasound: no hydronephrosis. Treated with brief IV fluids yesterday and creatinine has improved to 1.39 range. DC IV fluids. FU BMP as OP in couple of days. Home Lasix will be temporarily held secondary to recent acute renal insufficiency. 4. Poorly controlled type II DM/hypoglycemia: Secondary to noncompliance. Blood glucose on admission: 306.  Improved. Patient was treated with significantly reduced dose of Lantus and NovoLog SSI. On 5/17 a.m., patient had hypoglycemic CBG at 53 mg per DL. Reduced Lantus to 5 units each bedtime. Monitor closely. No further hypoglycemic episodes. Patient will be discharged on a reduced Lantus 5 units each bedtime, reduced Januvia and adjusted to renal functions. Will hold metformin secondary to renal insufficiency. Requested nursing to provide patient's health care power of attorney education regarding diabetes management-including CBG checks, hypoglycemia monitoring and management, administration of insulin etc. He apparently is a diabetic too. 5. Anemia: Probably at baseline. Initial hemoglobin was spuriously elevated secondary to dehydration. Stable. 6. Chronic respiratory failure/COPD/former smoker: Stable. Oxygen-dependent. 7. History of hypertension: Management as above. 8. History of alcoholic cirrhosis/former alcohol use: According to friend 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 Patient is impulsive and confused intermittently > she does not have medical decision capacity. As per nursing, [name] saw patient in the hospital yesterday and stated that her mental status was at baseline now. Psychiatry evaluation appreciated-lacks capacity to make medical decisions. 9. Hypothyroid: Recommend repeating in 4-6 weeks. Clinically euthyroid. Continue Synthroid. 10. Leukocytosis/? UTI: Treated empirically with  oral Cipro. Subsequent urine culture results negative. 11. Dementia/history of bipolar  disorder-depressed/history of aphasia/lacks capacity for medical decision making:  Discussed with care management medical director- Patient currently qualifies only as an observation status. She can go to SNF only as a self-pay. Case management and clinical social worker discussed her discharge disposition with healthcare power of attorney [name] who apparently declined SNF and wishes to take her home and is exploring the possibility of him staying with her to provide 24/7 supervision and assistance.  Consultations:  Psychiatry  Procedures:  None    Discharge Exam:  Complaints:  Patient pleasantly confused. Denies complaints.  Filed Vitals:   05/11/14 0720 05/11/14 1428 05/11/14 2106 05/12/14 0614  BP:  91/50 110/75 118/74  Pulse: 86 83 80 106  Temp:  97.9 F (36.6 C) 98.2 F (36.8 C) 98.2 F (36.8 C)  TempSrc:  Oral Oral Oral  Resp: 18 16 18 18   Height:      Weight:      SpO2: 97% 97% 99% 96%    General exam: Moderately built and frail elderly female, pleasant and comfortable lying supine in bed.  Respiratory system: Distant breath sounds but clear to auscultation. No increased work of breathing.  Cardiovascular system: S1 & S2 heard, RRR. No JVD, murmurs, gallops, clicks or pedal edema.  Gastrointestinal system: Abdomen is nondistended, soft and nontender. Normal bowel sounds heard.  Central nervous system: Alert and oriented to person only. No focal neurological deficits.  Extremities: Symmetric 5 x 5 power.  Discharge Instructions      Discharge Instructions   Call MD for:  difficulty breathing, headache or visual disturbances    Complete by:  As directed      Call MD for:  extreme fatigue    Complete by:  As directed      Call MD for:  persistant dizziness or light-headedness    Complete by:  As directed      Call MD for:    Complete by:  As directed   Worsening  confusion.     Diet - low sodium heart healthy    Complete by:  As directed      Diet Carb Modified    Complete by:  As directed      Discharge instructions    Complete by:  As directed   Continue oxygen via nasal cannula at 3 L per minute continuously.     Increase activity slowly    Complete by:  As directed             Medication List    STOP taking these medications       furosemide 20 MG tablet  Commonly known as:  LASIX     metFORMIN 1000 MG tablet  Commonly known as:  GLUCOPHAGE      TAKE these medications       budesonide 0.5 MG/2ML nebulizer solution  Commonly known as:  PULMICORT  Take 0.5 mg by nebulization daily as needed (for inflammation of lungs).     cholecalciferol 1000 UNITS tablet  Commonly known as:  VITAMIN D  Take 4,000 Units by mouth every morning.     ciprofloxacin 250 MG tablet  Commonly known as:  CIPRO  Take 1 tablet (250 mg total) by mouth every evening.     insulin glargine 100 UNIT/ML injection  Commonly known as:  LANTUS  Inject 0.05 mLs (5 Units total) into the skin at bedtime.     ipratropium-albuterol 0.5-2.5 (3) MG/3ML Soln  Commonly known as:  DUONEB  Take 3  mLs by nebulization every 6 (six) hours as needed (for inflammation of lung).     levothyroxine 125 MCG tablet  Commonly known as:  SYNTHROID, LEVOTHROID  Take 125 mcg by mouth daily before breakfast.     multivitamin with minerals Tabs tablet  Take 1 tablet by mouth daily.     OLANZapine 10 MG tablet  Commonly known as:  ZYPREXA  Take 10 mg by mouth at bedtime.     simvastatin 20 MG tablet  Commonly known as:  ZOCOR  Take 20 mg by mouth every evening.     sitaGLIPtin 100 MG tablet  Commonly known as:  JANUVIA  Take 0.5 tablets (50 mg total) by mouth daily.     ZENPEP 20000 UNITS Cpep  Generic drug:  Pancrelipase (Lip-Prot-Amyl)  Take 1-2 capsules by mouth 4 (four) times daily. 2 capsules three times a day and at bedtime take 1 capsule       Follow-up  Information   Follow up with ANDY,CAMILLE L, MD. Schedule an appointment as soon as possible for a visit in 3 days. (To be seen with repeat labs (CBC & BMP).)    Specialty:  Family Medicine   Contact information:   [place] [id number] [phone]       The results of significant diagnostics from this hospitalization (including imaging, microbiology, ancillary and laboratory) are listed below for reference.    Significant Diagnostic Studies: US Renal  05/12/2014   CLINICAL DATA:  Acute on chronic kidney disease. Rule out obstruction. Diabetic.  EXAM: RENAL/URINARY TRACT ULTRASOUND COMPLETE  COMPARISON:  10/01/2005 CT.  FINDINGS: Right Kidney:  Length: 10.4 cm. Slightly echogenic which may represent result of medical renal disease. No mass or hydronephrosis visualized.  Left Kidney:  Length: 9.9. Minimally lobulated. No mass or hydronephrosis visualized.  Bladder:  Appears normal for degree of bladder distention.  IMPRESSION: No evidence of hydronephrosis.  Right kidney appears slightly echogenic which may represent result of medical renal disease.   Electronically Signed   By: Chauncey Cruel M.D.   On: 05/12/2014 08:14   Dg Chest Port 1 View  05/08/2014   CLINICAL DATA:  Shortness of breath and cough  EXAM: PORTABLE CHEST - 1 VIEW  COMPARISON:  None  FINDINGS: Cardiac shadow is within normal limits. The lungs are well aerated bilaterally. No focal infiltrate or sizable effusion is seen. No bony abnormality is noted.  IMPRESSION: No acute abnormality noted.   Electronically Signed   By: Inez Catalina M.D.   On: 05/08/2014 20:59    Microbiology: Recent Results (from the past 240 hour(s))  URINE CULTURE     Status: None   Collection Time    05/11/14  6:11 AM      Result Value Ref Range Status   Specimen Description URINE, CLEAN CATCH   Final   Special Requests NONE   Final   Culture  Setup Time     Final   Value: 05/11/2014 16:07     Performed at [facility]     Final   Value: NO GROWTH     Performed at [facility]   Culture     Final   Value: NO GROWTH     Performed at [facility]   Report Status 05/12/2014 FINAL   Final     Labs: Basic Metabolic Panel:  Recent Labs Lab 05/08/14 1900 05/09/14 0120 05/10/14 8295 05/11/14 0535 05/12/14 0441  NA 138 138 142 145 141  K 4.9 4.1 4.9 4.3 4.4  CL 97 102 107 110 104  CO2 24 25 23 25 25   GLUCOSE 306* 178* 116* 53* 122*  BUN 51* 47* 34* 29* 25*  CREATININE 1.71* 1.54* 1.57* 1.63* 1.39*  CALCIUM 10.2 9.1 8.8 8.7 8.8   Liver Function Tests:  Recent Labs Lab 05/08/14 1900  AST 18  ALT 19  ALKPHOS 65  BILITOT <0.2*  PROT 7.7  ALBUMIN 3.8   No results found for this basename: LIPASE, AMYLASE,  in the last 168 hours No results found for this basename: AMMONIA,  in the last 168 hours CBC:  Recent Labs Lab 05/08/14 1900 05/09/14 0120 05/10/14 0618 05/11/14 0535  WBC 12.5* 10.4 15.4* 11.6*  NEUTROABS 6.1  --   --   --   HGB 12.5 10.3* 12.6 11.8*  HCT 38.3 31.7* 39.7 34.9*  MCV 93.9 94.3 96.8 95.6  PLT 203 172 180 151   Cardiac Enzymes: No results found for this basename: CKTOTAL, CKMB, CKMBINDEX, TROPONINI,  in the last 168 hours BNP: BNP (last 3 results) No results found for this basename: PROBNP,  in the last 8760 hours CBG:  Recent Labs Lab 05/11/14 1159 05/11/14 1638 05/11/14 2105 05/12/14 0717 05/12/14 1154  GLUCAP 176* 97 111* 105* 203*    Additional labs:   TSH: 5.6, free T4-1.11  Blood acetone: Negative   Signed:  Modena Jansky, MD, FACP, FHM. [facility] Pager [phone]  If 7PM-7AM, please contact night-coverage [email or web address] Password [email or web address] 05/12/2014, 2:15 PM

## 2014-05-12 NOTE — Progress Notes (Signed)
Inpatient Diabetes Program Recommendations  AACE/ADA: New Consensus Statement on Inpatient Glycemic Control (2013)  Target Ranges:  Prepandial:   less than 140 mg/dL      Peak postprandial:   less than 180 mg/dL (1-2 hours)      Critically ill patients:  140 - 180 mg/dL   Reason for Visit: Diabetes Consult   Diabetes history: DM2 Outpatient Diabetes medications: Januvia 100 mg QD, metformin 1000 mg bid, Lantus 64 units QHS (has not taken Lantus for weeks.) Current orders for Inpatient glycemic control: Lantus 5 units QHS, Novolog sensitive tidwc and hs  Received call from RN regarding "diabetes education" for pt's POA prior to discharge. Pt's friend has gone home and is unavailable for this Probation officer to speak with him. Patient has flat affect and disengaged. Psych MD documented that patient does not have capacity. Friend upset about SNF not being covered. RN reports friend is not allowed on the property of where pt lives.  Writer feels it is unsafe to send pt home on insulin when she lacks mental capacity. I did leave information on hypoglycemia, insulin administration and general diabetes information with pt.   Results for LADAWN, BOULLION (MRN [id number]) as of 05/12/2014 15:40  Ref. Range 05/11/2014 11:59 05/11/2014 16:38 05/11/2014 21:05 05/12/2014 07:17 05/12/2014 11:54  Glucose-Capillary Latest Range: 70-99 mg/dL 176 (H) 97 111 (H) 105 (H) 203 (H)  Results for DEEANDRA, JERRY (MRN [id number]) as of 05/12/2014 15:40  Ref. Range 05/12/2014 04:41  Sodium Latest Range: 137-147 mEq/L 141  Potassium Latest Range: 3.7-5.3 mEq/L 4.4  Chloride Latest Range: 96-112 mEq/L 104  CO2 Latest Range: 19-32 mEq/L 25  BUN Latest Range: 6-23 mg/dL 25 (H)  Creatinine Latest Range: 0.50-1.10 mg/dL 1.39 (H)  Calcium Latest Range: 8.4-10.5 mg/dL 8.8  GFR calc non Af Amer Latest Range: >90 mL/min 37 (L)  GFR calc Af Amer Latest Range: >90 mL/min 42 (L)  Glucose Latest Range: 70-99 mg/dL 122 (H)   Pt had mild  hypoglycemia on 5/17 am with Lantus 10 units. Lantus dose decreased to 5 units QHS. Patient will be discharged on a reduced Lantus 5 units each bedtime, reduced Januvia and adjusted to renal functions. Metformin to be discontinued as home med d/t renal insufficiency.   Discussed above with RN. Thank you. Lorenda Peck, RD, LDN, CDE Inpatient Diabetes Coordinator [phone]

## 2014-05-13 LAB — GLUCOSE, CAPILLARY: Glucose-Capillary: 77 mg/dL (ref 70–99)

## 2014-06-11 ENCOUNTER — Emergency Department (HOSPITAL_COMMUNITY): Payer: Medicare Other

## 2014-06-11 ENCOUNTER — Encounter (HOSPITAL_COMMUNITY): Payer: Self-pay | Admitting: Emergency Medicine

## 2014-06-11 ENCOUNTER — Inpatient Hospital Stay (HOSPITAL_COMMUNITY)
Admission: EM | Admit: 2014-06-11 | Discharge: 2014-06-15 | DRG: 178 | Disposition: A | Discharge status: Skilled Nursing Facility | Payer: Medicare Other | Attending: Internal Medicine | Admitting: Internal Medicine

## 2014-06-11 DIAGNOSIS — F03C0 Unspecified dementia, severe, without behavioral disturbance, psychotic disturbance, mood disturbance, and anxiety: Secondary | ICD-10-CM | POA: Diagnosis present

## 2014-06-11 DIAGNOSIS — E1169 Type 2 diabetes mellitus with other specified complication: Secondary | ICD-10-CM | POA: Diagnosis not present

## 2014-06-11 DIAGNOSIS — R911 Solitary pulmonary nodule: Secondary | ICD-10-CM | POA: Diagnosis present

## 2014-06-11 DIAGNOSIS — J690 Pneumonitis due to inhalation of food and vomit: Principal | ICD-10-CM | POA: Diagnosis present

## 2014-06-11 DIAGNOSIS — Z602 Problems related to living alone: Secondary | ICD-10-CM

## 2014-06-11 DIAGNOSIS — J449 Chronic obstructive pulmonary disease, unspecified: Secondary | ICD-10-CM | POA: Diagnosis present

## 2014-06-11 DIAGNOSIS — F319 Bipolar disorder, unspecified: Secondary | ICD-10-CM | POA: Diagnosis present

## 2014-06-11 DIAGNOSIS — Z9981 Dependence on supplemental oxygen: Secondary | ICD-10-CM

## 2014-06-11 DIAGNOSIS — Z794 Long term (current) use of insulin: Secondary | ICD-10-CM

## 2014-06-11 DIAGNOSIS — Z87891 Personal history of nicotine dependence: Secondary | ICD-10-CM

## 2014-06-11 DIAGNOSIS — N179 Acute kidney failure, unspecified: Secondary | ICD-10-CM | POA: Diagnosis present

## 2014-06-11 DIAGNOSIS — E7800 Pure hypercholesterolemia, unspecified: Secondary | ICD-10-CM | POA: Diagnosis present

## 2014-06-11 DIAGNOSIS — Z8673 Personal history of transient ischemic attack (TIA), and cerebral infarction without residual deficits: Secondary | ICD-10-CM

## 2014-06-11 DIAGNOSIS — N189 Chronic kidney disease, unspecified: Secondary | ICD-10-CM | POA: Diagnosis present

## 2014-06-11 DIAGNOSIS — N390 Urinary tract infection, site not specified: Secondary | ICD-10-CM | POA: Diagnosis present

## 2014-06-11 DIAGNOSIS — R0600 Dyspnea, unspecified: Secondary | ICD-10-CM

## 2014-06-11 DIAGNOSIS — R4181 Age-related cognitive decline: Secondary | ICD-10-CM | POA: Diagnosis present

## 2014-06-11 DIAGNOSIS — I129 Hypertensive chronic kidney disease with stage 1 through stage 4 chronic kidney disease, or unspecified chronic kidney disease: Secondary | ICD-10-CM | POA: Diagnosis present

## 2014-06-11 DIAGNOSIS — J438 Other emphysema: Secondary | ICD-10-CM | POA: Diagnosis present

## 2014-06-11 DIAGNOSIS — F0390 Unspecified dementia without behavioral disturbance: Secondary | ICD-10-CM | POA: Diagnosis present

## 2014-06-11 DIAGNOSIS — F1021 Alcohol dependence, in remission: Secondary | ICD-10-CM | POA: Diagnosis present

## 2014-06-11 DIAGNOSIS — E860 Dehydration: Secondary | ICD-10-CM | POA: Diagnosis not present

## 2014-06-11 DIAGNOSIS — R197 Diarrhea, unspecified: Secondary | ICD-10-CM | POA: Diagnosis present

## 2014-06-11 DIAGNOSIS — Z66 Do not resuscitate: Secondary | ICD-10-CM | POA: Diagnosis present

## 2014-06-11 DIAGNOSIS — R410 Disorientation, unspecified: Secondary | ICD-10-CM | POA: Diagnosis present

## 2014-06-11 DIAGNOSIS — E039 Hypothyroidism, unspecified: Secondary | ICD-10-CM | POA: Diagnosis present

## 2014-06-11 DIAGNOSIS — E119 Type 2 diabetes mellitus without complications: Secondary | ICD-10-CM | POA: Diagnosis present

## 2014-06-11 DIAGNOSIS — J9610 Chronic respiratory failure, unspecified whether with hypoxia or hypercapnia: Secondary | ICD-10-CM | POA: Diagnosis present

## 2014-06-11 DIAGNOSIS — F1020 Alcohol dependence, uncomplicated: Secondary | ICD-10-CM | POA: Diagnosis present

## 2014-06-11 DIAGNOSIS — J189 Pneumonia, unspecified organism: Secondary | ICD-10-CM | POA: Diagnosis present

## 2014-06-11 DIAGNOSIS — E785 Hyperlipidemia, unspecified: Secondary | ICD-10-CM | POA: Diagnosis present

## 2014-06-11 DIAGNOSIS — E873 Alkalosis: Secondary | ICD-10-CM | POA: Diagnosis present

## 2014-06-11 DIAGNOSIS — D649 Anemia, unspecified: Secondary | ICD-10-CM

## 2014-06-11 DIAGNOSIS — D509 Iron deficiency anemia, unspecified: Secondary | ICD-10-CM | POA: Diagnosis present

## 2014-06-11 DIAGNOSIS — E876 Hypokalemia: Secondary | ICD-10-CM | POA: Diagnosis present

## 2014-06-11 DIAGNOSIS — D508 Other iron deficiency anemias: Secondary | ICD-10-CM

## 2014-06-11 DIAGNOSIS — Z79899 Other long term (current) drug therapy: Secondary | ICD-10-CM

## 2014-06-11 DIAGNOSIS — Z91038 Other insect allergy status: Secondary | ICD-10-CM

## 2014-06-11 DIAGNOSIS — K7030 Alcoholic cirrhosis of liver without ascites: Secondary | ICD-10-CM | POA: Diagnosis present

## 2014-06-11 LAB — URINALYSIS, ROUTINE W REFLEX MICROSCOPIC
BILIRUBIN URINE: NEGATIVE
GLUCOSE, UA: NEGATIVE mg/dL
HGB URINE DIPSTICK: NEGATIVE
Ketones, ur: NEGATIVE mg/dL
Nitrite: NEGATIVE
PH: 6.5 (ref 5.0–8.0)
PROTEIN: NEGATIVE mg/dL
Specific Gravity, Urine: 1.006 (ref 1.005–1.030)
Urobilinogen, UA: 0.2 mg/dL (ref 0.0–1.0)

## 2014-06-11 LAB — CBC
HCT: 30.1 % — ABNORMAL LOW (ref 36.0–46.0)
Hemoglobin: 8.9 g/dL — ABNORMAL LOW (ref 12.0–15.0)
MCH: 29.5 pg (ref 26.0–34.0)
MCHC: 29.6 g/dL — ABNORMAL LOW (ref 30.0–36.0)
MCV: 99.7 fL (ref 78.0–100.0)
Platelets: 165 10*3/uL (ref 150–400)
RBC: 3.02 MIL/uL — ABNORMAL LOW (ref 3.87–5.11)
RDW: 12.4 % (ref 11.5–15.5)
WBC: 8.3 10*3/uL (ref 4.0–10.5)

## 2014-06-11 LAB — SAMPLE TO BLOOD BANK

## 2014-06-11 LAB — COMPREHENSIVE METABOLIC PANEL
ALT: 25 U/L (ref 0–35)
AST: 19 U/L (ref 0–37)
Albumin: 2.7 g/dL — ABNORMAL LOW (ref 3.5–5.2)
Alkaline Phosphatase: 65 U/L (ref 39–117)
BUN: 12 mg/dL (ref 6–23)
CALCIUM: 8.6 mg/dL (ref 8.4–10.5)
CO2: 36 mEq/L — ABNORMAL HIGH (ref 19–32)
Chloride: 96 mEq/L (ref 96–112)
Creatinine, Ser: 1.13 mg/dL — ABNORMAL HIGH (ref 0.50–1.10)
GFR calc Af Amer: 54 mL/min — ABNORMAL LOW (ref >90)
GFR calc non Af Amer: 47 mL/min — ABNORMAL LOW (ref >90)
Glucose, Bld: 111 mg/dL — ABNORMAL HIGH (ref 70–99)
Potassium: 3.9 mEq/L (ref 3.7–5.3)
SODIUM: 142 meq/L (ref 137–147)
TOTAL PROTEIN: 6.4 g/dL (ref 6.0–8.3)
Total Bilirubin: <0.2 mg/dL — ABNORMAL LOW (ref 0.3–1.2)

## 2014-06-11 LAB — PRO B NATRIURETIC PEPTIDE: Pro B Natriuretic peptide (BNP): 770.8 pg/mL — ABNORMAL HIGH (ref 0–125)

## 2014-06-11 LAB — TROPONIN I: Troponin I: <0.3 ng/mL (ref <0.30)

## 2014-06-11 LAB — URINE MICROSCOPIC-ADD ON

## 2014-06-11 LAB — PROTIME-INR
INR: 0.97 (ref 0.00–1.49)
PROTHROMBIN TIME: 12.7 s (ref 11.6–15.2)

## 2014-06-11 LAB — APTT: APTT: 31 s (ref 24–37)

## 2014-06-11 LAB — D-DIMER, QUANTITATIVE (NOT AT ARMC): D DIMER QUANT: 1.66 ug{FEU}/mL — AB (ref 0.00–0.48)

## 2014-06-11 LAB — MAGNESIUM: Magnesium: 1.7 mg/dL (ref 1.5–2.5)

## 2014-06-11 LAB — POC OCCULT BLOOD, ED: FECAL OCCULT BLD: NEGATIVE

## 2014-06-11 MED ORDER — ACETAMINOPHEN 325 MG PO TABS: 650.0000 mg | ORAL_TABLET | ORAL | Status: DC | PRN

## 2014-06-11 MED ORDER — DEXTROSE 5 % IV SOLN
1.0000 g | Freq: Two times a day (BID) | INTRAVENOUS | Status: DC
  Administered 2014-06-12: 1 g via INTRAVENOUS
  Filled 2014-06-11 (×2): qty 1

## 2014-06-11 MED ORDER — ONDANSETRON HCL 4 MG/2ML IJ SOLN: 4.0000 mg | Freq: Four times a day (QID) | INTRAMUSCULAR | Status: DC | PRN

## 2014-06-11 MED ORDER — IOHEXOL 350 MG/ML SOLN
100.0000 mL | Freq: Once | INTRAVENOUS | Status: AC | PRN
  Administered 2014-06-11: 100 mL via INTRAVENOUS

## 2014-06-11 MED ORDER — PANCRELIPASE (LIP-PROT-AMYL) 12000-38000 UNITS PO CPEP
1.0000 | ORAL_CAPSULE | Freq: Three times a day (TID) | ORAL | Status: DC
  Administered 2014-06-12 – 2014-06-15 (×7): 1 via ORAL
  Filled 2014-06-11 (×13): qty 1

## 2014-06-11 MED ORDER — VANCOMYCIN HCL 500 MG IV SOLR
500.0000 mg | Freq: Two times a day (BID) | INTRAVENOUS | Status: DC
  Filled 2014-06-11: qty 500

## 2014-06-11 MED ORDER — SODIUM CHLORIDE 0.9 % IJ SOLN
3.0000 mL | Freq: Two times a day (BID) | INTRAMUSCULAR | Status: DC
  Administered 2014-06-14: 3 mL via INTRAVENOUS

## 2014-06-11 MED ORDER — PIPERACILLIN-TAZOBACTAM 3.375 G IVPB: 3.3750 g | Freq: Once | INTRAVENOUS | Status: DC

## 2014-06-11 MED ORDER — LEVOTHYROXINE SODIUM 100 MCG PO TABS
100.0000 ug | ORAL_TABLET | Freq: Every day | ORAL | Status: DC
  Administered 2014-06-12 – 2014-06-15 (×4): 100 ug via ORAL
  Filled 2014-06-11 (×5): qty 1

## 2014-06-11 MED ORDER — HEPARIN SODIUM (PORCINE) 5000 UNIT/ML IJ SOLN
5000.0000 [IU] | Freq: Three times a day (TID) | INTRAMUSCULAR | Status: DC
  Administered 2014-06-12 – 2014-06-15 (×11): 5000 [IU] via SUBCUTANEOUS
  Filled 2014-06-11 (×14): qty 1

## 2014-06-11 MED ORDER — IPRATROPIUM-ALBUTEROL 0.5-2.5 (3) MG/3ML IN SOLN: 3.0000 mL | Freq: Four times a day (QID) | RESPIRATORY_TRACT | Status: DC | PRN

## 2014-06-11 MED ORDER — VANCOMYCIN HCL IN DEXTROSE 1-5 GM/200ML-% IV SOLN
1000.0000 mg | Freq: Once | INTRAVENOUS | Status: AC
  Administered 2014-06-11: 1000 mg via INTRAVENOUS
  Filled 2014-06-11: qty 200

## 2014-06-11 MED ORDER — VANCOMYCIN HCL 500 MG IV SOLR
500.0000 mg | Freq: Two times a day (BID) | INTRAVENOUS | Status: DC
  Administered 2014-06-12 – 2014-06-13 (×3): 500 mg via INTRAVENOUS
  Filled 2014-06-11 (×3): qty 500

## 2014-06-11 MED ORDER — IPRATROPIUM-ALBUTEROL 0.5-2.5 (3) MG/3ML IN SOLN
3.0000 mL | Freq: Four times a day (QID) | RESPIRATORY_TRACT | Status: DC
  Administered 2014-06-11 – 2014-06-13 (×8): 3 mL via RESPIRATORY_TRACT
  Filled 2014-06-11 (×9): qty 3

## 2014-06-11 MED ORDER — OLANZAPINE 10 MG PO TABS
10.0000 mg | ORAL_TABLET | Freq: Every day | ORAL | Status: DC
  Administered 2014-06-12 – 2014-06-14 (×3): 10 mg via ORAL
  Filled 2014-06-11 (×5): qty 1

## 2014-06-11 MED ORDER — SODIUM CHLORIDE 0.9 % IJ SOLN: 3.0000 mL | INTRAMUSCULAR | Status: DC | PRN

## 2014-06-11 MED ORDER — SIMVASTATIN 20 MG PO TABS
20.0000 mg | ORAL_TABLET | Freq: Every evening | ORAL | Status: DC
  Administered 2014-06-12 – 2014-06-14 (×4): 20 mg via ORAL
  Filled 2014-06-11 (×5): qty 1

## 2014-06-11 MED ORDER — VANCOMYCIN HCL 10 G IV SOLR: 1.0000 g | Freq: Once | INTRAVENOUS | Status: DC

## 2014-06-11 MED ORDER — INSULIN ASPART 100 UNIT/ML ~~LOC~~ SOLN
0.0000 [IU] | Freq: Three times a day (TID) | SUBCUTANEOUS | Status: DC
  Administered 2014-06-12 (×2): 3 [IU] via SUBCUTANEOUS
  Administered 2014-06-13: 7 [IU] via SUBCUTANEOUS

## 2014-06-11 MED ORDER — ALBUTEROL SULFATE (2.5 MG/3ML) 0.083% IN NEBU
5.0000 mg | INHALATION_SOLUTION | Freq: Once | RESPIRATORY_TRACT | Status: AC
  Administered 2014-06-11: 5 mg via RESPIRATORY_TRACT
  Filled 2014-06-11: qty 6

## 2014-06-11 MED ORDER — PANTOPRAZOLE SODIUM 40 MG PO TBEC
40.0000 mg | DELAYED_RELEASE_TABLET | Freq: Every day | ORAL | Status: DC
  Administered 2014-06-12 – 2014-06-15 (×4): 40 mg via ORAL
  Filled 2014-06-11 (×4): qty 1

## 2014-06-11 MED ORDER — PIPERACILLIN-TAZOBACTAM 3.375 G IVPB 30 MIN
3.3750 g | Freq: Once | INTRAVENOUS | Status: AC
  Administered 2014-06-11: 3.375 g via INTRAVENOUS
  Filled 2014-06-11: qty 50

## 2014-06-11 MED ORDER — SODIUM CHLORIDE 0.9 % IV SOLN
250.0000 mL | INTRAVENOUS | Status: DC | PRN
  Administered 2014-06-11: 250 mL via INTRAVENOUS

## 2014-06-11 MED ORDER — INSULIN GLARGINE 100 UNIT/ML ~~LOC~~ SOLN
15.0000 [IU] | Freq: Every day | SUBCUTANEOUS | Status: DC
  Administered 2014-06-12: 15 [IU] via SUBCUTANEOUS
  Filled 2014-06-11: qty 0.15

## 2014-06-11 MED ORDER — SODIUM CHLORIDE 0.9 % IV BOLUS (SEPSIS)
500.0000 mL | Freq: Once | INTRAVENOUS | Status: AC
  Administered 2014-06-11: 500 mL via INTRAVENOUS

## 2014-06-11 MED ORDER — IPRATROPIUM BROMIDE 0.02 % IN SOLN
0.5000 mg | Freq: Once | RESPIRATORY_TRACT | Status: AC
  Administered 2014-06-11: 0.5 mg via RESPIRATORY_TRACT
  Filled 2014-06-11: qty 2.5

## 2014-06-11 NOTE — ED Notes (Signed)
Pt. Attempted to use bed pan for urine but unsuccessful. 

## 2014-06-11 NOTE — ED Provider Notes (Signed)
CSN: [id number]     Arrival date & time 06/11/14  26 History   First MD Initiated Contact with Patient 06/11/14 1527     Chief Complaint  Patient presents with  . Shortness of Breath     (Consider location/radiation/quality/duration/timing/severity/associated sxs/prior Treatment) HPI Patient has a history of COPD. She states she had some mild shortness of breath yesterday however got worse today. She describes a mild cough that is dry. She denies fever or chills. She does not think she is having wheezing. She denies sore throat, rhinorrhea, nausea, or vomiting. She has been having mild diarrhea. She also describes mild swelling of her legs. She denies feeling thirsty, polydipsia. She states today when she used her nebulizer 2:30 PM it did not seem to help.  PCP Dr. Jonni Sanger  Past Medical History  Diagnosis Date  . Hypertension   . COPD (chronic obstructive pulmonary disease)   . Diabetes mellitus   . Elevated cholesterol   . History of ETOH abuse   . Hypothyroid   . Cirrhosis, alcoholic   . Psychosis   . Lymphoma     T cell  . Stroke     Occipital 2004  . Asthma    No past surgical history on file. No family history on file. History  Substance Use Topics  . Smoking status: Former Smoker -- 0.50 packs/day for 56 years    Types: Cigarettes    Quit date: 05/08/2013  . Smokeless tobacco: Never Used  . Alcohol Use: No     Comment: Sober since 80   Lives at home Lives alone Uses oxygen 3 L per minute nasal cannula, 24/7  OB History   Grav Para Term Preterm Abortions TAB SAB Ect Mult Living                 Review of Systems  All other systems reviewed and are negative.     Allergies  Bee venom  Home Medications   Prior to Admission medications   Medication Sig Start Date End Date Taking? Authorizing Provider  budesonide (PULMICORT) 0.5 MG/2ML nebulizer solution Take 0.5 mg by nebulization daily as needed (for inflammation of lungs).    Historical Provider, MD   cholecalciferol (VITAMIN D) 1000 UNITS tablet Take 4,000 Units by mouth every morning.    Historical Provider, MD  ciprofloxacin (CIPRO) 250 MG tablet Take 1 tablet (250 mg total) by mouth every evening. 05/12/14   Modena Jansky, MD  insulin glargine (LANTUS) 100 UNIT/ML injection Inject 0.05 mLs (5 Units total) into the skin at bedtime. 05/12/14   Modena Jansky, MD  ipratropium-albuterol (DUONEB) 0.5-2.5 (3) MG/3ML SOLN Take 3 mLs by nebulization every 6 (six) hours as needed (for inflammation of lung).    Historical Provider, MD  levothyroxine (SYNTHROID, LEVOTHROID) 125 MCG tablet Take 125 mcg by mouth daily before breakfast.    Historical Provider, MD  Multiple Vitamin (MULTIVITAMIN WITH MINERALS) TABS Take 1 tablet by mouth daily.    Historical Provider, MD  OLANZapine (ZYPREXA) 10 MG tablet Take 10 mg by mouth at bedtime.    Historical Provider, MD  Pancrelipase, Lip-Prot-Amyl, (ZENPEP) 20000 UNITS CPEP Take 1-2 capsules by mouth 4 (four) times daily. 2 capsules three times a day and at bedtime take 1 capsule    Historical Provider, MD  simvastatin (ZOCOR) 20 MG tablet Take 20 mg by mouth every evening.    Historical Provider, MD  sitaGLIPtin (JANUVIA) 100 MG tablet Take 0.5 tablets (50 mg total) by  mouth daily. 05/12/14   Modena Jansky, MD   BP 132/64  Pulse 83  Temp(Src) 100 F (37.8 C) (Rectal)  SpO2 99%  Vital signs normal   Physical Exam  Nursing note and vitals reviewed. Constitutional: She is oriented to person, place, and time.  Non-toxic appearance. She does not appear ill. No distress.  Elderly frail female  HENT:  Head: Normocephalic and atraumatic.  Right Ear: External ear normal.  Left Ear: External ear normal.  Nose: Nose normal. No mucosal edema or rhinorrhea.  Mouth/Throat: Oropharynx is clear and moist and mucous membranes are normal. No dental abscesses or uvula swelling.  Eyes: Conjunctivae and EOM are normal. Pupils are equal, round, and reactive to  light.  Neck: Normal range of motion and full passive range of motion without pain. Neck supple.  Cardiovascular: Normal rate, regular rhythm and normal heart sounds.  Exam reveals no gallop and no friction rub.   No murmur heard. Pulmonary/Chest: Effort normal. No respiratory distress. She has decreased breath sounds. She has no wheezes. She has no rhonchi. She has no rales. She exhibits no tenderness and no crepitus.  Abdominal: Soft. Normal appearance and bowel sounds are normal. She exhibits no distension. There is no tenderness. There is no rebound and no guarding.  Musculoskeletal: Normal range of motion. She exhibits edema. She exhibits no tenderness.  Moves all extremities well. Trace pitting edema bilaterally  Neurological: She is alert and oriented to person, place, and time. She has normal strength. No cranial nerve deficit.  Skin: Skin is warm, dry and intact. No rash noted. No erythema. No pallor.  Psychiatric: She has a normal mood and affect. Her speech is normal and behavior is normal. Her mood appears not anxious.    ED Course  Procedures (including critical care time)  Medications  vancomycin (VANCOCIN) IVPB 1000 mg/200 mL premix (1,000 mg Intravenous New Bag/Given 06/11/14 1752)  albuterol (PROVENTIL) (2.5 MG/3ML) 0.083% nebulizer solution 5 mg (5 mg Nebulization Given 06/11/14 1618)  ipratropium (ATROVENT) nebulizer solution 0.5 mg (0.5 mg Nebulization Given 06/11/14 1618)  sodium chloride 0.9 % bolus 500 mL (500 mLs Intravenous New Bag/Given 06/11/14 1751)  piperacillin-tazobactam (ZOSYN) IVPB 3.375 g (0 g Intravenous Stopped 06/11/14 1825)  iohexol (OMNIPAQUE) 350 MG/ML injection 100 mL (100 mLs Intravenous Contrast Given 06/11/14 1731)   After reviewing her chest x-ray patient was started on health care associated pneumonia antibiotics and she was admitted to the hospital in May for hyperglycemia. Patient had elevated d-dimer, CT angiography chest was done to rule out PE. The  CT angio chest verified her pneumonia.   Patient is noted to have a new anemia since she was admitted in May. Her Hemoccult was negative however. Patient also denies seeing blood in her bowel movements or having black bowel movements. Blood transfusion was not ordered at this time.   18:23 Dr Grandville Silos, admit to tele, team 8    Labs Review   Results for orders placed during the hospital encounter of 06/11/14  CBC      Result Value Ref Range   WBC 8.3  4.0 - 10.5 K/uL   RBC 3.02 (*) 3.87 - 5.11 MIL/uL   Hemoglobin 8.9 (*) 12.0 - 15.0 g/dL   HCT 30.1 (*) 36.0 - 46.0 %   MCV 99.7  78.0 - 100.0 fL   MCH 29.5  26.0 - 34.0 pg   MCHC 29.6 (*) 30.0 - 36.0 g/dL   RDW 12.4  11.5 - 15.5 %  Platelets 165  150 - 400 K/uL  COMPREHENSIVE METABOLIC PANEL      Result Value Ref Range   Sodium 142  137 - 147 mEq/L   Potassium 3.9  3.7 - 5.3 mEq/L   Chloride 96  96 - 112 mEq/L   CO2 36 (*) 19 - 32 mEq/L   Glucose, Bld 111 (*) 70 - 99 mg/dL   BUN 12  6 - 23 mg/dL   Creatinine, Ser 1.13 (*) 0.50 - 1.10 mg/dL   Calcium 8.6  8.4 - 10.5 mg/dL   Total Protein 6.4  6.0 - 8.3 g/dL   Albumin 2.7 (*) 3.5 - 5.2 g/dL   AST 19  0 - 37 U/L   ALT 25  0 - 35 U/L   Alkaline Phosphatase 65  39 - 117 U/L   Total Bilirubin <0.2 (*) 0.3 - 1.2 mg/dL   GFR calc non Af Amer 47 (*) >90 mL/min   GFR calc Af Amer 54 (*) >90 mL/min  TROPONIN I      Result Value Ref Range   Troponin I <0.30  <0.30 ng/mL  PRO B NATRIURETIC PEPTIDE      Result Value Ref Range   Pro B Natriuretic peptide (BNP) 770.8 (*) 0 - 125 pg/mL  D-DIMER, QUANTITATIVE      Result Value Ref Range   D-Dimer, Quant 1.66 (*) 0.00 - 0.48 ug/mL-FEU  APTT      Result Value Ref Range   aPTT 31  24 - 37 seconds  PROTIME-INR      Result Value Ref Range   Prothrombin Time 12.7  11.6 - 15.2 seconds   INR 0.97  0.00 - 1.49  POC OCCULT BLOOD, ED      Result Value Ref Range   Fecal Occult Bld NEGATIVE  NEGATIVE   Laboratory interpretation all normal  except anemia, mildly elevated BNP, elevated d-dimer, new metabolic alkalosis with CO2 of 35 which is normally 25     Imaging Review Dg Chest 2 View (if Patient Has Fever And/or Copd)  06/11/2014   CLINICAL DATA:  Shortness of Breath  EXAM: CHEST  2 VIEW  COMPARISON:  May 08, 2014  FINDINGS: There is underlying emphysematous change. There is patchy infiltrate in the right lower lobe. Elsewhere lungs are clear. Heart size is normal. Pulmonary vascularity is within normal limits. No adenopathy. There is evidence of old rib trauma on the left.  IMPRESSION: Right base infiltrate.  Underlying emphysema.   Electronically Signed   By: Lowella Grip M.D.   On: 06/11/2014 16:29   Ct Angio Chest W/cm &/or Wo Cm  06/11/2014   CLINICAL DATA:  Shortness of breath, elevated D-dimers.  EXAM: CT ANGIOGRAPHY CHEST WITH CONTRAST  TECHNIQUE: Multidetector CT imaging of the chest was performed using the standard protocol during bolus administration of intravenous contrast. Multiplanar CT image reconstructions and MIPs were obtained to evaluate the vascular anatomy.  CONTRAST:  169mL OMNIPAQUE IOHEXOL 350 MG/ML SOLN  COMPARISON:  Chest x-ray June 11, 2014  FINDINGS: There is no pulmonary embolus. There is no mediastinal or hilar lymphadenopathy. There are small mediastinal lymph nodes largest measures 6 mm in short axis in the aortopulmonary window, not pathologically enlarged by CT size criteria. The heart size is normal. There is no pericardial effusion. There is an 8.8 x 13.1 mm spiculated nodule in the right apex. There are emphysematous changes of both lungs. There is small area patchy consolidation of the lateral anterior right lower lobe.  There is no pleural effusion. The visualized upper abdominal structures are unremarkable. No focal discrete lytic or blastic lesion is identified within the bones. There mild degenerative joint changes of the spine.  Review of the MIP images confirms the above findings.   IMPRESSION: No pulmonary embolus.  Small area of patchy consolidation of the lateral anterior right upper lobe suspicious for developing pneumonia.  A 0.8 x 13.1 mm spiculated nodule in the right apex, neoplasm is not excluded. Further evaluation with PET/CT is recommended.   Electronically Signed   By: Abelardo Diesel M.D.   On: 06/11/2014 17:56     EKG Interpretation   Date/Time:  Wednesday June 11 2014 15:23:59 EDT Ventricular Rate:  82 PR Interval:  123 QRS Duration: 67 QT Interval:  344 QTC Calculation: 402 R Axis:   69 Text Interpretation:  Sinus rhythm Baseline wander Since last tracing 19 Jun 2013 Nonspecific ST abnormality Septal leads Confirmed by KNAPP  MD-I,  IVA ([id number]) on 06/11/2014 3:41:34 PM      MDM   Final diagnoses:  Healthcare-associated pneumonia  Anemia  Dyspnea   Plan admission   Rolland Porter, MD, Alanson Aly, MD 06/11/14 2208

## 2014-06-11 NOTE — Progress Notes (Signed)
ANTIBIOTIC CONSULT NOTE - INITIAL  Pharmacy Consult for Vancomycin Indication: pneumonia  Allergies  Allergen Reactions  . Bee Venom Anaphylaxis    Patient Measurements: Height: 5' 5.5" (166.4 cm) Weight: 137 lb 9.1 oz (62.4 kg) IBW/kg (Calculated) : 58.15   Vital Signs: Temp: 98.1 F (36.7 C) (06/17 2107) Temp src: Oral (06/17 2107) BP: 117/62 mmHg (06/17 2107) Pulse Rate: 71 (06/17 2107) Intake/Output from previous day:   Intake/Output from this shift:    Labs:  Recent Labs  06/11/14 1542  WBC 8.3  HGB 8.9*  PLT 165  CREATININE 1.13*   Estimated Creatinine Clearance: 40.7 ml/min (by C-G formula based on Cr of 1.13). No results found for this basename: VANCOTROUGH, VANCOPEAK, VANCORANDOM, GENTTROUGH, GENTPEAK, GENTRANDOM, TOBRATROUGH, TOBRAPEAK, TOBRARND, AMIKACINPEAK, AMIKACINTROU, AMIKACIN,  in the last 72 hours   Microbiology: No results found for this or any previous visit (from the past 720 hour(s)).  Medical History: Past Medical History  Diagnosis Date  . Hypertension   . COPD (chronic obstructive pulmonary disease)   . Diabetes mellitus   . Elevated cholesterol   . History of ETOH abuse   . Hypothyroid   . Cirrhosis, alcoholic   . Psychosis   . Lymphoma     T cell  . Stroke     Occipital 2004  . Asthma     Medications:  Scheduled:  . ceFEPime (MAXIPIME) IV  1 g Intravenous Q12H  . heparin  5,000 Units Subcutaneous 3 times per day  . [START ON 06/12/2014] insulin aspart  0-9 Units Subcutaneous TID WC  . insulin glargine  15 Units Subcutaneous QHS  . ipratropium-albuterol  3 mL Nebulization Q6H  . [START ON 06/12/2014] levothyroxine  100 mcg Oral QAC breakfast  . [START ON 06/12/2014] lipase/protease/amylase  1 capsule Oral TID WC  . OLANZapine  10 mg Oral QHS  . [START ON 06/12/2014] pantoprazole  40 mg Oral Daily  . simvastatin  20 mg Oral QPM  . sodium chloride  3 mL Intravenous Q12H  . [START ON 06/12/2014] vancomycin  500 mg Intravenous  Q12H   Infusions:   Assessment: 73 yo with chronic respiratory failure on home O2 3-4 L, COPD, hypertension, diabetes, hyperlipidemia, hypothyroidism, history of CVA in 2004, prior history of alcoholic cirrhosis presenting to the ED with a one to two-day history of worsening shortness of breath at rest and on exertion. Vancomycin and Cefepime per Rx for HAP.    Goal of Therapy:  Vancomycin trough level 15-20 mcg/ml  Plan:   Cefepime 1Gm IV q12h  Vancomycin 500mg  IV q12h  F/U SCr/levels/cultures as needed  Lawana Pai R 06/11/2014,10:26 PM

## 2014-06-11 NOTE — H&P (Signed)
[facility] History and Physical  [name] MCN:[id number] DOB: 1941-12-19 DOA: 06/11/2014  Referring physician: Dr. Rolland Porter PCP: [name], MD   Chief Complaint: Shortness of breath  HPI: [name] is a 73 y.o. female  With history of chronic respiratory failure on home O2 3-4 L, COPD, hypertension, diabetes, hyperlipidemia, hypothyroidism, history of CVA in 2004, prior history of alcoholic cirrhosis presenting to the ED with a one to two-day history of worsening shortness of breath at rest and on exertion. Patient does endorse some generalized weakness as well. Patient denies any fevers, no chills, no cough, no nausea, no vomiting, no abdominal pain, no dysuria, no lower extremity edema, no weight gain, no diarrhea, no wheezing, no orthopnea, no paroxysmal nocturnal dyspnea, no chest pain, no dysuria, no melena, no hematemesis, no hematochezia. Patient was seen in the emergency room chest x-ray which was obtained to the right base infiltrate and emphysema. Compressive metabolic profile obtain a bicarbonate of 36 creatinine 1.13 albumin of 2.7 otherwise was within normal limits. First set of troponin was negative. Pro BNP was 770.8. CBC had a hemoglobin of 8.9 otherwise was within normal limits. D-dimer was elevated at 1.66. CT angiogram of the chest which was done was negative for PE however did show a small area of patchy consolidation in the lateral anterior right upper lobe suspicious for developing pneumonia. Also showed a 0.8 x 13.1 mm spiculated nodule in the right apex. Urinalysis was not done. We were called to admit the patient for further evaluation and management.   Review of Systems: As per history of present illness otherwise negative. Constitutional:  No weight loss, night sweats, Fevers, chills, fatigue.  HEENT:  No headaches, Difficulty swallowing,Tooth/dental problems,Sore throat,  No sneezing, itching, ear ache, nasal congestion, post nasal  drip,  Cardio-vascular:  No chest pain, Orthopnea, PND, swelling in lower extremities, anasarca, dizziness, palpitations  GI:  No heartburn, indigestion, abdominal pain, nausea, vomiting, diarrhea, change in bowel habits, loss of appetite  Resp:  No shortness of breath with exertion or at rest. No excess mucus, no productive cough, No non-productive cough, No coughing up of blood.No change in color of mucus.No wheezing.No chest wall deformity  Skin:  no rash or lesions.  GU:  no dysuria, change in color of urine, no urgency or frequency. No flank pain.  Musculoskeletal:  No joint pain or swelling. No decreased range of motion. No back pain.  Psych:  No change in mood or affect. No depression or anxiety. No memory loss.   Past Medical History  Diagnosis Date  . Hypertension   . COPD (chronic obstructive pulmonary disease)   . Diabetes mellitus   . Elevated cholesterol   . History of ETOH abuse   . Hypothyroid   . Cirrhosis, alcoholic   . Psychosis   . Lymphoma     T cell  . Stroke     Occipital 2004  . Asthma    No past surgical history on file. Social History:  reports that she quit smoking about 13 months ago. Her smoking use included Cigarettes. She has a 28 pack-year smoking history. She has never used smokeless tobacco. She reports that she does not drink alcohol or use illicit drugs.  Allergies  Allergen Reactions  . Bee Venom Anaphylaxis    No family history on file.   Prior to Admission medications   Medication Sig Start Date End Date Taking? Authorizing Provider  budesonide (PULMICORT) 0.5 MG/2ML nebulizer solution Take 0.5 mg  by nebulization daily as needed (for inflammation of lungs).   Yes Historical Provider, MD  cholecalciferol (VITAMIN D) 1000 UNITS tablet Take 4,000 Units by mouth every morning.   Yes Historical Provider, MD  insulin glargine (LANTUS) 100 UNIT/ML injection Inject 15 Units into the skin at bedtime. 05/12/14  Yes Modena Jansky, MD    ipratropium-albuterol (DUONEB) 0.5-2.5 (3) MG/3ML SOLN Take 3 mLs by nebulization every 6 (six) hours as needed (for inflammation of lung).   Yes Historical Provider, MD  levothyroxine (SYNTHROID, LEVOTHROID) 100 MCG tablet Take 100 mcg by mouth daily before breakfast.   Yes Historical Provider, MD  Multiple Vitamin (MULTIVITAMIN WITH MINERALS) TABS Take 1 tablet by mouth daily.   Yes Historical Provider, MD  OLANZapine (ZYPREXA) 10 MG tablet Take 10 mg by mouth at bedtime.   Yes Historical Provider, MD  Pancrelipase, Lip-Prot-Amyl, (ZENPEP) 20000 UNITS CPEP Take 1-2 capsules by mouth 4 (four) times daily. 2 capsules in the morning, 2 capsules around lunchtime, 1 capsule in the afternoon, and 2 capsules at bedtime.   Yes Historical Provider, MD  simvastatin (ZOCOR) 20 MG tablet Take 20 mg by mouth every evening.   Yes Historical Provider, MD  sitaGLIPtin (JANUVIA) 100 MG tablet Take 0.5 tablets (50 mg total) by mouth daily. 05/12/14  Yes Modena Jansky, MD   Physical Exam: Filed Vitals:   06/11/14 1606  BP:   Pulse:   Temp: 100 F (37.8 C)    BP 132/64  Pulse 83  Temp(Src) 100 F (37.8 C) (Rectal)  SpO2 100%  General:  Appears calm and comfortable, laying a gurney speaking in full sentences in no acute cardiopulmonary distress. Eyes: PERRLA, EOMI, normal lids, irises & conjunctiva ENT: grossly normal hearing, lips & tongue Neck: no LAD, masses or thyromegaly Cardiovascular: RRR, no m/r/g. No LE edema. Telemetry: SR, no arrhythmias  Respiratory: Some coarse breath sounds. No wheezing.  Normal respiratory effort. Abdomen: soft, ntnd, positive bowel sounds, no rebound, no guarding. Skin: no rash or induration seen on limited exam Musculoskeletal: grossly normal tone BUE/BLE Psychiatric: grossly normal mood and affect, speech fluent and appropriate Neurologic: Alert and oriented x3. Cranial nerves II through XII are grossly intact. No focal deficits.           Labs on Admission:   Basic Metabolic Panel:  Recent Labs Lab 06/11/14 1542  NA 142  K 3.9  CL 96  CO2 36*  GLUCOSE 111*  BUN 12  CREATININE 1.13*  CALCIUM 8.6   Liver Function Tests:  Recent Labs Lab 06/11/14 1542  AST 19  ALT 25  ALKPHOS 65  BILITOT <0.2*  PROT 6.4  ALBUMIN 2.7*   No results found for this basename: LIPASE, AMYLASE,  in the last 168 hours No results found for this basename: AMMONIA,  in the last 168 hours CBC:  Recent Labs Lab 06/11/14 1542  WBC 8.3  HGB 8.9*  HCT 30.1*  MCV 99.7  PLT 165   Cardiac Enzymes:  Recent Labs Lab 06/11/14 1542  TROPONINI <0.30    BNP (last 3 results)  Recent Labs  06/11/14 1542  PROBNP 770.8*   CBG: No results found for this basename: GLUCAP,  in the last 168 hours  Radiological Exams on Admission: Dg Chest 2 View (if Patient Has Fever And/or Copd)  06/11/2014   CLINICAL DATA:  Shortness of Breath  EXAM: CHEST  2 VIEW  COMPARISON:  May 08, 2014  FINDINGS: There is underlying emphysematous change. There is  patchy infiltrate in the right lower lobe. Elsewhere lungs are clear. Heart size is normal. Pulmonary vascularity is within normal limits. No adenopathy. There is evidence of old rib trauma on the left.  IMPRESSION: Right base infiltrate.  Underlying emphysema.   Electronically Signed   By: Lowella Grip M.D.   On: 06/11/2014 16:29   Ct Angio Chest W/cm &/or Wo Cm  06/11/2014   CLINICAL DATA:  Shortness of breath, elevated D-dimers.  EXAM: CT ANGIOGRAPHY CHEST WITH CONTRAST  TECHNIQUE: Multidetector CT imaging of the chest was performed using the standard protocol during bolus administration of intravenous contrast. Multiplanar CT image reconstructions and MIPs were obtained to evaluate the vascular anatomy.  CONTRAST:  165mL OMNIPAQUE IOHEXOL 350 MG/ML SOLN  COMPARISON:  Chest x-ray June 11, 2014  FINDINGS: There is no pulmonary embolus. There is no mediastinal or hilar lymphadenopathy. There are small mediastinal lymph  nodes largest measures 6 mm in short axis in the aortopulmonary window, not pathologically enlarged by CT size criteria. The heart size is normal. There is no pericardial effusion. There is an 8.8 x 13.1 mm spiculated nodule in the right apex. There are emphysematous changes of both lungs. There is small area patchy consolidation of the lateral anterior right lower lobe. There is no pleural effusion. The visualized upper abdominal structures are unremarkable. No focal discrete lytic or blastic lesion is identified within the bones. There mild degenerative joint changes of the spine.  Review of the MIP images confirms the above findings.  IMPRESSION: No pulmonary embolus.  Small area of patchy consolidation of the lateral anterior right upper lobe suspicious for developing pneumonia.  A 0.8 x 13.1 mm spiculated nodule in the right apex, neoplasm is not excluded. Further evaluation with PET/CT is recommended.   Electronically Signed   By: Abelardo Diesel M.D.   On: 06/11/2014 17:56    EKG: Independently reviewed. Normal sinus rhythm  Assessment/Plan Principal Problem:   HCAP (healthcare-associated pneumonia) Active Problems:   Anemia   HYPOTHYROIDISM   DIABETES MELLITUS, TYPE II   DYSLIPIDEMIA   BIPOLAR AFFECTIVE DISORDER   RESPIRATORY FAILURE, CHRONIC   COPD (chronic obstructive pulmonary disease)  #1 healthcare associated pneumonia Patient presented with worsening shortness of breath over one to 2 days. Chest x-ray CT of the chest consistent with a developing pneumonia. Patient currently afebrile. Patient recently hospitalized last month secondary to dehydration and acute on chronic kidney disease. Will admit the patient to telemetry. Will check a sputum Gram stain and culture. Check a urine Legionella antigen. Check a urine pneumococcus antigen. Check blood cultures x2. Placed empirically on IV vancomycin IV cefepime. Nebulizer treatments. Follow.  #2 anemia Patient noted to have a hemoglobin of  8.9 which is decreased from 11.8 on 05/11/2014. Patient denies any overt GI bleed. Fecal occult blood was negative. Check an anemia panel. Follow H&H. Transfusion threshold hemoglobin less than 7. Will consult with GI, Dr. Collene Mares in the morning.  #3 hypothyroidism Continue home dose Synthroid.  #4 type 2 diabetes mellitus Hemoglobin A1c was 6.8 on 05/09/2014. Will place on a sliding scale insulin. Continue home dose Lantus. Hold oral hypoglycemic agents.  #5 chronic respiratory failure on home O2/COPD Stable. Will place on nebulizer treatments. Resume home regimen of Pulmicort.  #6 bipolar affective disorder/dementia Stable. Continue home regimen of Zyprexa.  #7 hyperlipidemia Continue home dose Zocor.  #8 pulmonary nodule noted on CT chest Will treat current acute healthcare associated pneumonia. Will likely need outpatient followup.  #9 history of  alcoholic cirrhosis/from alcohol abuse Currently stable. Monitor closely.  #10 prophylaxis Protonix for GI prophylaxis. Heparin for DVT prophylaxis.  Code Status: Full Family Communication: Updated patient and caregiver at bedside. Disposition Plan: Admit to telemetry  Time spent: 57 minutes  [facility] M.D. [facility] Pager [phone]  **Disclaimer: This note may have been dictated with voice recognition software. Similar sounding words can inadvertently be transcribed and this note may contain transcription errors which may not have been corrected upon publication of note.**

## 2014-06-11 NOTE — ED Notes (Addendum)
Pt here from home with caregiver.  RA sats of 80%.  Normally on 4 L O2 at home.  Did not bring her oxygen tank with her because "they live close".  Caregiver became angry with this Probation officer when I asked him what was wrong.  He states "can you give me a second?! You need to let me sit down first!".  Started feeling SOB yesterday.  Got a treatment before coming.

## 2014-06-12 ENCOUNTER — Encounter (HOSPITAL_COMMUNITY): Payer: Self-pay

## 2014-06-12 DIAGNOSIS — E876 Hypokalemia: Secondary | ICD-10-CM

## 2014-06-12 DIAGNOSIS — D508 Other iron deficiency anemias: Secondary | ICD-10-CM

## 2014-06-12 LAB — CBC WITH DIFFERENTIAL/PLATELET
BASOS PCT: 0 % (ref 0–1)
Basophils Absolute: 0 10*3/uL (ref 0.0–0.1)
Eosinophils Absolute: 0.4 10*3/uL (ref 0.0–0.7)
Eosinophils Relative: 6 % — ABNORMAL HIGH (ref 0–5)
HCT: 29.7 % — ABNORMAL LOW (ref 36.0–46.0)
HEMOGLOBIN: 8.8 g/dL — AB (ref 12.0–15.0)
Lymphocytes Relative: 44 % (ref 12–46)
Lymphs Abs: 3.3 10*3/uL (ref 0.7–4.0)
MCH: 29.2 pg (ref 26.0–34.0)
MCHC: 29.6 g/dL — ABNORMAL LOW (ref 30.0–36.0)
MCV: 98.7 fL (ref 78.0–100.0)
MONOS PCT: 8 % (ref 3–12)
Monocytes Absolute: 0.6 10*3/uL (ref 0.1–1.0)
NEUTROS ABS: 3.1 10*3/uL (ref 1.7–7.7)
NEUTROS PCT: 42 % — AB (ref 43–77)
Platelets: 149 10*3/uL — ABNORMAL LOW (ref 150–400)
RBC: 3.01 MIL/uL — AB (ref 3.87–5.11)
RDW: 12.5 % (ref 11.5–15.5)
WBC: 7.4 10*3/uL (ref 4.0–10.5)

## 2014-06-12 LAB — VITAMIN B12: VITAMIN B 12: 297 pg/mL (ref 211–911)

## 2014-06-12 LAB — COMPREHENSIVE METABOLIC PANEL
ALT: 20 U/L (ref 0–35)
AST: 15 U/L (ref 0–37)
Albumin: 2.6 g/dL — ABNORMAL LOW (ref 3.5–5.2)
Alkaline Phosphatase: 54 U/L (ref 39–117)
BUN: 12 mg/dL (ref 6–23)
CALCIUM: 8.8 mg/dL (ref 8.4–10.5)
CO2: 35 meq/L — AB (ref 19–32)
CREATININE: 1.11 mg/dL — AB (ref 0.50–1.10)
Chloride: 99 mEq/L (ref 96–112)
GFR, EST AFRICAN AMERICAN: 56 mL/min — AB (ref >90)
GFR, EST NON AFRICAN AMERICAN: 48 mL/min — AB (ref >90)
GLUCOSE: 156 mg/dL — AB (ref 70–99)
Potassium: 2.9 mEq/L — CL (ref 3.7–5.3)
Sodium: 144 mEq/L (ref 137–147)
Total Bilirubin: <0.2 mg/dL — ABNORMAL LOW (ref 0.3–1.2)
Total Protein: 5.8 g/dL — ABNORMAL LOW (ref 6.0–8.3)

## 2014-06-12 LAB — IRON AND TIBC
Iron: 15 ug/dL — ABNORMAL LOW (ref 42–135)
Saturation Ratios: 7 % — ABNORMAL LOW (ref 20–55)
TIBC: 214 ug/dL — AB (ref 250–470)
UIBC: 199 ug/dL (ref 125–400)

## 2014-06-12 LAB — URINE CULTURE
CULTURE: NO GROWTH
Colony Count: NO GROWTH

## 2014-06-12 LAB — GLUCOSE, CAPILLARY
GLUCOSE-CAPILLARY: 92 mg/dL (ref 70–99)
GLUCOSE-CAPILLARY: 204 mg/dL — AB (ref 70–99)
Glucose-Capillary: 13 mg/dL — CL (ref 70–99)
Glucose-Capillary: 163 mg/dL — ABNORMAL HIGH (ref 70–99)
Glucose-Capillary: 149 mg/dL — ABNORMAL HIGH (ref 70–99)
Glucose-Capillary: 232 mg/dL — ABNORMAL HIGH (ref 70–99)
Glucose-Capillary: 106 mg/dL — ABNORMAL HIGH (ref 70–99)

## 2014-06-12 LAB — STREP PNEUMONIAE URINARY ANTIGEN: STREP PNEUMO URINARY ANTIGEN: NEGATIVE

## 2014-06-12 LAB — LEGIONELLA ANTIGEN, URINE: Legionella Antigen, Urine: NEGATIVE

## 2014-06-12 LAB — HIV ANTIBODY (ROUTINE TESTING W REFLEX): HIV: NONREACTIVE

## 2014-06-12 LAB — FOLATE

## 2014-06-12 LAB — FERRITIN: Ferritin: 41 ng/mL (ref 10–291)

## 2014-06-12 MED ORDER — DEXTROSE 50 % IV SOLN
INTRAVENOUS | Status: AC
  Filled 2014-06-12: qty 50

## 2014-06-12 MED ORDER — INSULIN GLARGINE 100 UNIT/ML ~~LOC~~ SOLN
10.0000 [IU] | Freq: Every day | SUBCUTANEOUS | Status: DC
Start: 2014-06-12 — End: 2014-06-12

## 2014-06-12 MED ORDER — PIPERACILLIN-TAZOBACTAM 3.375 G IVPB
3.3750 g | Freq: Three times a day (TID) | INTRAVENOUS | Status: DC
  Administered 2014-06-12 – 2014-06-15 (×9): 3.375 g via INTRAVENOUS
  Filled 2014-06-12 (×10): qty 50

## 2014-06-12 MED ORDER — POTASSIUM CHLORIDE CRYS ER 20 MEQ PO TBCR
30.0000 meq | EXTENDED_RELEASE_TABLET | ORAL | Status: AC
  Administered 2014-06-12 (×2): 30 meq via ORAL
  Filled 2014-06-12 (×2): qty 1

## 2014-06-12 MED ORDER — DEXTROSE 50 % IV SOLN
50.0000 mL | Freq: Once | INTRAVENOUS | Status: AC | PRN
  Administered 2014-06-12: 50 mL via INTRAVENOUS

## 2014-06-12 MED ORDER — PNEUMOCOCCAL VAC POLYVALENT 25 MCG/0.5ML IJ INJ
0.5000 mL | INJECTION | INTRAMUSCULAR | Status: AC
  Administered 2014-06-13: 0.5 mL via INTRAMUSCULAR
  Filled 2014-06-12 (×2): qty 0.5

## 2014-06-12 MED ORDER — BIOTENE DRY MOUTH MT LIQD
15.0000 mL | Freq: Two times a day (BID) | OROMUCOSAL | Status: DC
  Administered 2014-06-12 – 2014-06-15 (×7): 15 mL via OROMUCOSAL

## 2014-06-12 NOTE — Progress Notes (Signed)
CRITICAL VALUE ALERT  Critical value received:  CBG 13  Date of notification:  06/12/14  Time of notification:  0411  Critical value read back:yes  Nurse who received alert:  Elizbeth Squires  MD notified (1st page):  Tylene Fantasia NP  Time of first page:  [phone]  Informed K Baltazar Najjar CBG was 13, treated with d50 50 ml and orange juice, and 15 minutes later CBG was 163.  Pt resting comfortably.  Vitals stable.  Will continue to monitor.  Iantha Fallen RN 5:48 AM 06/12/2014

## 2014-06-12 NOTE — Progress Notes (Signed)
PT Cancellation Note  Patient Details Name: [name] MRN: [id number] DOB: [birth year]   Cancelled Treatment:    Reason Eval/Treat Not Completed: Patient declined, no reason specified Pt declines OOB activity at this time.  Reported being cold so pt given 2 warmed blankets.   LEMYRE,KATHrine E 06/12/2014, 3:08 PM Carmelia Bake, PT, DPT 06/12/2014 Pager: [phone]

## 2014-06-12 NOTE — Progress Notes (Signed)
OT Cancellation Note  Patient Details Name: [name] MRN: [id number] DOB: 26-Nov-1941   Cancelled Treatment:    Reason Eval/Treat Not Completed: Fatigue/lethargy limiting ability to participate Noted some abnormal labs as well. Explained role of OT. Pt willing to work with OT at a different time.  Betsy Pries 06/12/2014, 12:23 PM

## 2014-06-12 NOTE — Progress Notes (Signed)
CRITICAL VALUE ALERT  Critical value received:  Potassium 2.9  Date of notification:  06/12/14  Time of notification:  1121 from [place] value read back:yes  Nurse who received alert:  Elizbeth Squires RN  MD notified (1st page): Tylene Fantasia NP  Time of first page:  0552  Tylene Fantasia ordered PO Potassium - see MAR for more details.  Iantha Fallen RN 8:02 AM 06/12/2014

## 2014-06-12 NOTE — Progress Notes (Signed)
[facility] PROGRESS NOTE  [name] OVF:[id number] DOB: 03-Apr-1941 DOA: 06/11/2014 PCP: [name], MD   Brief narrative 73 year old female with chronic respiratory failure on home O2, advanced dementia, COPD, hypertension, diabetes mellitus, hypothyroidism, history of CVA and prior history of alcoholic cirrhosis presented with two-day history of worsening shortness of breath. Patient found to have right sided infiltrate likely due to aspiration pneumonia. Patient admitted for further management.  Assessment/Plan: Healthcare associated assess aspiration pneumonia On empiric vancomycin and Zosyn. Keep n.p.o. for swallow eval. The patient at high risk for aspiration. Swallow eval ordered. Follow cultures. Continue O2 via nasal cannula and nebulizers.  Type 2 diabetes mellitus A1c of 6.8. Had hypoglycemic episode this morning. Will hold Lantus and monitor on sliding scale insulin.  Anemia Noted to have about 2 gm drop in H&H from recent level. Check stool for occult blood. Has not had any GI w/up in past. Avoid NSAIDs. Monitor for now.  Hypokalemia Replenish with kcl   Chronic resp failure with COPD Continue o2 and nebs   bipolar affective disorder/dementia  . Continue home regimen of Zyprexa. Has advanced dementia. Check ammonia level  UTI  on empiric abx. Check cx    hyperlipidemia  Continue home dose Zocor.    pulmonary nodule noted on CT chest  Will treat current acute healthcare associated pneumonia. Will likely need outpatient followup.    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 Monitor    DVT prophylaxis. : sq heparin      Code Status: DNR Family Communication: friend and POA david Dye at bedside. Discussed GOC with him. Wants pt to be DNR and have a placed in SNF. PT eval and SW consulted. Pt will need palliative care evaluation as SNF  Disposition Plan: possibly  SNF   Consultants:  None  Procedures:  None  Antibiotics:  IV vancomycin and Zosyn  HPI/Subjective: Patient seen and examined this morning.  appears very confused  Objective: Filed Vitals:   06/12/14 0429  BP: 108/58  Pulse: 72  Temp: 97.4 F (36.3 C)  Resp: 24    Intake/Output Summary (Last 24 hours) at 06/12/14 1145 Last data filed at 06/12/14 0738  Gross per 24 hour  Intake 814.17 ml  Output    575 ml  Net 239.17 ml   Filed Weights   06/11/14 2107  Weight: 62.4 kg (137 lb 9.1 oz)    Exam:   General:  Elderly female in bed in NAD  HEENT: no pallor, moist mucosa  Chest: coarse breath sounds b/l with crackles  Cardiovascular: NS1&S2 , no murmurs  Abd: soft, NT, ND, BS+  Ext: warm, no  edema  CNS: AAOX1,tremulous  Data Reviewed: Basic Metabolic Panel:  Recent Labs Lab 06/11/14 1542 06/11/14 2200 06/12/14 0446  NA 142  --  144  K 3.9  --  2.9*  CL 96  --  99  CO2 36*  --  35*  GLUCOSE 111*  --  156*  BUN 12  --  12  CREATININE 1.13*  --  1.11*  CALCIUM 8.6  --  8.8  MG  --  1.7  --    Liver Function Tests:  Recent Labs Lab 06/11/14 1542 06/12/14 0446  AST 19 15  ALT 25 20  ALKPHOS 65 54  BILITOT <0.2* <0.2*  PROT 6.4 5.8*  ALBUMIN 2.7* 2.6*   No results found for this basename: LIPASE, AMYLASE,  in the last 168 hours No results found for this basename:  AMMONIA,  in the last 168 hours CBC:  Recent Labs Lab 06/11/14 1542 06/12/14 0446  WBC 8.3 7.4  NEUTROABS  --  3.1  HGB 8.9* 8.8*  HCT 30.1* 29.7*  MCV 99.7 98.7  PLT 165 149*   Cardiac Enzymes:  Recent Labs Lab 06/11/14 1542  TROPONINI <0.30   BNP (last 3 results)  Recent Labs  06/11/14 1542  PROBNP 770.8*   CBG:  Recent Labs Lab 06/11/14 2214 06/12/14 0411 06/12/14 0431 06/12/14 0739  GLUCAP 92 13* 163* 149*    Recent Results (from the past 240 hour(s))  CULTURE, BLOOD (ROUTINE X 2)     Status: None   Collection Time    06/11/14  7:05 PM       Result Value Ref Range Status   Specimen Description BLOOD LEFT HAND   Final   Special Requests BOTTLES DRAWN AEROBIC AND ANAEROBIC 2.5ML   Final   Culture  Setup Time     Final   Value: 06/11/2014 22:49     Performed at [facility]   Culture     Final   Value:        BLOOD CULTURE RECEIVED NO GROWTH TO DATE CULTURE WILL BE HELD FOR 5 DAYS BEFORE ISSUING A FINAL NEGATIVE REPORT     Performed at [facility]   Report Status PENDING   Incomplete  CULTURE, BLOOD (ROUTINE X 2)     Status: None   Collection Time    06/11/14  7:11 PM      Result Value Ref Range Status   Specimen Description BLOOD LEFT HAND    Final   Special Requests BOTTLES DRAWN AEROBIC AND ANAEROBIC 3ML   Final   Culture  Setup Time     Final   Value: 06/11/2014 22:48     Performed at [facility]   Culture     Final   Value:        BLOOD CULTURE RECEIVED NO GROWTH TO DATE CULTURE WILL BE HELD FOR 5 DAYS BEFORE ISSUING A FINAL NEGATIVE REPORT     Performed at [facility]   Report Status PENDING   Incomplete     Studies: Dg Chest 2 View (if Patient Has Fever And/or Copd)  06/11/2014   CLINICAL DATA:  Shortness of Breath  EXAM: CHEST  2 VIEW  COMPARISON:  May 08, 2014  FINDINGS: There is underlying emphysematous change. There is patchy infiltrate in the right lower lobe. Elsewhere lungs are clear. Heart size is normal. Pulmonary vascularity is within normal limits. No adenopathy. There is evidence of old rib trauma on the left.  IMPRESSION: Right base infiltrate.  Underlying emphysema.   Electronically Signed   By: Lowella Grip M.D.   On: 06/11/2014 16:29   Ct Angio Chest W/cm &/or Wo Cm  06/11/2014   CLINICAL DATA:  Shortness of breath, elevated D-dimers.  EXAM: CT ANGIOGRAPHY CHEST WITH CONTRAST  TECHNIQUE: Multidetector CT imaging of the chest was performed using the standard protocol during bolus administration of intravenous contrast. Multiplanar CT image reconstructions and  MIPs were obtained to evaluate the vascular anatomy.  CONTRAST:  180mL OMNIPAQUE IOHEXOL 350 MG/ML SOLN  COMPARISON:  Chest x-ray June 11, 2014  FINDINGS: There is no pulmonary embolus. There is no mediastinal or hilar lymphadenopathy. There are small mediastinal lymph nodes largest measures 6 mm in short axis in the aortopulmonary window, not pathologically enlarged by CT size criteria. The heart size is normal.  There is no pericardial effusion. There is an 8.8 x 13.1 mm spiculated nodule in the right apex. There are emphysematous changes of both lungs. There is small area patchy consolidation of the lateral anterior right lower lobe. There is no pleural effusion. The visualized upper abdominal structures are unremarkable. No focal discrete lytic or blastic lesion is identified within the bones. There mild degenerative joint changes of the spine.  Review of the MIP images confirms the above findings.  IMPRESSION: No pulmonary embolus.  Small area of patchy consolidation of the lateral anterior right upper lobe suspicious for developing pneumonia.  A 0.8 x 13.1 mm spiculated nodule in the right apex, neoplasm is not excluded. Further evaluation with PET/CT is recommended.   Electronically Signed   By: Abelardo Diesel M.D.   On: 06/11/2014 17:56    Scheduled Meds: . antiseptic oral rinse  15 mL Mouth Rinse BID  . dextrose      . heparin  5,000 Units Subcutaneous 3 times per day  . insulin aspart  0-9 Units Subcutaneous TID WC  . ipratropium-albuterol  3 mL Nebulization Q6H  . levothyroxine  100 mcg Oral QAC breakfast  . lipase/protease/amylase  1 capsule Oral TID WC  . OLANZapine  10 mg Oral QHS  . pantoprazole  40 mg Oral Daily  . piperacillin-tazobactam (ZOSYN)  IV  3.375 g Intravenous Q8H  . [START ON 06/13/2014] pneumococcal 23 valent vaccine  0.5 mL Intramuscular Tomorrow-1000  . simvastatin  20 mg Oral QPM  . sodium chloride  3 mL Intravenous Q12H  . vancomycin  500 mg Intravenous Q12H    Continuous Infusions:     Time spent: 25 minutes    DHUNGEL, Lily  [facility] Pager [phone] If 7PM-7AM, please contact night-coverage at [email or web address], password [email or web address] 06/12/2014, 11:45 AM  LOS: 1 day

## 2014-06-12 NOTE — Progress Notes (Signed)
SLP Cancellation Note  Patient Details Name: PATRIA WARZECHA MRN: [id number] DOB: 1941-09-08   Cancelled treatment:        SLP spoke with RN, who has been in communication with POA re: pending bedside swallow evaluation. Pt resting in bed, responsive to voice. Pt politely refused participation in swallow eval at this time, stating "I'm tired". SLP explained purpose of visit, to which pt responded that she didn't have problems swallowing, and wasn't hungry - just tired. Pt indicated willingness to work with SLP tomorrow.  RN notified.  Celia B. Quentin Ore [facility], CCC-SLP [phone]  Shonna Chock 06/12/2014, 12:59 PM

## 2014-06-12 NOTE — Progress Notes (Signed)
ANTIBIOTIC CONSULT NOTE - FOLLOW UP  Pharmacy Consult for Vancomycin, Zosyn Indication: rule out pneumonia (aspiration)  Allergies  Allergen Reactions  . Bee Venom Anaphylaxis    Patient Measurements: Height: 5' 5.5" (166.4 cm) Weight: 137 lb 9.1 oz (62.4 kg) IBW/kg (Calculated) : 58.15  Vital Signs: Temp: 97.4 F (36.3 C) (06/18 0429) Temp src: Oral (06/18 0429) BP: 108/58 mmHg (06/18 0429) Pulse Rate: 72 (06/18 0429) Intake/Output from previous day: 06/17 0701 - 06/18 0700 In: 768 [P.O.:580; I.V.:38; IV Piggyback:150] Out: 575 [Urine:575]  Labs:  Recent Labs  06/11/14 1542 06/12/14 0446  WBC 8.3 7.4  HGB 8.9* 8.8*  PLT 165 149*  CREATININE 1.13* 1.11*   Estimated Creatinine Clearance: 41.5 ml/min (by C-G formula based on Cr of 1.11).  Microbiology: Recent Results (from the past 720 hour(s))  CULTURE, BLOOD (ROUTINE X 2)     Status: None   Collection Time    06/11/14  7:05 PM      Result Value Ref Range Status   Specimen Description BLOOD LEFT HAND   Final   Special Requests BOTTLES DRAWN AEROBIC AND ANAEROBIC 2.5ML   Final   Culture  Setup Time     Final   Value: 06/11/2014 22:49     Performed at [facility]   Culture     Final   Value:        BLOOD CULTURE RECEIVED NO GROWTH TO DATE CULTURE WILL BE HELD FOR 5 DAYS BEFORE ISSUING A FINAL NEGATIVE REPORT     Performed at [facility]   Report Status PENDING   Incomplete  CULTURE, BLOOD (ROUTINE X 2)     Status: None   Collection Time    06/11/14  7:11 PM      Result Value Ref Range Status   Specimen Description BLOOD LEFT HAND    Final   Special Requests BOTTLES DRAWN AEROBIC AND ANAEROBIC 3ML   Final   Culture  Setup Time     Final   Value: 06/11/2014 22:48     Performed at [facility]   Culture     Final   Value:        BLOOD CULTURE RECEIVED NO GROWTH TO DATE CULTURE WILL BE HELD FOR 5 DAYS BEFORE ISSUING A FINAL NEGATIVE REPORT     Performed at [facility]   Report Status PENDING   Incomplete    Anti-infectives: 6/17 >> zosyn x1 6/17 >> cefepime  >>  6/18 6/17 >> vancomycin >> 6/18 >> Zosyn >>  Assessment: 29 yoF with hx of  chronic respiratory failure on home O2 3-4 L, COPD, hypertension, diabetes, hyperlipidemia, hypothyroidism, history of CVA in 2004, prior history of alcoholic cirrhosis presenting to the ED with a one to two-day history of worsening shortness of breath at rest and on exertion.  Pharmacy is consulted to dose Vancomycin and Zosyn.   6/18:  Day #2 vancomycin and cefepime.  Cefepime changed to Zosyn today for anaerobe coverage with suspected aspiration.  Tmax: 100  WBCs: 7.4  Renal: Scr 1.11, CrCl ~ 41 ml/min    Goal of Therapy:  Vancomycin trough level 15-20 mcg/ml Appropriate abx dosing, eradication of infection.  Plan:   Zosyn 3.375g IV Q8H infused over 4hrs.  Vancomycin 500mg  IV q12h.  Measure Vanc trough at steady state.  Follow up renal fxn and culture results.  Gretta Arab PharmD, BCPS Pager [phone] 06/12/2014 11:07 AM

## 2014-06-12 NOTE — Progress Notes (Signed)
Spoke briefly with patient about her diabetes.  Does not know how much insulin she takes.  States that "Shanon Brow" a friend, gives it to her. Not able to communicate other information. Was being treated by nurses at the time of the visit.  Harvel Ricks RN BSN CDE

## 2014-06-12 NOTE — Progress Notes (Signed)
INITIAL NUTRITION ASSESSMENT  DOCUMENTATION CODES Per approved criteria  -Not Applicable   INTERVENTION: -Diet advancement per SLP -Will continue to monitor  NUTRITION DIAGNOSIS: Inadequate oral intake related to inability to eat as evidenced by NPO status.   Goal: Pt to meet >/= 90% of their estimated nutrition needs    Monitor:  Diet order, swallow profile total protein/energy intake, labs, weights  Reason for Assessment: MST  73 y.o. female  Admitting Dx: HCAP (healthcare-associated pneumonia)  ASSESSMENT: [name] is a 73 y.o. female  With history of chronic respiratory failure on home O2 3-4 L, COPD, hypertension, diabetes, hyperlipidemia, hypothyroidism, history of CVA in 2004, prior history of alcoholic cirrhosis presenting to the ED with a one to two-day history of worsening shortness of breath at rest and on exertion. Patient does endorse some generalized weakness as well.  -Unable to attain detailed food/nutrition hx d/t pt's advanced dementia and lethargy -Pt confirmed poor PO intake pta; however was unable to elaborate when it began and did not provide diet recall. PO intake was 40% of breakfast 6/18 while on Carb Modified diet -MD noted pt at risk for aspiration, refused SLP evaluation. Denied dysphagia, endorsed decreased appetite and requested SLP evaluation at another time. Plan to re-attempt tomorrow. Will monitor PO intake and supplement as warranted -Pt unsure if any weight changes have occurred. Weight has fluctuated over past 2 years. Increased 15 lbs in one month per previous medical records -Pt with hx of alcoholic cirrhosis, which may have contributed to sub-optimal nutrition pta. Receiving pancreatic enzymes -CBG elevated, Lantus held d/t hypoglycemic episodes per MD note   Height: Ht Readings from Last 1 Encounters:  06/11/14 5' 5.5" (1.664 m)    Weight: Wt Readings from Last 1 Encounters:  06/11/14 137 lb 9.1 oz (62.4 kg)    Ideal  Body Weight: 125 lbs  % Ideal Body Weight: 110%  Wt Readings from Last 10 Encounters:  06/11/14 137 lb 9.1 oz (62.4 kg)  05/08/14 122 lb 12.7 oz (55.7 kg)  10/11/12 162 lb 0.6 oz (73.5 kg)  09/01/12 128 lb 14.4 oz (58.469 kg)  04/23/12 135 lb (61.236 kg)  07/25/08 161 lb 4 oz (73.143 kg)  08/07/07 162 lb (73.483 kg)  04/30/07 184 lb 1.6 oz (83.507 kg)    Usual Body Weight: unable to determine  % Usual Body Weight: unable to determine  BMI:  Body mass index is 22.54 kg/(m^2).  Estimated Nutritional Needs: Kcal: 1550-1750 Protein: 60-70 gram Fluid: >/=1600 ml/daily  Skin: WDL  Diet Order: NPO  EDUCATION NEEDS: -No education needs identified at this time   Intake/Output Summary (Last 24 hours) at 06/12/14 1415 Last data filed at 06/12/14 0900  Gross per 24 hour  Intake 934.17 ml  Output    575 ml  Net 359.17 ml    Last BM: 6/17  Labs:   Recent Labs Lab 06/11/14 1542 06/11/14 2200 06/12/14 0446  NA 142  --  144  K 3.9  --  2.9*  CL 96  --  99  CO2 36*  --  35*  BUN 12  --  12  CREATININE 1.13*  --  1.11*  CALCIUM 8.6  --  8.8  MG  --  1.7  --   GLUCOSE 111*  --  156*    CBG (last 3)   Recent Labs  06/12/14 0431 06/12/14 0739 06/12/14 1128  GLUCAP 163* 149* 232*    Scheduled Meds: . antiseptic oral rinse  15  mL Mouth Rinse BID  . dextrose      . heparin  5,000 Units Subcutaneous 3 times per day  . insulin aspart  0-9 Units Subcutaneous TID WC  . ipratropium-albuterol  3 mL Nebulization Q6H  . levothyroxine  100 mcg Oral QAC breakfast  . lipase/protease/amylase  1 capsule Oral TID WC  . OLANZapine  10 mg Oral QHS  . pantoprazole  40 mg Oral Daily  . piperacillin-tazobactam (ZOSYN)  IV  3.375 g Intravenous Q8H  . [START ON 06/13/2014] pneumococcal 23 valent vaccine  0.5 mL Intramuscular Tomorrow-1000  . simvastatin  20 mg Oral QPM  . sodium chloride  3 mL Intravenous Q12H  . vancomycin  500 mg Intravenous Q12H    Continuous Infusions:    Past Medical History  Diagnosis Date  . Hypertension   . COPD (chronic obstructive pulmonary disease)   . Diabetes mellitus   . Elevated cholesterol   . History of ETOH abuse   . Hypothyroid   . Cirrhosis, alcoholic   . Psychosis   . Lymphoma     T cell  . Stroke     Occipital 2004  . Asthma     History reviewed. No pertinent past surgical history.  Atlee Abide MS RD LDN Clinical Dietitian [phone]

## 2014-06-13 DIAGNOSIS — D509 Iron deficiency anemia, unspecified: Secondary | ICD-10-CM | POA: Diagnosis present

## 2014-06-13 DIAGNOSIS — E876 Hypokalemia: Secondary | ICD-10-CM | POA: Diagnosis present

## 2014-06-13 DIAGNOSIS — F0390 Unspecified dementia without behavioral disturbance: Secondary | ICD-10-CM | POA: Diagnosis present

## 2014-06-13 DIAGNOSIS — R404 Transient alteration of awareness: Secondary | ICD-10-CM

## 2014-06-13 DIAGNOSIS — N179 Acute kidney failure, unspecified: Secondary | ICD-10-CM | POA: Diagnosis present

## 2014-06-13 DIAGNOSIS — F03C0 Unspecified dementia, severe, without behavioral disturbance, psychotic disturbance, mood disturbance, and anxiety: Secondary | ICD-10-CM | POA: Diagnosis present

## 2014-06-13 LAB — BASIC METABOLIC PANEL
BUN: 16 mg/dL (ref 6–23)
CALCIUM: 8.8 mg/dL (ref 8.4–10.5)
CO2: 37 mEq/L — ABNORMAL HIGH (ref 19–32)
CREATININE: 1.27 mg/dL — AB (ref 0.50–1.10)
Chloride: 101 mEq/L (ref 96–112)
GFR calc Af Amer: 47 mL/min — ABNORMAL LOW (ref >90)
GFR, EST NON AFRICAN AMERICAN: 41 mL/min — AB (ref >90)
GLUCOSE: 99 mg/dL (ref 70–99)
Potassium: 5 mEq/L (ref 3.7–5.3)
Sodium: 144 mEq/L (ref 137–147)

## 2014-06-13 LAB — GLUCOSE, CAPILLARY
GLUCOSE-CAPILLARY: 79 mg/dL (ref 70–99)
Glucose-Capillary: 76 mg/dL (ref 70–99)
Glucose-Capillary: 80 mg/dL (ref 70–99)
Glucose-Capillary: 78 mg/dL (ref 70–99)
Glucose-Capillary: 330 mg/dL — ABNORMAL HIGH (ref 70–99)
Glucose-Capillary: 206 mg/dL — ABNORMAL HIGH (ref 70–99)

## 2014-06-13 LAB — CBC
HCT: 28 % — ABNORMAL LOW (ref 36.0–46.0)
HEMOGLOBIN: 8.7 g/dL — AB (ref 12.0–15.0)
MCH: 30 pg (ref 26.0–34.0)
MCHC: 31.1 g/dL (ref 30.0–36.0)
MCV: 96.6 fL (ref 78.0–100.0)
Platelets: 158 10*3/uL (ref 150–400)
RBC: 2.9 MIL/uL — ABNORMAL LOW (ref 3.87–5.11)
RDW: 12.8 % (ref 11.5–15.5)
WBC: 6.9 10*3/uL (ref 4.0–10.5)

## 2014-06-13 MED ORDER — SODIUM CHLORIDE 0.9 % IV SOLN: INTRAVENOUS | Status: DC

## 2014-06-13 MED ORDER — IPRATROPIUM-ALBUTEROL 0.5-2.5 (3) MG/3ML IN SOLN
3.0000 mL | Freq: Three times a day (TID) | RESPIRATORY_TRACT | Status: DC
  Administered 2014-06-14 – 2014-06-15 (×4): 3 mL via RESPIRATORY_TRACT
  Filled 2014-06-13 (×4): qty 3

## 2014-06-13 MED ORDER — SODIUM CHLORIDE 0.9 % IV SOLN
INTRAVENOUS | Status: DC
  Administered 2014-06-13 – 2014-06-14 (×3): via INTRAVENOUS

## 2014-06-13 MED ORDER — INSULIN GLARGINE 100 UNIT/ML ~~LOC~~ SOLN
10.0000 [IU] | Freq: Every day | SUBCUTANEOUS | Status: DC
Start: 2014-06-13 — End: 2014-06-30

## 2014-06-13 MED ORDER — FERROUS SULFATE 325 (65 FE) MG PO TABS: 325.0000 mg | ORAL_TABLET | Freq: Two times a day (BID) | ORAL | Status: DC

## 2014-06-13 MED ORDER — SODIUM CHLORIDE 0.9 % IV SOLN
INTRAVENOUS | Status: DC
  Administered 2014-06-13: 08:00:00 via INTRAVENOUS
  Filled 2014-06-13: qty 1000

## 2014-06-13 MED ORDER — LEVOFLOXACIN 500 MG PO TABS: 750.0000 mg | ORAL_TABLET | Freq: Every day | ORAL | Status: DC

## 2014-06-13 NOTE — Progress Notes (Signed)
Clinical Social Work Department BRIEF PSYCHOSOCIAL ASSESSMENT 06/13/2014  Patient:  [name], [name]     Account Number:  [id number]     Admit date:  06/11/2014  Clinical Social Worker:  Renold Genta  Date/Time:  06/13/2014 11:32 AM  Referred by:  Physician  Date Referred:  06/13/2014 Referred for  SNF Placement   Other Referral:   Interview type:  Other - See comment Other interview type:   patient's HCPOA, [name]    PSYCHOSOCIAL DATA Living Status:  ALONE Admitted from facility:   Level of care:   Primary support name:  Darliss Cheney (HCPOA/Friend) c#: [phone] Primary support relationship to patient:  FRIEND Degree of support available:   good    CURRENT CONCERNS Current Concerns  Post-Acute Placement   Other Concerns:    SOCIAL WORK ASSESSMENT / PLAN CSW received consult for SNF placement.   Assessment/plan status:  Information/Referral to [employer] Other assessment/ plan:   Information/referral to community resources:   CSW completed FL2 and faxed information out to [facility] - provided list of facilities to [facility] who accepted bed offer @ [facility].    PATIENT'S/FAMILY'S RESPONSE TO PLAN OF CARE: [place]CPOA [place] would like for her to go to a facility that would take good care of her. HCPOA to complete admission paperwork this afternoon at 3:00pm, for discharge Sunday.       Raynaldo Opitz, [facility] Clinical Social Worker cell #: [phone]

## 2014-06-13 NOTE — Evaluation (Signed)
Physical Therapy One Time Evaluation Patient Details Name: [name] MRN: [id number] DOB: Oct 11, 1941 Today's Date: 06/13/2014   History of Present Illness  72 y.o.female with h/o ETOH, bipolar, COPD on 3-4L home O2, CVA admitted with SOB and diagnosed with HCAP.  Clinical Impression  Patient evaluated by Physical Therapy with no further acute PT needs identified. All education has been completed and the patient has no further questions.  Pt ambulated in hallway on 4L O2 and reports being at baseline.  Pt supervision level for mobility currently and recommend supervision upon d/c for safety.  Pt reports she uses oxygen at home and denies any SOB during activity today. See below for any follow-up Physial Therapy or equipment needs. PT is signing off. Thank you for this referral.     Follow Up Recommendations No PT follow up;Supervision for mobility/OOB    Equipment Recommendations  None recommended by PT    Recommendations for Other Services       Precautions / Restrictions Precautions Precaution Comments: chronic oxygen Restrictions Weight Bearing Restrictions: No      Mobility  Bed Mobility Overal bed mobility: Modified Independent                Transfers Overall transfer level: Needs assistance Equipment used: None Transfers: Sit to/from Stand Sit to Stand: Supervision         General transfer comment: supervision for safety - lines/oxygen  Ambulation/Gait Ambulation/Gait assistance: Min guard;Supervision Ambulation Distance (Feet): 120 Feet Assistive device: None Gait Pattern/deviations: Wide base of support;Decreased stride length;Step-through pattern Gait velocity: decr   General Gait Details: slightly unsteady but no LOB observed, pt reports this is her baseline ambulation, denies SOB, ambulated on 4L O2 [place]  Stairs            Wheelchair Mobility    Modified Rankin (Stroke Patients Only)       Balance Overall balance assessment:   (denies hx of falls)                                           Pertinent Vitals/Pain SpO2 on 4L O2 [place] after ambulation 91% Pt reports using 3.5 L O2 home. Returned to 3L upon positioning back in bed    Home Living Family/patient expects to be discharged to:: Private residence Living Arrangements: Non-relatives/Friends Available Help at Discharge: Friend(s) Type of Home: House Home Access: Stairs to enter Entrance Stairs-Rails: Right Entrance Stairs-Number of Steps: 3 Home Layout: One level Home Equipment: None (home O2 per pt) Additional Comments: per pt report    Prior Function Level of Independence: Needs assistance   Gait / Transfers Assistance Needed: independent with oxygen, denies falls  ADL's / Homemaking Assistance Needed: pt stated that she was independent withADLs initially, the she stated later that sometimes needs help to bathe and dress  Comments: uncertain of PLOF report by pt being accurate     Hand Dominance   Dominant Hand: Right    Extremity/Trunk Assessment   Upper Extremity Assessment: Overall WFL for tasks assessed           Lower Extremity Assessment: Overall WFL for tasks assessed         Communication   Communication: No difficulties  Cognition Arousal/Alertness: Awake/alert   Overall Cognitive Status: History of cognitive impairments - at baseline Area of Impairment: Orientation;Memory Orientation Level: Disoriented to;Place;Time   Memory: Decreased short-term  memory              General Comments      Exercises        Assessment/Plan    PT Assessment Patent does not need any further PT services  PT Diagnosis     PT Problem List    PT Treatment Interventions     PT Goals (Current goals can be found in the Care Plan section) Acute Rehab PT Goals Patient Stated Goal: eat something PT Goal Formulation: No goals set, d/c therapy    Frequency     Barriers to discharge        Co-evaluation                End of Session Equipment Utilized During Treatment: Oxygen Activity Tolerance: Patient tolerated treatment well Patient left: in bed;with call bell/phone within reach           Time: 4944-9675 PT Time Calculation (min): 12 min   Charges:   PT Evaluation $Initial PT Evaluation Tier I: 1 Procedure PT Treatments $Gait Training: 8-22 mins   PT G Codes:          LEMYRE,KATHrine E 06/13/2014, 5:03 PM Carmelia Bake, PT, DPT 06/13/2014 Pager: [phone]

## 2014-06-13 NOTE — Discharge Summary (Addendum)
Physician Discharge Summary  [name] UDJ:[id number] DOB: 13-Aug-1941 DOA: 06/11/2014  PCP: [name], MD  Admit date: 06/11/2014 Discharge date: 06/15/2014  Time spent: 40 minutes  Recommendations for Outpatient Follow-up:  1. D/c to SNF. Recommend palliative care consult as outpatient.  2. Please make outpt referral to GI for anemia  3. Please follow up with repeat CT chest vs PET scan for pulmonary nodule seen on imaging given high risk for bronchogenic ca.  Discharge Diagnoses:  Principal Problem:   HCAP (healthcare-associated pneumonia)  Active Problems:   HYPOTHYROIDISM   DIABETES MELLITUS, TYPE II   DYSLIPIDEMIA   BIPOLAR AFFECTIVE DISORDER   RESPIRATORY FAILURE, CHRONIC   COPD (chronic obstructive pulmonary disease)   Delirium   AKI (acute kidney injury)   Pulmonary nodule: per CT chest 06/11/14   Hypokalemia   Anemia, iron deficiency   Advanced dementia   Discharge Condition: fair  Diet recommendation: regular  Filed Weights   06/11/14 2107  Weight: 62.4 kg (137 lb 9.1 oz)    History of present illness:  Please refer to admission H&P for details, but in brief, 74 year old female with chronic respiratory failure on home O2, advanced dementia, COPD, hypertension, diabetes mellitus, hypothyroidism, history of CVA and prior history of alcoholic cirrhosis presented with two-day history of worsening shortness of breath. Patient found to have right sided infiltrate likely due to aspiration pneumonia. Patient admitted for further management.   Hospital Course:  Healthcare associated / aspiration pneumonia  Placed On empiric vancomycin and Zosyn. Seen byswallow eval and suggest she is at some risk for aspiration. Recommend regular diet with thin liquids.  Cultures negative. Continue O2 via nasal cannula and nebulizers.  Patient will be discharged on oral Levaquin to complete a 7 day course of antibiotics. Blood cx negative.  Type 2 diabetes mellitus   A1c of 6.8. Had hypoglycemic episode on  6/18. Given dextrose. fsg stable ow that she is eating. Reduced lantus dose to 10 units bedtime and SSI. Marland Kitchen A1C of 6.8. Marland Kitchen  Anemia  Noted to have about 2 gm drop in H&H from recent level. stool for occult blood negative. Has not had any GI w/up in past. Avoid NSAIDs. irne panel suggests iron deficiency. Added iron sulfate. Needs GI referral as outpt  Hypokalemia  Replenished with kcl   Chronic resp failure with COPD  Continue o2 and nebs   bipolar affective disorder/dementia  . Continue home regimen of Zyprexa. Has advanced dementia and delirium . Seen by psych during recent hospitalization and deemed not having capacity.  UTI  on empiric abx. cx negative  hyperlipidemia  Continue home dose Zocor.   pulmonary nodule noted on CT chest  Will treat current acute healthcare associated pneumonia.  needs outpatient followup with repeat CT or PET scan. .   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 Monitor as outpt  Acute kidney injury  mild and likely due to dehydration. Improved with fluids   Discharge Exam: Filed Vitals:   06/13/14 1356  BP: 104/49  Pulse: 78  Temp: 98.3 F (36.8 C)  Resp: 20     General: Elderly female in bed in NAD  HEENT: no pallor, moist mucosa  Chest: clear b/l, no added sounds  Cardiovascular: NS1&S2 , no murmurs  Abd: soft, NT, ND, BS+ Ext: warm, no edema  CNS: AAOX2   Discharge Instructions You were cared for by a hospitalist during your hospital stay. If you have any questions about your discharge medications or  the care you received while you were in the hospital after you are discharged, you can call the unit and asked to speak with the hospitalist on call if the hospitalist that took care of you is not available. Once you are discharged, your primary care physician will handle any further medical issues. Please note that NO REFILLS for any discharge medications will be  authorized once you are discharged, as it is imperative that you return to your primary care physician (or establish a relationship with a primary care physician if you do not have one) for your aftercare needs so that they can reassess your need for medications and monitor your lab values.     Medication List         budesonide 0.5 MG/2ML nebulizer solution  Commonly known as:  PULMICORT  Take 0.5 mg by nebulization daily as needed (for inflammation of lungs).     cholecalciferol 1000 UNITS tablet  Commonly known as:  VITAMIN D  Take 4,000 Units by mouth every morning.     ferrous sulfate 325 (65 FE) MG tablet  Take 1 tablet (325 mg total) by mouth 2 (two) times daily with a meal.     insulin glargine 100 UNIT/ML injection  Commonly known as:  LANTUS  Inject 0.1 mLs (10 Units total) into the skin at bedtime.     ipratropium-albuterol 0.5-2.5 (3) MG/3ML Soln  Commonly known as:  DUONEB  Take 3 mLs by nebulization every 6 (six) hours as needed (for inflammation of lung).     levofloxacin 500 MG tablet  Commonly known as:  LEVAQUIN  Take 1.5 tablets (750 mg total) by mouth daily.  Start taking on:  06/16/2014 until 06/18/2014     levothyroxine 100 MCG tablet  Commonly known as:  SYNTHROID, LEVOTHROID  Take 100 mcg by mouth daily before breakfast.     multivitamin with minerals Tabs tablet  Take 1 tablet by mouth daily.     OLANZapine 10 MG tablet  Commonly known as:  ZYPREXA  Take 10 mg by mouth at bedtime.     simvastatin 20 MG tablet  Commonly known as:  ZOCOR  Take 20 mg by mouth every evening.     sitaGLIPtin 100 MG tablet  Commonly known as:  JANUVIA  Take 0.5 tablets (50 mg total) by mouth daily.     ZENPEP 20000 UNITS Cpep  Generic drug:  Pancrelipase (Lip-Prot-Amyl)  Take 1-2 capsules by mouth 4 (four) times daily. 2 capsules in the morning, 2 capsules around lunchtime, 1 capsule in the afternoon, and 2 capsules at bedtime.       Allergies  Allergen  Reactions  . Bee Venom Anaphylaxis       Follow-up Information   Please follow up. (follow up at [facility])        The results of significant diagnostics from this hospitalization (including imaging, microbiology, ancillary and laboratory) are listed below for reference.    Significant Diagnostic Studies: Dg Chest 2 View (if Patient Has Fever And/or Copd)  06/11/2014   CLINICAL DATA:  Shortness of Breath  EXAM: CHEST  2 VIEW  COMPARISON:  May 08, 2014  FINDINGS: There is underlying emphysematous change. There is patchy infiltrate in the right lower lobe. Elsewhere lungs are clear. Heart size is normal. Pulmonary vascularity is within normal limits. No adenopathy. There is evidence of old rib trauma on the left.  IMPRESSION: Right base infiltrate.  Underlying emphysema.   Electronically Signed   By: Gwyndolyn Saxon  Jasmine December M.D.   On: 06/11/2014 16:29   Ct Angio Chest W/cm &/or Wo Cm  06/11/2014   CLINICAL DATA:  Shortness of breath, elevated D-dimers.  EXAM: CT ANGIOGRAPHY CHEST WITH CONTRAST  TECHNIQUE: Multidetector CT imaging of the chest was performed using the standard protocol during bolus administration of intravenous contrast. Multiplanar CT image reconstructions and MIPs were obtained to evaluate the vascular anatomy.  CONTRAST:  148mL OMNIPAQUE IOHEXOL 350 MG/ML SOLN  COMPARISON:  Chest x-ray June 11, 2014  FINDINGS: There is no pulmonary embolus. There is no mediastinal or hilar lymphadenopathy. There are small mediastinal lymph nodes largest measures 6 mm in short axis in the aortopulmonary window, not pathologically enlarged by CT size criteria. The heart size is normal. There is no pericardial effusion. There is an 8.8 x 13.1 mm spiculated nodule in the right apex. There are emphysematous changes of both lungs. There is small area patchy consolidation of the lateral anterior right lower lobe. There is no pleural effusion. The visualized upper abdominal structures are unremarkable. No focal  discrete lytic or blastic lesion is identified within the bones. There mild degenerative joint changes of the spine.  Review of the MIP images confirms the above findings.  IMPRESSION: No pulmonary embolus.  Small area of patchy consolidation of the lateral anterior right upper lobe suspicious for developing pneumonia.  A 0.8 x 13.1 mm spiculated nodule in the right apex, neoplasm is not excluded. Further evaluation with PET/CT is recommended.   Electronically Signed   By: Abelardo Diesel M.D.   On: 06/11/2014 17:56    Microbiology: Recent Results (from the past 240 hour(s))  CULTURE, BLOOD (ROUTINE X 2)     Status: None   Collection Time    06/11/14  7:05 PM      Result Value Ref Range Status   Specimen Description BLOOD LEFT HAND   Final   Special Requests BOTTLES DRAWN AEROBIC AND ANAEROBIC 2.5ML   Final   Culture  Setup Time     Final   Value: 06/11/2014 22:49     Performed at [facility]   Culture     Final   Value:        BLOOD CULTURE RECEIVED NO GROWTH TO DATE CULTURE WILL BE HELD FOR 5 DAYS BEFORE ISSUING A FINAL NEGATIVE REPORT     Performed at [facility]   Report Status PENDING   Incomplete  CULTURE, BLOOD (ROUTINE X 2)     Status: None   Collection Time    06/11/14  7:11 PM      Result Value Ref Range Status   Specimen Description BLOOD LEFT HAND    Final   Special Requests BOTTLES DRAWN AEROBIC AND ANAEROBIC 3ML   Final   Culture  Setup Time     Final   Value: 06/11/2014 22:48     Performed at [facility]   Culture     Final   Value:        BLOOD CULTURE RECEIVED NO GROWTH TO DATE CULTURE WILL BE HELD FOR 5 DAYS BEFORE ISSUING A FINAL NEGATIVE REPORT     Performed at [facility]   Report Status PENDING   Incomplete  URINE CULTURE     Status: None   Collection Time    06/11/14 10:57 PM      Result Value Ref Range Status   Specimen Description URINE, RANDOM   Final   Special Requests NONE   Final  Culture  Setup Time     Final    Value: 06/12/2014 01:11     Performed at [facility]     Final   Value: NO GROWTH     Performed at [facility]   Culture     Final   Value: NO GROWTH     Performed at [facility]   Report Status 06/12/2014 FINAL   Final     Labs: Basic Metabolic Panel:  Recent Labs Lab 06/11/14 1542 06/11/14 2200 06/12/14 0446 06/13/14 1129  NA 142  --  144 144  K 3.9  --  2.9* 5.0  CL 96  --  99 101  CO2 36*  --  35* 37*  GLUCOSE 111*  --  156* 99  BUN 12  --  12 16  CREATININE 1.13*  --  1.11* 1.27*  CALCIUM 8.6  --  8.8 8.8  MG  --  1.7  --   --    Liver Function Tests:  Recent Labs Lab 06/11/14 1542 06/12/14 0446  AST 19 15  ALT 25 20  ALKPHOS 65 54  BILITOT <0.2* <0.2*  PROT 6.4 5.8*  ALBUMIN 2.7* 2.6*   No results found for this basename: LIPASE, AMYLASE,  in the last 168 hours No results found for this basename: AMMONIA,  in the last 168 hours CBC:  Recent Labs Lab 06/11/14 1542 06/12/14 0446 06/13/14 1129  WBC 8.3 7.4 6.9  NEUTROABS  --  3.1  --   HGB 8.9* 8.8* 8.7*  HCT 30.1* 29.7* 28.0*  MCV 99.7 98.7 96.6  PLT 165 149* 158   Cardiac Enzymes:  Recent Labs Lab 06/11/14 1542  TROPONINI <0.30   BNP: BNP (last 3 results)  Recent Labs  06/11/14 1542  PROBNP 770.8*   CBG:  Recent Labs Lab 06/12/14 2213 06/13/14 0036 06/13/14 0620 06/13/14 0728 06/13/14 1204  GLUCAP 106* 79 76 80 78       Signed:  DHUNGEL, NISHANT  [facility] 06/13/2014, 2:04 PM

## 2014-06-13 NOTE — Evaluation (Signed)
Clinical/Bedside Swallow Evaluation Patient Details  Name: [name] MRN: [id number] Date of Birth: Sep 12, 1941  Today's Date: 06/13/2014 Time: 1210-1225 SLP Time Calculation (min): 15 min  Past Medical History:  Past Medical History  Diagnosis Date  . Hypertension   . COPD (chronic obstructive pulmonary disease)   . Diabetes mellitus   . Elevated cholesterol   . History of ETOH abuse   . Hypothyroid   . Cirrhosis, alcoholic   . Psychosis   . Lymphoma     T cell  . Stroke     Occipital 2004  . Asthma    Past Surgical History: History reviewed. No pertinent past surgical history. HPI:  73 year old female with chronic respiratory failure on home O2, advanced dementia, COPD, hypertension, diabetes mellitus, hypothyroidism, history of CVA and prior history of alcoholic cirrhosis presented with two-day history of worsening shortness of breath. Patient found to have right sided infiltrate likely due to aspiration pneumonia   Assessment / Plan / Recommendation Clinical Impression  Pt presents with normal oropharyngeal swallow function with adequate mastication, swift swallow response, appropriate/consistent exhalation post-swallow, and no s/s of aspiration, despite taxing the mechanism with large, successive boluses.  Recommend resuming a regular diet with thin liquids.  No SLP f/u warranted.      Aspiration Risk  Mild    Diet Recommendation Regular;Thin liquid   Liquid Administration via: Cup;Straw Medication Administration: Whole meds with liquid Supervision: Patient able to self feed    Other  Recommendations Oral Care Recommendations: Oral care BID   Follow Up Recommendations  None        Swallow Study Prior Functional Status  Type of Home: House Available Help at Discharge: Friend(s)    General Date of Onset: 06/11/14 HPI: 73 year old female with chronic respiratory failure on home O2, advanced dementia, COPD, hypertension, diabetes mellitus,  hypothyroidism, history of CVA and prior history of alcoholic cirrhosis presented with two-day history of worsening shortness of breath. Patient found to have right sided infiltrate likely due to aspiration pneumonia Type of Study: Bedside swallow evaluation Previous Swallow Assessment: 10/12/12: clinical swallow eval with normal results Diet Prior to this Study: NPO Temperature Spikes Noted: No Respiratory Status: Room air History of Recent Intubation: No Behavior/Cognition: Alert;Cooperative Oral Cavity - Dentition: Poor condition Self-Feeding Abilities: Able to feed self Patient Positioning: Upright in bed Baseline Vocal Quality: Clear Volitional Cough: Strong Volitional Swallow: Able to elicit    Oral/Motor/Sensory Function Overall Oral Motor/Sensory Function: Appears within functional limits for tasks assessed   Ice Chips Ice chips: Within functional limits Presentation: Self Fed   Thin Liquid Thin Liquid: Within functional limits Presentation: Cup;Straw    Nectar Thick Nectar Thick Liquid: Not tested   Honey Thick Honey Thick Liquid: Not tested   Puree Puree: Within functional limits Presentation: Self Fed;Spoon   Solid   Amanda L. [place], [place] CCC/SLP Pager [phone]     Solid: Within functional limits Presentation: Self Fed       Juan Quam Laurice 06/13/2014,12:30 PM

## 2014-06-13 NOTE — Evaluation (Signed)
Occupational Therapy Evaluation Patient Details Name: [name] MRN: [id number] DOB: 1941/07/12 Today's Date: 06/13/2014    History of Present Illness 73 y.o. female with h/o EtOH, bipolar, COPD on 24* home O2 admitted with hypotension, positive orthostatics.    Clinical Impression   Pt is at sup level with ADLs and ADL mobility. Uncertain if pt's PLOF report is accurate. Pt has a POA and but he does not live with her. Pt's POA Shanon Brow, arrives towards end of eval. Pt pleasantly confused and  anxious about SLP seeing her so that her NPO status can change. Recommend SNF/ALF placement for pt, do not feel it is safe for pt to live at home alone. Pt's POA requesting to see pt's nurse and SW (they were informed of request). No further acute OT services indicated at this time    Follow Up Recommendations  No OT follow up;SNF;Supervision/Assistance - 24 hour (ALF?)    Equipment Recommendations  None recommended by OT    Recommendations for Other Services PT consult     Precautions / Restrictions Restrictions Weight Bearing Restrictions: No      Mobility Bed Mobility Overal bed mobility: Modified Independent                Transfers Overall transfer level: Needs assistance Equipment used: 1 person hand held assist Transfers: Sit to/from Stand Sit to Stand: Supervision              Balance Overall balance assessment: No apparent balance deficits (not formally assessed)                                          ADL Overall ADL's : Needs assistance/impaired     Grooming: Wash/dry hands;Wash/dry face;Standing;Supervision/safety   Upper Body Bathing: Supervision/ safety;Sitting   Lower Body Bathing: Supervison/ safety;Sit to/from stand   Upper Body Dressing : Supervision/safety;Sitting   Lower Body Dressing: Sit to/from stand;Supervision/safety   Toilet Transfer: Supervision/safety;BSC   Toileting- Clothing Manipulation and Hygiene:  Supervision/safety;Sit to/from stand       Functional mobility during ADLs: Supervision/safety       Vision  wears reading glasses per pt report                   Perception Perception Perception Tested?: No   Praxis Praxis Praxis tested?: Not tested    Pertinent Vitals/Pain No c/o pain, VSS     Hand Dominance Right   Extremity/Trunk Assessment Upper Extremity Assessment Upper Extremity Assessment: Overall WFL for tasks assessed   Lower Extremity Assessment Lower Extremity Assessment: Defer to PT evaluation       Communication Communication Communication: No difficulties   Cognition Arousal/Alertness: Awake/alert   Overall Cognitive Status: History of cognitive impairments - at baseline Area of Impairment: Orientation;Memory Orientation Level: Disoriented to;Place;Time   Memory: Decreased short-term memory             General Comments   Pt pleasantly confused and cooperative                 Home Living Family/patient expects to be discharged to:: Private residence Living Arrangements: Non-relatives/Friends Available Help at Discharge: Friend(s) Type of Home: House Home Access: Stairs to enter Technical brewer of Steps: 3 Entrance Stairs-Rails: Right Home Layout: One level     Bathroom Shower/Tub: Tub/shower unit;Walk-in Psychologist, prison and probation services: Standard     Home Equipment:  None   Additional Comments: per pt report      Prior Functioning/Environment Level of Independence: Needs assistance  Gait / Transfers Assistance Needed: independent with oxygen, denies falls ADL's / Homemaking Assistance Needed: pt stated that she was independent withADLs initially, the she stated later that sometimes she needs help to bathe and dress   Comments: uncertain of PLOF report by pt being accurate    OT Diagnosis:     OT Problem List:     OT Treatment/Interventions:      OT Goals(Current goals can be found in the care plan section)  Acute Rehab OT Goals Patient Stated Goal: eat something OT Goal Formulation: With patient  OT Frequency:     Barriers to D/C:  pt lives at home alone                        End of Session Equipment Utilized During Treatment: Gait belt;Other (comment) (BSC)  Activity Tolerance: Patient tolerated treatment well Patient left: in bed;with call bell/phone within reach;with family/visitor present;Other (comment) (POA Shanon Brow, present towards end of session)   Time: [phone] OT Time Calculation (min): 24 min Charges:  OT General Charges $OT Visit: 1 Procedure OT Evaluation $Initial OT Evaluation Tier I: 1 Procedure OT Treatments $Therapeutic Activity: 8-22 mins G-Codes:    Britt Bottom 06/13/2014, 1:41 PM

## 2014-06-13 NOTE — Progress Notes (Signed)
ANTIBIOTIC CONSULT NOTE - FOLLOW UP  Pharmacy Consult for Vancomycin, Zosyn Indication: rule out pneumonia (aspiration)  Allergies  Allergen Reactions  . Bee Venom Anaphylaxis    Patient Measurements: Height: 5' 5.5" (166.4 cm) Weight: 137 lb 9.1 oz (62.4 kg) IBW/kg (Calculated) : 58.15  Vital Signs: Temp: 97.3 F (36.3 C) (06/19 3532) Temp src: Oral (06/19 0608) BP: 108/65 mmHg (06/19 0608) Pulse Rate: 70 (06/19 0608) Intake/Output from previous day: 06/18 0701 - 06/19 0700 In: 636.2 [P.O.:120; I.V.:166.2; IV Piggyback:350] Out: 1850 [Urine:1850]  Labs:  Recent Labs  06/11/14 1542 06/12/14 0446  WBC 8.3 7.4  HGB 8.9* 8.8*  PLT 165 149*  CREATININE 1.13* 1.11*   Estimated Creatinine Clearance: 41.5 ml/min (by C-G formula based on Cr of 1.11). No results found for this basename: VANCOTROUGH, Corlis Leak, VANCORANDOM, GENTTROUGH, GENTPEAK, GENTRANDOM, TOBRATROUGH, TOBRAPEAK, TOBRARND, AMIKACINPEAK, AMIKACINTROU, AMIKACIN,  in the last 72 hours    Anti-infectives:  6/17 >> zosyn x1  6/17 >> cefepime >> 6/18  6/17 >> vancomycin >>  6/18 >> Zosyn >>   Assessment: 64 yoF with hx of chronic respiratory failure on home O2 3-4 L, COPD, hypertension, diabetes, hyperlipidemia, hypothyroidism, history of CVA in 2004, prior history of alcoholic cirrhosis presenting to the ED with a one to two-day history of worsening shortness of breath at rest and on exertion. Pharmacy is consulted to dose Vancomycin and Zosyn.   6/19: Day #3 vancomycin and Day #2 Zosyn.  Cefepime changed to Zosyn for anaerobe coverage with suspected aspiration.   Tmax: afebrile  WBCs: 7.4   Renal: Scr 1.11, CrCl ~ 41 ml/min   Vancomycin trough level before next dose  Goal of Therapy:  Vancomycin trough level 15-20 mcg/ml  Plan:   Zosyn 3.375g IV Q8H infused over 4hrs.  Vancomycin 500mg  IV q12h.  Bmet and Vanc trough at steady state, before next dose.  Follow up renal fxn and culture  results.  Gretta Arab PharmD, BCPS Pager [phone] 06/13/2014 7:50 AM

## 2014-06-14 DIAGNOSIS — F0390 Unspecified dementia without behavioral disturbance: Secondary | ICD-10-CM

## 2014-06-14 LAB — BASIC METABOLIC PANEL
BUN: 19 mg/dL (ref 6–23)
CALCIUM: 8.4 mg/dL (ref 8.4–10.5)
CO2: 33 meq/L — AB (ref 19–32)
CREATININE: 1.42 mg/dL — AB (ref 0.50–1.10)
Chloride: 104 mEq/L (ref 96–112)
GFR calc Af Amer: 41 mL/min — ABNORMAL LOW (ref >90)
GFR calc non Af Amer: 36 mL/min — ABNORMAL LOW (ref >90)
GLUCOSE: 178 mg/dL — AB (ref 70–99)
Potassium: 4.5 mEq/L (ref 3.7–5.3)
Sodium: 145 mEq/L (ref 137–147)

## 2014-06-14 LAB — GLUCOSE, CAPILLARY
GLUCOSE-CAPILLARY: 114 mg/dL — AB (ref 70–99)
Glucose-Capillary: 137 mg/dL — ABNORMAL HIGH (ref 70–99)
Glucose-Capillary: 148 mg/dL — ABNORMAL HIGH (ref 70–99)
Glucose-Capillary: 183 mg/dL — ABNORMAL HIGH (ref 70–99)

## 2014-06-14 MED ORDER — INSULIN ASPART 100 UNIT/ML ~~LOC~~ SOLN: 0.0000 [IU] | Freq: Every day | SUBCUTANEOUS | Status: DC

## 2014-06-14 MED ORDER — INSULIN GLARGINE 100 UNIT/ML ~~LOC~~ SOLN
10.0000 [IU] | Freq: Every day | SUBCUTANEOUS | Status: DC
  Administered 2014-06-14: 10 [IU] via SUBCUTANEOUS
  Filled 2014-06-14: qty 0.1

## 2014-06-14 MED ORDER — FERROUS SULFATE 325 (65 FE) MG PO TABS
325.0000 mg | ORAL_TABLET | Freq: Two times a day (BID) | ORAL | Status: DC
  Administered 2014-06-14 – 2014-06-15 (×2): 325 mg via ORAL
  Filled 2014-06-14 (×4): qty 1

## 2014-06-14 MED ORDER — INSULIN ASPART 100 UNIT/ML ~~LOC~~ SOLN
0.0000 [IU] | Freq: Three times a day (TID) | SUBCUTANEOUS | Status: DC
  Administered 2014-06-14 (×2): 2 [IU] via SUBCUTANEOUS
  Administered 2014-06-14: 3 [IU] via SUBCUTANEOUS

## 2014-06-14 NOTE — Progress Notes (Signed)
[facility] PROGRESS NOTE  ATLEE VILLERS DDU:[id number] DOB: 13-Jan-1941 DOA: 06/11/2014 PCP: [name], MD  Assessment/Plan: Healthcare associated /  aspiration pneumonia On empiric Zosyn. Switch to Levaquin upon discharge Afebrile. Cultures negative. Continue O2 via nasal cannula Seen by swallow eval and recommend a regular diet with thin liquids.  Type 2 diabetes mellitus A1c of 6.8. Lantus was reduced to 10 units daily given episode of hypoglycemia  Anemia Needs outpatient GI evaluation given drop in her hemoglobin from her recent baseline. Iron panel suggests iron deficiency. Will place on iron supplements. Stool for occult blood negative.  Chronic resp failure with COPD  Continue o2 and nebs   bipolar affective disorder/dementia  . Continue home regimen of Zyprexa. Has advanced dementia and delirium . Seen by psych during recent hospitalization and deemed not having capacity.   UTI  on empiric abx. cx negative   hyperlipidemia  Continue home dose Zocor.   pulmonary nodule noted on CT chest  Will treat current acute healthcare associated pneumonia. needs outpatient followup with repeat CT or PET scan. .   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 Monitor   Hypothyroidism Continue Synthroid   Code Status: DO NOT RESUSCITATE Family Communication: None at bedside Disposition Plan: Skilled nursing facility tomorrow   Consultants:  None  Procedures:  None  Antibiotics:  IV Zosyn  HPI/Subjective: Patient seen and examined this morning. Appears more oriented.  Objective: Filed Vitals:   06/14/14 0633  BP: 116/57  Pulse: 71  Temp: 97.6 F (36.4 C)  Resp: 18    Intake/Output Summary (Last 24 hours) at 06/14/14 1123 Last data filed at 06/14/14 0900  Gross per 24 hour  Intake 1747.5 ml  Output   1400 ml  Net  347.5 ml   Filed Weights   06/11/14 2107 06/14/14 0633  Weight: 62.4 kg (137 lb 9.1 oz) 63.4 kg  (139 lb 12.4 oz)    Exam:  General: Elderly female in bed in NAD  HEENT: no pallor, moist mucosa  Chest: coarse breath sounds b/l with crackles  Cardiovascular: NS1&S2 , no murmurs  Abd: soft, NT, ND, BS+ Ext: warm, no edema  CNS: AAOX2,    Data Reviewed: Basic Metabolic Panel:  Recent Labs Lab 06/11/14 1542 06/11/14 2200 06/12/14 0446 06/13/14 1129 06/14/14 0354  NA 142  --  144 144 145  K 3.9  --  2.9* 5.0 4.5  CL 96  --  99 101 104  CO2 36*  --  35* 37* 33*  GLUCOSE 111*  --  156* 99 178*  BUN 12  --  12 16 19   CREATININE 1.13*  --  1.11* 1.27* 1.42*  CALCIUM 8.6  --  8.8 8.8 8.4  MG  --  1.7  --   --   --    Liver Function Tests:  Recent Labs Lab 06/11/14 1542 06/12/14 0446  AST 19 15  ALT 25 20  ALKPHOS 65 54  BILITOT <0.2* <0.2*  PROT 6.4 5.8*  ALBUMIN 2.7* 2.6*   No results found for this basename: LIPASE, AMYLASE,  in the last 168 hours No results found for this basename: AMMONIA,  in the last 168 hours CBC:  Recent Labs Lab 06/11/14 1542 06/12/14 0446 06/13/14 1129  WBC 8.3 7.4 6.9  NEUTROABS  --  3.1  --   HGB 8.9* 8.8* 8.7*  HCT 30.1* 29.7* 28.0*  MCV 99.7 98.7 96.6  PLT 165 149* 158   Cardiac Enzymes:  Recent Labs Lab 06/11/14 1542  TROPONINI <0.30   BNP (last 3 results)  Recent Labs  06/11/14 1542  PROBNP 770.8*   CBG:  Recent Labs Lab 06/13/14 0728 06/13/14 1204 06/13/14 1644 06/13/14 2222 06/14/14 0736  GLUCAP 80 78 330* 206* 137*    Recent Results (from the past 240 hour(s))  CULTURE, BLOOD (ROUTINE X 2)     Status: None   Collection Time    06/11/14  7:05 PM      Result Value Ref Range Status   Specimen Description BLOOD LEFT HAND   Final   Special Requests BOTTLES DRAWN AEROBIC AND ANAEROBIC 2.5ML   Final   Culture  Setup Time     Final   Value: 06/11/2014 22:49     Performed at [facility]   Culture     Final   Value:        BLOOD CULTURE RECEIVED NO GROWTH TO DATE CULTURE WILL BE HELD FOR 5  DAYS BEFORE ISSUING A FINAL NEGATIVE REPORT     Performed at [facility]   Report Status PENDING   Incomplete  CULTURE, BLOOD (ROUTINE X 2)     Status: None   Collection Time    06/11/14  7:11 PM      Result Value Ref Range Status   Specimen Description BLOOD LEFT HAND    Final   Special Requests BOTTLES DRAWN AEROBIC AND ANAEROBIC 3ML   Final   Culture  Setup Time     Final   Value: 06/11/2014 22:48     Performed at [facility]   Culture     Final   Value:        BLOOD CULTURE RECEIVED NO GROWTH TO DATE CULTURE WILL BE HELD FOR 5 DAYS BEFORE ISSUING A FINAL NEGATIVE REPORT     Performed at [facility]   Report Status PENDING   Incomplete  URINE CULTURE     Status: None   Collection Time    06/11/14 10:57 PM      Result Value Ref Range Status   Specimen Description URINE, RANDOM   Final   Special Requests NONE   Final   Culture  Setup Time     Final   Value: 06/12/2014 01:11     Performed at [facility]     Final   Value: NO GROWTH     Performed at [facility]   Culture     Final   Value: NO GROWTH     Performed at [facility]   Report Status 06/12/2014 FINAL   Final     Studies: No results found.  Scheduled Meds: . antiseptic oral rinse  15 mL Mouth Rinse BID  . heparin  5,000 Units Subcutaneous 3 times per day  . insulin aspart  0-15 Units Subcutaneous TID WC  . insulin aspart  0-5 Units Subcutaneous QHS  . insulin glargine  10 Units Subcutaneous QHS  . ipratropium-albuterol  3 mL Nebulization TID  . levothyroxine  100 mcg Oral QAC breakfast  . lipase/protease/amylase  1 capsule Oral TID WC  . OLANZapine  10 mg Oral QHS  . pantoprazole  40 mg Oral Daily  . piperacillin-tazobactam (ZOSYN)  IV  3.375 g Intravenous Q8H  . simvastatin  20 mg Oral QPM  . sodium chloride  3 mL Intravenous Q12H   Continuous Infusions: . sodium chloride 75 mL/hr at 06/14/14 0411  Time spent: 25  minutes    Louellen Molder  [facility] Pager [phone] If 7PM-7AM, please contact night-coverage at [email or web address], password [facility] 06/14/2014, 11:23 AM  LOS: 3 days

## 2014-06-15 DIAGNOSIS — N390 Urinary tract infection, site not specified: Secondary | ICD-10-CM

## 2014-06-15 DIAGNOSIS — N179 Acute kidney failure, unspecified: Secondary | ICD-10-CM

## 2014-06-15 LAB — BASIC METABOLIC PANEL
BUN: 21 mg/dL (ref 6–23)
CHLORIDE: 106 meq/L (ref 96–112)
CO2: 31 mEq/L (ref 19–32)
Calcium: 8.4 mg/dL (ref 8.4–10.5)
Creatinine, Ser: 1.19 mg/dL — ABNORMAL HIGH (ref 0.50–1.10)
GFR calc Af Amer: 51 mL/min — ABNORMAL LOW (ref >90)
GFR, EST NON AFRICAN AMERICAN: 44 mL/min — AB (ref >90)
Glucose, Bld: 81 mg/dL (ref 70–99)
Potassium: 4.2 mEq/L (ref 3.7–5.3)
SODIUM: 145 meq/L (ref 137–147)

## 2014-06-15 LAB — GLUCOSE, CAPILLARY: GLUCOSE-CAPILLARY: 85 mg/dL (ref 70–99)

## 2014-06-15 NOTE — Progress Notes (Signed)
Pt discharged to SNF. Left unit on stretcher pushed by ambulance personnel. Left in good condition. Report called to facility and given to [facility], nurse. All questions answered to her satisfaction. Vwilliams,rn.

## 2014-06-15 NOTE — Progress Notes (Signed)
Per MD, Pt ready for d/c.  Notified RN, Pt, family and facility.  Sent d/c summary.  Confirmed receipt of d/c summary.  Facility ready to receive Pt.  Arranged for transportation.  Bernita Raisin, [place] Work [phone]

## 2014-06-16 ENCOUNTER — Encounter: Payer: Self-pay | Admitting: Internal Medicine

## 2014-06-16 ENCOUNTER — Non-Acute Institutional Stay (SKILLED_NURSING_FACILITY): Payer: Medicare Other | Admitting: Internal Medicine

## 2014-06-16 DIAGNOSIS — K7030 Alcoholic cirrhosis of liver without ascites: Secondary | ICD-10-CM

## 2014-06-16 DIAGNOSIS — N10 Acute tubulo-interstitial nephritis: Secondary | ICD-10-CM

## 2014-06-16 DIAGNOSIS — R911 Solitary pulmonary nodule: Secondary | ICD-10-CM

## 2014-06-16 DIAGNOSIS — D509 Iron deficiency anemia, unspecified: Secondary | ICD-10-CM

## 2014-06-16 DIAGNOSIS — E039 Hypothyroidism, unspecified: Secondary | ICD-10-CM

## 2014-06-16 DIAGNOSIS — J9610 Chronic respiratory failure, unspecified whether with hypoxia or hypercapnia: Secondary | ICD-10-CM

## 2014-06-16 DIAGNOSIS — F319 Bipolar disorder, unspecified: Secondary | ICD-10-CM

## 2014-06-16 DIAGNOSIS — J189 Pneumonia, unspecified organism: Secondary | ICD-10-CM

## 2014-06-16 DIAGNOSIS — E119 Type 2 diabetes mellitus without complications: Secondary | ICD-10-CM

## 2014-06-16 NOTE — Progress Notes (Signed)
Clinical Social Work Department CLINICAL SOCIAL WORK PLACEMENT NOTE 06/16/2014  Patient:  [name], [name]  Account Number:  [id number] Admit date:  06/11/2014  Clinical Social Worker:  Renold Genta  Date/time:  06/12/2014 12:42 PM  Clinical Social Work is seeking post-discharge placement for this patient at the following level of care:   SKILLED NURSING   (*CSW will update this form in Epic as items are completed)   06/12/2014  Patient/family provided with [place] Department of Clinical Social Work's list of facilities offering this level of care within the geographic area requested by the patient (or if unable, by the patient's family).  06/12/2014  Patient/family informed of their freedom to choose among providers that offer the needed level of care, that participate in Medicare, Medicaid or managed care program needed by the patient, have an available bed and are willing to accept the patient.  06/12/2014  Patient/family informed of [facility]' ownership interest in [facility], as well as of the fact that they are under no obligation to receive care at this facility.  PASARR submitted to EDS on 06/12/2014 PASARR number received on 06/12/2014  FL2 transmitted to all facilities in geographic area requested by pt/family on  06/12/2014 FL2 transmitted to all facilities within larger geographic area on   Patient informed that his/her managed care company has contracts with or will negotiate with  certain facilities, including the following:     Patient/family informed of bed offers received:  06/13/2014 Patient chooses bed at [place] Physician recommends and patient chooses bed at    Patient to be transferred to [place] on  06/15/2014 Patient to be transferred to facility by [facility] Patient and family notified of transfer on  Name of family member notified:    The following physician request were  entered in Epic:   Additional Comments: Raynaldo Opitz, [place] Social Worker cell #: [phone]

## 2014-06-16 NOTE — Progress Notes (Signed)
Patient ID: [name], female   DOB: 11-May-1941, 73 y.o.   MRN: [id number]   this is an acute visit.  Level of care skilled.  Facility AF.  Chief Complaint     .   acute visit followup status post hospitalization for pneumonia-also followup diabetes   HPI: Patient is 73 y.o. female who has severe dementia and is s/p hospitalization for HCAP with h/o chronic respiratory failure. She also has a history of type 2 diabetes apparently with a history of hypoglycemia in the hospital her Lantus was reduced to 10 units-nursing staff has asked about obtaining an appropriate sliding scale for her blood sugars--she also is on a Januvia  So far blood sugars have been 159 in the morning-238 at noon-314 yesterday afternoon-289 last night We do not have many readings so far but I do not see any sign of hypoglycemia which is encouraging Past Medical History   Diagnosis  Date   .  Hypertension    .  COPD (chronic obstructive pulmonary disease)    .  Diabetes mellitus    .  Elevated cholesterol    .  History of ETOH abuse    .  Hypothyroid    .  Cirrhosis, alcoholic    .  Psychosis    .  Lymphoma      T cell   .  Stroke      Occipital 2004   .  Asthma    History reviewed. No pertinent past surgical history.    Medication List                     budesonide 0.5 MG/2ML nebulizer solution    Commonly known as: PULMICORT    Take 0.5 mg by nebulization daily as needed (for inflammation of lungs).    cholecalciferol 1000 UNITS tablet    Commonly known as: VITAMIN D    Take 4,000 Units by mouth every morning.    ferrous sulfate 325 (65 FE) MG tablet    Take 1 tablet (325 mg total) by mouth 2 (two) times daily with a meal.    insulin glargine 100 UNIT/ML injection    Commonly known as: LANTUS    Inject 0.1 mLs (10 Units total) into the skin at bedtime.    ipratropium-albuterol 0.5-2.5 (3) MG/3ML Soln    Commonly known as: DUONEB    Take 3 mLs by nebulization every 6 (six) hours as  needed (for inflammation of lung).    levofloxacin 500 MG tablet    Commonly known as: LEVAQUIN    Take 750 mg by mouth daily.    levothyroxine 100 MCG tablet    Commonly known as: SYNTHROID, LEVOTHROID    Take 100 mcg by mouth daily before breakfast.    multivitamin with minerals Tabs tablet    Take 1 tablet by mouth daily.    OLANZapine 10 MG tablet    Commonly known as: ZYPREXA    Take 10 mg by mouth at bedtime.    simvastatin 20 MG tablet    Commonly known as: ZOCOR    Take 20 mg by mouth every evening.    sitaGLIPtin 100 MG tablet    Commonly known as: JANUVIA    Take 0.5 tablets (50 mg total) by mouth daily.    ZENPEP 20000 UNITS Cpep    Generic drug: Pancrelipase (Lip-Prot-Amyl)    Take 1-2 capsules by mouth 4 (four) times daily. 2 capsules in the morning, 2 capsules  around lunchtime, 1 capsule in the afternoon, and 2 capsules at bedtime.      Meds ordered this encounter   Medications   .  levofloxacin (LEVAQUIN) 500 MG tablet     Sig: Take 750 mg by mouth daily.    Immunization History   Administered  Date(s) Administered   .  Pneumococcal Polysaccharide-23  06/13/2014    History   Substance Use Topics   .  Smoking status:  Former Smoker -- 0.50 packs/day for 56 years     Types:  Cigarettes     Quit date:  05/08/2013   .  Smokeless tobacco:  Never Used   .  Alcohol Use:  No      Comment: Sober since 56   Family history is noncontributory  Review of Systems  DATA OBTAINED: from patient with dementia and no c/o but may not be reliable--nursing staff has not reported any issues  GENERAL: Feels well no fevers, fatigue, appetite changes  SKIN: No itching, rash or wounds  EYES: No eye pain, redness, discharge  EARS: No earache, tinnitus, change in hearing  NOSE: No congestion, drainage or bleeding  MOUTH/THROAT: No mouth or tooth pain, No sore throat  RESPIRATORY: No cough, wheezing, SOB  CARDIAC: No chest pain, palpitations, lower extremity edema  GI: No  abdominal pain, No N/V/D or constipation, No heartburn or reflux  GU: No dysuria, frequency or urgency, or incontinence  MUSCULOSKELETAL: No unrelieved bone/joint pain  NEUROLOGIC: No headache, dizziness or focal weakness  PSYCHIATRIC: No overt anxiety or sadness. Sleeps well. No behavior issue.  Filed Vitals:       BP:    Pulse:    Temp:    Resp:    Physical Exam   Temperature 97.5 pulse 62 respirations 20 blood pressure 124/68 O2 saturation is 95% on 2 L of oxygen GENERAL APPEARANCE: Alert, min conversant. Appropriately groomed. No acute distress.  SKIN: No diaphoresis rash  HEAD: Normocephalic, atraumatic  EYES: Conjunctiva/lids clear. Pupils round, reactive. EOMs intact.   NOSE: No deformity or discharge.  MOUTH/THROAT: Lips w/o lesions  RESPIRATORY: Breathing is even, unlabored. Lung sounds are clear  CARDIOVASCULAR: Heart RRR no murmurs, rubs or gallops. 1+ peripheral edema.  GASTROINTESTINAL: Abdomen is soft, non-tender, not distended w/ normal bowel sounds  GENITOURINARY: Bladder non tender, not distended  MUSCULOSKELETAL: No abnormal joints or musculature  NEUROLOGIC: Oriented X1. Cranial nerves 2-12 grossly intact. Moves all extremities  PSYCHIATRIC: dementia she is oriented to self is pleasant and appropriate, no behavioral issues  Patient Active Problem List    Diagnosis  Date Noted   .  Hypokalemia  06/13/2014   .  Anemia, iron deficiency  06/13/2014   .  Advanced dementia  06/13/2014   .  Acute kidney injury  06/13/2014   .  HCAP (healthcare-associated pneumonia)  06/11/2014   .  Anemia  06/11/2014   .  Pulmonary nodule: per CT chest 06/11/14  06/11/2014   .  UTI (urinary tract infection)  05/10/2014   .  Hypotension  05/08/2014   .  Hyperglycemia  05/08/2014   .  Leukocytosis  05/08/2014   .  AKI (acute kidney injury)  05/08/2014   .  Dehydration  05/08/2014   .  Septic shock(785.52)  10/08/2012   .  Atypical chest pain  09/01/2012   .  SOB (shortness of  breath)  09/01/2012   .  Wheezing  09/01/2012   .  Elevated troponin  09/01/2012   .  Delirium  09/01/2012   .  COPD (chronic obstructive pulmonary disease)  07/19/2012   .  Smoker  07/19/2012   .  RESPIRATORY FAILURE, CHRONIC  07/25/2008   .  WEIGHT LOSS  04/30/2007   .  LYMPHOMA, T-CELL, CUTANEOUS  01/03/2007   .  HYPOTHYROIDISM  01/03/2007   .  DIABETES MELLITUS, TYPE II  01/03/2007   .  DYSLIPIDEMIA  01/03/2007   .  BIPOLAR AFFECTIVE DISORDER  01/03/2007   .  CIRRHOSIS, ALCOHOLIC  29/52/8413   .  CEREBROVASCULAR ACCIDENT, HX OF  01/03/2007   CBC    Component  Value  Date/Time    WBC  6.9  06/13/2014 1129    RBC  2.90*  06/13/2014 1129    HGB  8.7*  06/13/2014 1129    HCT  28.0*  06/13/2014 1129    PLT  158  06/13/2014 1129    MCV  96.6  06/13/2014 1129    LYMPHSABS  3.3  06/12/2014 0446    MONOABS  0.6  06/12/2014 0446    EOSABS  0.4  06/12/2014 0446    BASOSABS  0.0  06/12/2014 0446   CMP    Component  Value  Date/Time    NA  145  06/15/2014 0748    K  4.2  06/15/2014 0748    CL  106  06/15/2014 0748    CO2  31  06/15/2014 0748    GLUCOSE  81  06/15/2014 0748    BUN  21  06/15/2014 0748    CREATININE  1.19*  06/15/2014 0748    CALCIUM  8.4  06/15/2014 0748    PROT  5.8*  06/12/2014 0446    ALBUMIN  2.6*  06/12/2014 0446    AST  15  06/12/2014 0446    ALT  20  06/12/2014 0446    ALKPHOS  54  06/12/2014 0446    BILITOT  <0.2*  06/12/2014 0446    GFRNONAA  44*  06/15/2014 0748    GFRAA  51*  06/15/2014 0748   Assessment and Plan  HCAP (healthcare-associated pneumonia)  Placed On empiric vancomycin and Zosyn. Seen byswallow eval and suggest she is at some risk for aspiration. Recommend regular diet with thin liquids. Cultures negative. Continue O2 via nasal cannula and nebulizers.  She has been discharged on Levaquin for a 7 day course.  DIABETES MELLITUS, TYPE II  A1c of 6.8. Had hypoglycemic episode on 6/18. Given dextrose. fsg stable ow that she is eating. Reduced lantus dose to 10  units bedtime and SSI and start a sliding scale starting at 151-200---2 units-and going up an additional 2 units every 50 increments  Up to   300--at 300+ give 6 units and notify provider . A1C of 6.8; on a statin  Anemia  Noted to have about 2 gm drop in H&H from recent level. stool for occult blood negative. Has not had any GI w/up in past. Avoid NSAIDs. --Will update CBC  RESPIRATORY FAILURE, CHRONIC  Continue O2 and nebs --this appears to be stable again she continues on an antibiotic BIPOLAR AFFECTIVE DISORDER  Continue home regimen of Zyprexa. Has  moderate advanced dementia and delirium . 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

## 2014-06-17 LAB — CULTURE, BLOOD (ROUTINE X 2)
CULTURE: NO GROWTH
Culture: NO GROWTH

## 2014-06-18 ENCOUNTER — Encounter: Payer: Self-pay | Admitting: Internal Medicine

## 2014-06-18 ENCOUNTER — Non-Acute Institutional Stay (SKILLED_NURSING_FACILITY): Payer: Medicare Other | Admitting: Internal Medicine

## 2014-06-18 DIAGNOSIS — D509 Iron deficiency anemia, unspecified: Secondary | ICD-10-CM

## 2014-06-18 DIAGNOSIS — J9610 Chronic respiratory failure, unspecified whether with hypoxia or hypercapnia: Secondary | ICD-10-CM

## 2014-06-18 DIAGNOSIS — K7030 Alcoholic cirrhosis of liver without ascites: Secondary | ICD-10-CM | POA: Insufficient documentation

## 2014-06-18 DIAGNOSIS — F03C0 Unspecified dementia, severe, without behavioral disturbance, psychotic disturbance, mood disturbance, and anxiety: Secondary | ICD-10-CM

## 2014-06-18 DIAGNOSIS — E119 Type 2 diabetes mellitus without complications: Secondary | ICD-10-CM

## 2014-06-18 DIAGNOSIS — F319 Bipolar disorder, unspecified: Secondary | ICD-10-CM

## 2014-06-18 DIAGNOSIS — J189 Pneumonia, unspecified organism: Secondary | ICD-10-CM

## 2014-06-18 DIAGNOSIS — R0902 Hypoxemia: Secondary | ICD-10-CM

## 2014-06-18 DIAGNOSIS — J9611 Chronic respiratory failure with hypoxia: Secondary | ICD-10-CM

## 2014-06-18 DIAGNOSIS — E876 Hypokalemia: Secondary | ICD-10-CM

## 2014-06-18 DIAGNOSIS — F0390 Unspecified dementia without behavioral disturbance: Secondary | ICD-10-CM

## 2014-06-18 DIAGNOSIS — E785 Hyperlipidemia, unspecified: Secondary | ICD-10-CM

## 2014-06-18 DIAGNOSIS — R911 Solitary pulmonary nodule: Secondary | ICD-10-CM

## 2014-06-18 NOTE — Assessment & Plan Note (Signed)
Placed On empiric vancomycin and Zosyn. Seen byswallow eval and suggest she is at some risk for aspiration. Recommend regular diet with thin liquids. Cultures negative. Continue O2 via nasal cannula and nebulizers.  Patient will be discharged on oral Levaquin to complete a 7 day course of antibiotics.  Blood cx negative.

## 2014-06-18 NOTE — Assessment & Plan Note (Signed)
Currently stable.

## 2014-06-18 NOTE — Assessment & Plan Note (Signed)
Continue zocor 

## 2014-06-18 NOTE — Assessment & Plan Note (Signed)
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

## 2014-06-18 NOTE — Assessment & Plan Note (Signed)
Continue O2 and nebs

## 2014-06-18 NOTE — Progress Notes (Signed)
MRN: [id number] Name: [name]  Sex: female Age: 73 y.o. DOB: 26-Sep-1941  [place] #: [facility] Facility/Room: 111 Level Of Care: SNF Sohum Del[place]: Inoc[facility] D Emergency Contacts: Extended Emergency Contact Information Primary Emergency Contact: Dye,[name] Address: [address]          [place], [place] [address] Johnnette Litter of [place] Phone: [phone] Relation: Friend Secondary Emergency Contact: ,Marin Comment Address: [address]          [place], [place] [address] Johnnette Litter of [place] Phone: [phone] Relation: Other  Code Status: DNR  Allergies: Bee venom  Chief Complaint  Patient presents with  . nursing home admission    HPI: Patient is 73 y.o. female who has severe dementia and is s/p hospitalization for HCAP with h/o chronic respiratory failure.  Past Medical History  Diagnosis Date  . Hypertension   . COPD (chronic obstructive pulmonary disease)   . Diabetes mellitus   . Elevated cholesterol   . History of ETOH abuse   . Hypothyroid   . Cirrhosis, alcoholic   . Psychosis   . Lymphoma     T cell  . Stroke     Occipital 2004  . Asthma     History reviewed. No pertinent past surgical history.    Medication List       This list is accurate as of: 06/18/14  7:12 PM.  Always use your most recent med list.               budesonide 0.5 MG/2ML nebulizer solution  Commonly known as:  PULMICORT  Take 0.5 mg by nebulization daily as needed (for inflammation of lungs).     cholecalciferol 1000 UNITS tablet  Commonly known as:  VITAMIN D  Take 4,000 Units by mouth every morning.     ferrous sulfate 325 (65 FE) MG tablet  Take 1 tablet (325 mg total) by mouth 2 (two) times daily with a meal.     insulin glargine 100 UNIT/ML injection  Commonly known as:  LANTUS  Inject 0.1 mLs (10 Units total) into the skin at bedtime.     ipratropium-albuterol 0.5-2.5 (3) MG/3ML Soln  Commonly known as:  DUONEB  Take 3 mLs by  nebulization every 6 (six) hours as needed (for inflammation of lung).     levofloxacin 500 MG tablet  Commonly known as:  LEVAQUIN  Take 750 mg by mouth daily.     levothyroxine 100 MCG tablet  Commonly known as:  SYNTHROID, LEVOTHROID  Take 100 mcg by mouth daily before breakfast.     multivitamin with minerals Tabs tablet  Take 1 tablet by mouth daily.     OLANZapine 10 MG tablet  Commonly known as:  ZYPREXA  Take 10 mg by mouth at bedtime.     simvastatin 20 MG tablet  Commonly known as:  ZOCOR  Take 20 mg by mouth every evening.     sitaGLIPtin 100 MG tablet  Commonly known as:  JANUVIA  Take 0.5 tablets (50 mg total) by mouth daily.     ZENPEP 20000 UNITS Cpep  Generic drug:  Pancrelipase (Lip-Prot-Amyl)  Take 1-2 capsules by mouth 4 (four) times daily. 2 capsules in the morning, 2 capsules around lunchtime, 1 capsule in the afternoon, and 2 capsules at bedtime.        Meds ordered this encounter  Medications  . levofloxacin (LEVAQUIN) 500 MG tablet    Sig: Take 750 mg by mouth daily.  Immunization History  Administered Date(s) Administered  . Pneumococcal Polysaccharide-23 06/13/2014    History  Substance Use Topics  . Smoking status: Former Smoker -- 0.50 packs/day for 56 years    Types: Cigarettes    Quit date: 05/08/2013  . Smokeless tobacco: Never Used  . Alcohol Use: No     Comment: Sober since 75    Family history is noncontributory    Review of Systems  DATA OBTAINED: from patient with dementia and no c/o but may not be reliable GENERAL: Feels well no fevers, fatigue, appetite changes SKIN: No itching, rash or wounds EYES: No eye pain, redness, discharge EARS: No earache, tinnitus, change in hearing NOSE: No congestion, drainage or bleeding  MOUTH/THROAT: No mouth or tooth pain, No sore throat  RESPIRATORY: No cough, wheezing, SOB CARDIAC: No chest pain, palpitations, lower extremity edema  GI: No abdominal pain, No N/V/D or  constipation, No heartburn or reflux  GU: No dysuria, frequency or urgency, or incontinence  MUSCULOSKELETAL: No unrelieved bone/joint pain NEUROLOGIC: No headache, dizziness or focal weakness PSYCHIATRIC: No overt anxiety or sadness. Sleeps well. No behavior issue.   Filed Vitals:   06/18/14 1301  BP: 114/67  Pulse: 81  Temp: 97 F (36.1 C)  Resp: 16    Physical Exam  GENERAL APPEARANCE: Alert, min conversant. Appropriately groomed. No acute distress.  SKIN: No diaphoresis rash HEAD: Normocephalic, atraumatic  EYES: Conjunctiva/lids clear. Pupils round, reactive. EOMs intact.  EARS: External exam WNL, canals clear. Hearing grossly normal.  NOSE: No deformity or discharge.  MOUTH/THROAT: Lips w/o lesions  RESPIRATORY: Breathing is even, unlabored. Lung sounds are clear   CARDIOVASCULAR: Heart RRR no murmurs, rubs or gallops. 1+ peripheral edema.  GASTROINTESTINAL: Abdomen is soft, non-tender, not distended w/ normal bowel sounds GENITOURINARY: Bladder non tender, not distended  MUSCULOSKELETAL: No abnormal joints or musculature NEUROLOGIC: Oriented X1. Cranial nerves 2-12 grossly intact. Moves all extremities  PSYCHIATRIC: dementia, no behavioral issues  Patient Active Problem List   Diagnosis Date Noted  . Hypokalemia 06/13/2014  . Anemia, iron deficiency 06/13/2014  . Advanced dementia 06/13/2014  . Acute kidney injury 06/13/2014  . HCAP (healthcare-associated pneumonia) 06/11/2014  . Anemia 06/11/2014  . Pulmonary nodule: per CT chest 06/11/14 06/11/2014  . UTI (urinary tract infection) 05/10/2014  . Hypotension 05/08/2014  . Hyperglycemia 05/08/2014  . Leukocytosis 05/08/2014  . AKI (acute kidney injury) 05/08/2014  . Dehydration 05/08/2014  . Septic shock(785.52) 10/08/2012  . Atypical chest pain 09/01/2012  . SOB (shortness of breath) 09/01/2012  . Wheezing 09/01/2012  . Elevated troponin 09/01/2012  . Delirium 09/01/2012  . COPD (chronic obstructive  pulmonary disease) 07/19/2012  . Smoker 07/19/2012  . RESPIRATORY FAILURE, CHRONIC 07/25/2008  . WEIGHT LOSS 04/30/2007  . LYMPHOMA, T-CELL, CUTANEOUS 01/03/2007  . HYPOTHYROIDISM 01/03/2007  . DIABETES MELLITUS, TYPE II 01/03/2007  . DYSLIPIDEMIA 01/03/2007  . BIPOLAR AFFECTIVE DISORDER 01/03/2007  . CIRRHOSIS, ALCOHOLIC 78/46/9629  . CEREBROVASCULAR ACCIDENT, HX OF 01/03/2007    CBC    Component Value Date/Time   WBC 6.9 06/13/2014 1129   RBC 2.90* 06/13/2014 1129   HGB 8.7* 06/13/2014 1129   HCT 28.0* 06/13/2014 1129   PLT 158 06/13/2014 1129   MCV 96.6 06/13/2014 1129   LYMPHSABS 3.3 06/12/2014 0446   MONOABS 0.6 06/12/2014 0446   EOSABS 0.4 06/12/2014 0446   BASOSABS 0.0 06/12/2014 0446    CMP     Component Value Date/Time   NA 145 06/15/2014 0748   K 4.2  06/15/2014 0748   CL 106 06/15/2014 0748   CO2 31 06/15/2014 0748   GLUCOSE 81 06/15/2014 0748   BUN 21 06/15/2014 0748   CREATININE 1.19* 06/15/2014 0748   CALCIUM 8.4 06/15/2014 0748   PROT 5.8* 06/12/2014 0446   ALBUMIN 2.6* 06/12/2014 0446   AST 15 06/12/2014 0446   ALT 20 06/12/2014 0446   ALKPHOS 54 06/12/2014 0446   BILITOT <0.2* 06/12/2014 0446   GFRNONAA 44* 06/15/2014 0748   GFRAA 51* 06/15/2014 0748    Assessment and Plan  HCAP (healthcare-associated pneumonia) Placed On empiric vancomycin and Zosyn. Seen byswallow eval and suggest she is at some risk for aspiration. Recommend regular diet with thin liquids. Cultures negative. Continue O2 via nasal cannula and nebulizers.  Patient will be discharged on oral Levaquin to complete a 7 day course of antibiotics.  Blood cx negative.   DIABETES MELLITUS, TYPE II A1c of 6.8. Had hypoglycemic episode on 6/18. Given dextrose. fsg stable ow that she is eating. Reduced lantus dose to 10 units bedtime and SSI. Marland Kitchen A1C of 6.8; on a statin   Anemia Noted to have about 2 gm drop in H&H from recent level. stool for occult blood negative. Has not had any GI w/up in past. Avoid  NSAIDs. irne panel suggests iron deficiency. Added iron sulfate. Needs GI referral as outpt   RESPIRATORY FAILURE, CHRONIC Continue O2 and nebs  BIPOLAR AFFECTIVE DISORDER Continue home regimen of Zyprexa. Has advanced dementia and delirium . Seen by psych during recent hospitalization and deemed not having capacity   Advanced dementia Same as above under bi-polar  DYSLIPIDEMIA Continue zocor  Hypokalemia Repleted in hosp  Pulmonary nodule: per CT chest 06/11/14 With severe dementia question whether to work [employer] pallative  CIRRHOSIS, ALCOHOLIC Currently stable    Hennie Duos, MD

## 2014-06-18 NOTE — Assessment & Plan Note (Signed)
Repleted in hosp

## 2014-06-18 NOTE — Assessment & Plan Note (Signed)
Noted to have about 2 gm drop in H&H from recent level. stool for occult blood negative. Has not had any GI w/up in past. Avoid NSAIDs. irne panel suggests iron deficiency. Added iron sulfate. Needs GI referral as outpt

## 2014-06-18 NOTE — Assessment & Plan Note (Signed)
Continue home regimen of Zyprexa. Has advanced dementia and delirium . Seen by psych during recent hospitalization and deemed not having capacity

## 2014-06-18 NOTE — Assessment & Plan Note (Signed)
Same as above under bi-polar

## 2014-06-18 NOTE — Assessment & Plan Note (Signed)
A1c of 6.8. Had hypoglycemic episode on 6/18. Given dextrose. fsg stable ow that she is eating. Reduced lantus dose to 10 units bedtime and SSI. Marland Kitchen A1C of 6.8; on a statin

## 2014-06-22 ENCOUNTER — Emergency Department (HOSPITAL_COMMUNITY): Payer: Medicare Other

## 2014-06-22 ENCOUNTER — Emergency Department (HOSPITAL_COMMUNITY)
Admission: EM | Admit: 2014-06-22 | Discharge: 2014-06-22 | Disposition: A | Discharge status: Home / Self Care | Payer: Medicare Other | Attending: Emergency Medicine | Admitting: Emergency Medicine

## 2014-06-22 ENCOUNTER — Encounter (HOSPITAL_COMMUNITY): Payer: Self-pay | Admitting: Emergency Medicine

## 2014-06-22 DIAGNOSIS — Z79899 Other long term (current) drug therapy: Secondary | ICD-10-CM | POA: Insufficient documentation

## 2014-06-22 DIAGNOSIS — Z8659 Personal history of other mental and behavioral disorders: Secondary | ICD-10-CM | POA: Insufficient documentation

## 2014-06-22 DIAGNOSIS — F0390 Unspecified dementia without behavioral disturbance: Secondary | ICD-10-CM | POA: Insufficient documentation

## 2014-06-22 DIAGNOSIS — Z87898 Personal history of other specified conditions: Secondary | ICD-10-CM | POA: Insufficient documentation

## 2014-06-22 DIAGNOSIS — Z8673 Personal history of transient ischemic attack (TIA), and cerebral infarction without residual deficits: Secondary | ICD-10-CM | POA: Insufficient documentation

## 2014-06-22 DIAGNOSIS — Z8719 Personal history of other diseases of the digestive system: Secondary | ICD-10-CM | POA: Insufficient documentation

## 2014-06-22 DIAGNOSIS — Z794 Long term (current) use of insulin: Secondary | ICD-10-CM | POA: Insufficient documentation

## 2014-06-22 DIAGNOSIS — IMO0002 Reserved for concepts with insufficient information to code with codable children: Secondary | ICD-10-CM | POA: Insufficient documentation

## 2014-06-22 DIAGNOSIS — J45901 Unspecified asthma with (acute) exacerbation: Principal | ICD-10-CM

## 2014-06-22 DIAGNOSIS — E119 Type 2 diabetes mellitus without complications: Secondary | ICD-10-CM | POA: Insufficient documentation

## 2014-06-22 DIAGNOSIS — E7800 Pure hypercholesterolemia, unspecified: Secondary | ICD-10-CM | POA: Insufficient documentation

## 2014-06-22 DIAGNOSIS — Z9981 Dependence on supplemental oxygen: Secondary | ICD-10-CM | POA: Insufficient documentation

## 2014-06-22 DIAGNOSIS — J441 Chronic obstructive pulmonary disease with (acute) exacerbation: Secondary | ICD-10-CM | POA: Insufficient documentation

## 2014-06-22 DIAGNOSIS — I10 Essential (primary) hypertension: Secondary | ICD-10-CM | POA: Insufficient documentation

## 2014-06-22 DIAGNOSIS — E039 Hypothyroidism, unspecified: Secondary | ICD-10-CM | POA: Insufficient documentation

## 2014-06-22 DIAGNOSIS — Z792 Long term (current) use of antibiotics: Secondary | ICD-10-CM | POA: Insufficient documentation

## 2014-06-22 DIAGNOSIS — Z87891 Personal history of nicotine dependence: Secondary | ICD-10-CM | POA: Insufficient documentation

## 2014-06-22 LAB — BASIC METABOLIC PANEL
BUN: 21 mg/dL (ref 6–23)
CHLORIDE: 99 meq/L (ref 96–112)
CO2: 42 mEq/L (ref 19–32)
Calcium: 9.1 mg/dL (ref 8.4–10.5)
Creatinine, Ser: 0.92 mg/dL (ref 0.50–1.10)
GFR, EST AFRICAN AMERICAN: 70 mL/min — AB (ref >90)
GFR, EST NON AFRICAN AMERICAN: 60 mL/min — AB (ref >90)
GLUCOSE: 239 mg/dL — AB (ref 70–99)
POTASSIUM: 3.9 meq/L (ref 3.7–5.3)
Sodium: 148 mEq/L — ABNORMAL HIGH (ref 137–147)

## 2014-06-22 LAB — CBC WITH DIFFERENTIAL/PLATELET
BASOS PCT: 1 % (ref 0–1)
Basophils Absolute: 0.1 10*3/uL (ref 0.0–0.1)
Eosinophils Absolute: 0.1 10*3/uL (ref 0.0–0.7)
Eosinophils Relative: 2 % (ref 0–5)
HCT: 33 % — ABNORMAL LOW (ref 36.0–46.0)
HEMOGLOBIN: 9.6 g/dL — AB (ref 12.0–15.0)
LYMPHS ABS: 2.1 10*3/uL (ref 0.7–4.0)
Lymphocytes Relative: 29 % (ref 12–46)
MCH: 29.3 pg (ref 26.0–34.0)
MCHC: 29.1 g/dL — ABNORMAL LOW (ref 30.0–36.0)
MCV: 100.6 fL — ABNORMAL HIGH (ref 78.0–100.0)
MONOS PCT: 7 % (ref 3–12)
Monocytes Absolute: 0.5 10*3/uL (ref 0.1–1.0)
NEUTROS ABS: 4.4 10*3/uL (ref 1.7–7.7)
Neutrophils Relative %: 61 % (ref 43–77)
Platelets: 198 10*3/uL (ref 150–400)
RBC: 3.28 MIL/uL — ABNORMAL LOW (ref 3.87–5.11)
RDW: 13 % (ref 11.5–15.5)
WBC: 7.2 10*3/uL (ref 4.0–10.5)

## 2014-06-22 LAB — TROPONIN I: Troponin I: <0.3 ng/mL (ref <0.30)

## 2014-06-22 LAB — PRO B NATRIURETIC PEPTIDE: Pro B Natriuretic peptide (BNP): 1562 pg/mL — ABNORMAL HIGH (ref 0–125)

## 2014-06-22 MED ORDER — IPRATROPIUM BROMIDE 0.02 % IN SOLN
0.5000 mg | Freq: Once | RESPIRATORY_TRACT | Status: AC
  Administered 2014-06-22: 0.5 mg via RESPIRATORY_TRACT
  Filled 2014-06-22: qty 2.5

## 2014-06-22 MED ORDER — PREDNISONE 10 MG PO TABS: 60.0000 mg | ORAL_TABLET | Freq: Every day | ORAL | Status: DC

## 2014-06-22 MED ORDER — ALBUTEROL SULFATE (2.5 MG/3ML) 0.083% IN NEBU
5.0000 mg | INHALATION_SOLUTION | Freq: Once | RESPIRATORY_TRACT | Status: AC
  Administered 2014-06-22: 5 mg via RESPIRATORY_TRACT
  Filled 2014-06-22: qty 6

## 2014-06-22 NOTE — ED Notes (Signed)
Per EMS: Patient is from [facility]. It was reported from nursing staff patient had low oxygen stats this morning with Physical Therapy at 86% on 4L which is her home settings. Nurse states that patient was hallucinating rolling on the floor as well. Patient was treated for PNA 6/22. Patient is currently AXOX3, tremors noted on her hands. Denies cough, chills or SOB. Patient oxygen level at 98% currently. CBG 399

## 2014-06-22 NOTE — Discharge Instructions (Signed)
Chronic Obstructive Pulmonary Disease Exacerbation Chronic obstructive pulmonary disease (COPD) is a common lung condition in which airflow from the lungs is limited. COPD is a general term that can be used to describe many different lung problems that limit airflow, including chronic bronchitis and emphysema. COPD exacerbations are episodes when breathing symptoms become much worse and require extra treatment. Without treatment, COPD exacerbations can be life threatening, and frequent COPD exacerbations can cause further damage to your lungs. CAUSES   Respiratory infections.   Exposure to smoke.   Exposure to air pollution, chemical fumes, or dust. Sometimes there is no apparent cause or trigger. RISK FACTORS  Smoking cigarettes.  Older age.  Frequent prior COPD exacerbations. SIGNS AND SYMPTOMS   Increased coughing.   Increased thick spit (sputum) production.   Increased wheezing.   Increased shortness of breath.   Rapid breathing.   Chest tightness. DIAGNOSIS  Your medical history, a physical exam, and tests will help your health care provider make a diagnosis. Tests may include:  A chest X-ray.  Basic lab tests.  Sputum testing.  An arterial blood gas test. TREATMENT  Depending on the severity of your COPD exacerbation, you may need to be admitted to a hospital for treatment. Some of the treatments commonly used to treat COPD exacerbations are:   Antibiotic medicines.   Bronchodilators. These are drugs that expand the air passages. They may be given with an inhaler or nebulizer. Spacer devices may be needed to help improve drug delivery.  Corticosteroid medicines.  Supplemental oxygen therapy.  HOME CARE INSTRUCTIONS   Do not smoke. Quitting smoking is very important to prevent COPD from getting worse and exacerbations from happening as often.  Avoid exposure to all substances that irritate the airway, especially to tobacco smoke.   If prescribed,  take your antibiotics as directed. Finish them even if you start to feel better.  Only take over-the-counter or prescription medicines as directed by your health care provider.It is important to use correct technique with inhaled medicines.  Drink enough fluids to keep your urine clear or pale yellow (unless you have a medical condition that requires fluid restriction).  Use a cool mist vaporizer. This makes it easier to clear your chest when you cough.   If you have a home nebulizer and oxygen, continue to use them as directed.   Maintain all necessary vaccinations to prevent infections.   Exercise regularly.   Eat a healthy diet.   Keep all follow-up appointments as directed by your health care provider. SEEK IMMEDIATE MEDICAL CARE IF:  You have worsening shortness of breath.   You have trouble talking.   You have severe chest pain.  You have blood in your sputum.  You have a fever.  You have weakness, vomit repeatedly, or faint.   You feel confused.   You continue to get worse. MAKE SURE YOU:   Understand these instructions.  Will watch your condition.  Will get help right away if you are not doing well or get worse. Document Released: 10/09/2007 Document Revised: 10/02/2013 Document Reviewed: 08/16/2013 [facility] Patient Information 2015 [place], [place]. This information is not intended to replace advice given to you by your health care provider. Make sure you discuss any questions you have with your health care provider.

## 2014-06-22 NOTE — ED Notes (Signed)
Nurse will draw labs. 

## 2014-06-22 NOTE — ED Notes (Signed)
Called to room by family member who was concerned that patient was jittery. Explained to them that patient received breathing treatment that could increase her tremors. He was not satisfied with my explanation. MD made aware. Family states he wanted her to get neb, which I explained to them patient already received neb.

## 2014-06-22 NOTE — ED Provider Notes (Signed)
CSN: [id number]     Arrival date & time 06/22/14  1542 History   First MD Initiated Contact with Patient 06/22/14 1546     Chief Complaint  Patient presents with  . Hallucinations    low oxygen readings    Level V caveat: Dementia  HPI Is reported by nursing staff that the patient had a low oxygen saturation this morning when working with physical therapy.  Patient has a history of end-stage COPD on 4 L nasal cannula.  Reports that she has had some possible hallucinations as well.  She recently has been treated for healthcare associated pneumonia as.  No reports of vomiting or fever.  Blood sugar was noted by EMS to be 399.  Family reports that she's compliant with her medications including her Pulmicort and her DuoNeb's at the facility.   Past Medical History  Diagnosis Date  . Hypertension   . COPD (chronic obstructive pulmonary disease)   . Diabetes mellitus   . Elevated cholesterol   . History of ETOH abuse   . Hypothyroid   . Cirrhosis, alcoholic   . Psychosis   . Lymphoma     T cell  . Stroke     Occipital 2004  . Asthma    History reviewed. No pertinent past surgical history. No family history on file. History  Substance Use Topics  . Smoking status: Former Smoker -- 0.50 packs/day for 56 years    Types: Cigarettes    Quit date: 05/08/2013  . Smokeless tobacco: Never Used  . Alcohol Use: No     Comment: Sober since 35   OB History   Grav Para Term Preterm Abortions TAB SAB Ect Mult Living                 Review of Systems  Unable to perform ROS: Dementia      Allergies  Bee venom  Home Medications   Prior to Admission medications   Medication Sig Start Date End Date Taking? Authorizing Isabela Nardelli  budesonide (PULMICORT) 0.5 MG/2ML nebulizer solution Take 0.5 mg by nebulization daily as needed (for inflammation of lungs).   Yes Historical Leoncio Hansen, MD  cholecalciferol (VITAMIN D) 1000 UNITS tablet Take 4,000 Units by mouth every morning.   Yes  Historical Tracer Gutridge, MD  ferrous sulfate 325 (65 FE) MG tablet Take 1 tablet (325 mg total) by mouth 2 (two) times daily with a meal. 06/13/14  Yes Nishant Dhungel, MD  insulin aspart (NOVOLOG) 100 UNIT/ML injection Inject 2-8 Units into the skin 3 (three) times daily as needed for high blood sugar (sliding scale).   Yes Historical Linzey Ramser, MD  insulin glargine (LANTUS) 100 UNIT/ML injection Inject 0.1 mLs (10 Units total) into the skin at bedtime. 06/13/14  Yes Nishant Dhungel, MD  ipratropium-albuterol (DUONEB) 0.5-2.5 (3) MG/3ML SOLN Take 3 mLs by nebulization every 6 (six) hours as needed (for inflammation of lung).   Yes Historical Icy Fuhrmann, MD  levothyroxine (SYNTHROID, LEVOTHROID) 100 MCG tablet Take 100 mcg by mouth daily before breakfast.   Yes Historical Ryli Standlee, MD  Multiple Vitamin (MULTIVITAMIN WITH MINERALS) TABS Take 1 tablet by mouth daily.   Yes Historical Consuelo Thayne, MD  OLANZapine (ZYPREXA) 10 MG tablet Take 10 mg by mouth at bedtime.   Yes Historical Gionni Freese, MD  Pancrelipase, Lip-Prot-Amyl, (ZENPEP) 20000 UNITS CPEP Take 1-2 capsules by mouth 4 (four) times daily. 2 capsules in the morning, 2 capsules around lunchtime, 1 capsule in the afternoon, and 2 capsules at bedtime.   Yes  Historical Caidance Sybert, MD  simvastatin (ZOCOR) 20 MG tablet Take 20 mg by mouth every evening.   Yes Historical Gracielynn Birkel, MD  sitaGLIPtin (JANUVIA) 100 MG tablet Take 0.5 tablets (50 mg total) by mouth daily. 05/12/14  Yes Modena Jansky, MD  levofloxacin (LEVAQUIN) 750 MG tablet Take 750 mg by mouth daily.    Historical Lavanna Rog, MD  predniSONE (DELTASONE) 10 MG tablet Take 6 tablets (60 mg total) by mouth daily. 06/22/14   Hoy Morn, MD   BP 130/44  Pulse 77  Temp(Src) 97.4 F (36.3 C) (Rectal)  Resp 22  SpO2 100% Physical Exam  Nursing note and vitals reviewed. Constitutional: She appears well-developed and well-nourished. No distress.  HENT:  Head: Normocephalic and atraumatic.  Eyes: EOM  are normal.  Neck: Normal range of motion.  Cardiovascular: Normal rate, regular rhythm and normal heart sounds.   Pulmonary/Chest: Effort normal. No respiratory distress. She has wheezes. She has no rales.  Abdominal: Soft. She exhibits no distension. There is no tenderness.  Musculoskeletal: Normal range of motion.  Neurological: She is alert.  Skin: Skin is warm and dry.  Psychiatric: She has a normal mood and affect. Judgment normal.    ED Course  Procedures (including critical care time) Labs Review Labs Reviewed  CBC WITH DIFFERENTIAL - Abnormal; Notable for the following:    RBC 3.28 (*)    Hemoglobin 9.6 (*)    HCT 33.0 (*)    MCV 100.6 (*)    MCHC 29.1 (*)    All other components within normal limits  BASIC METABOLIC PANEL - Abnormal; Notable for the following:    Sodium 148 (*)    CO2 42 (*)    Glucose, Bld 239 (*)    GFR calc non Af Amer 60 (*)    GFR calc Af Amer 70 (*)    All other components within normal limits  PRO B NATRIURETIC PEPTIDE - Abnormal; Notable for the following:    Pro B Natriuretic peptide (BNP) 1562.0 (*)    All other components within normal limits  TROPONIN I    Imaging Review Dg Chest 2 View  06/22/2014   CLINICAL DATA:  Chest pain.  Shortness of breath.  EXAM: CHEST  2 VIEW  COMPARISON:  Chest radiograph 06/11/2014.  FINDINGS: Stable cardiac and mediastinal contours. Persistent heterogeneous opacity right lung base. No additional consolidative pulmonary opacities identified. No pleural effusion or pneumothorax. Regional skeleton is unremarkable.  IMPRESSION: No significant interval change heterogeneous right lung base opacities potentially representing infection. Continued radiographic follow-up is recommended to ensure resolution.   Electronically Signed   By: Lovey Newcomer M.D.   On: 06/22/2014 17:24     EKG Interpretation   Date/Time:  Sunday June 22 2014 16:04:49 EDT Ventricular Rate:  77 PR Interval:  137 QRS Duration: 78 QT  Interval:  385 QTC Calculation: 436 R Axis:   78 Text Interpretation:  Sinus rhythm Minimal ST elevation, inferior leads No  significant change was found Confirmed by CAMPOS  MD, KEVIN ([id number]) on  06/22/2014 4:07:08 PM      MDM   Final diagnoses:  COPD exacerbation    6:22 PM Mild wheezes.  Resolved after breathing treatment.  Home with steroids and ongoing albuterol treatment at her facility.  I recommended close followup with her primary care physician.  Long discussion with her friend was present with her in the emergency department regarding her care today in the emergency department.    Medical Screening Examination  completed and feel no Emergency Medical Condition present.     Hoy Morn, MD 06/22/14 Vernelle Emerald

## 2014-06-22 NOTE — ED Notes (Signed)
[facility] contacted for transoport

## 2014-06-22 NOTE — ED Notes (Signed)
Patient unable to sign family left. Report called to facility

## 2014-06-23 ENCOUNTER — Encounter: Payer: Self-pay | Admitting: Internal Medicine

## 2014-06-23 ENCOUNTER — Non-Acute Institutional Stay (SKILLED_NURSING_FACILITY): Payer: Medicare Other | Admitting: Internal Medicine

## 2014-06-23 DIAGNOSIS — D509 Iron deficiency anemia, unspecified: Secondary | ICD-10-CM

## 2014-06-23 DIAGNOSIS — E870 Hyperosmolality and hypernatremia: Secondary | ICD-10-CM

## 2014-06-23 DIAGNOSIS — J441 Chronic obstructive pulmonary disease with (acute) exacerbation: Secondary | ICD-10-CM

## 2014-06-23 NOTE — Progress Notes (Signed)
Patient ID: [name], female   DOB: Jan 07, 1941, 73 y.o.   MRN: [id number]   this is an acute visit.  Level of care skilled.  Facility AF.   Chief Complaint   Visit followup ER visit yesterday for COPD   .    HPI: Patient is 73 y.o. female who has severe dementia and is s/p hospitalization for HCAP with h/o chronic respiratory failure. --   Apparently yesterday she had decreased oxygen saturation in therapy and she was sent to the ER for evaluation where she was diagnosis initially with COPD exacerbation   - did receive nebulizers and apparently O2 sats were satisfactory and she is returned to the facility.  She is on a five-day course of prednisone it appears at 60 mg a day--this was started by the ER  She has been stable her saturations have been in the 90s on oxygen.  She is on duo nebs every 6 hours when necessary as well as Pulmicort if necessary-her friend who apparenttly has been taking care of her in the past says that she did not do well with the albuterol but doest better with duo nebs and will write an order  she receives duo nebs and not albuterol by itself  Lab work apparently was baseline in the ER although I do note a sodium of 148 and  CO2 leve  42I which appears to be a bit from her baseline.  He did have a chest x-ray which showed no significant interval change from heterogeneous right lung base opacity potentially representing infection was suggesting for radiographic followup at some point -- Clinically she appears stable today.   Past Medical History   Diagnosis  Date   .  Hypertension    .  COPD (chronic obstructive pulmonary disease)    .  Diabetes mellitus    .  Elevated cholesterol    .  History of ETOH abuse    .  Hypothyroid    .  Cirrhosis, alcoholic    .  Psychosis    .  Lymphoma      T cell   .  Stroke      Occipital 2004   .  Asthma    History reviewed. No pertinent past surgical history.    Medication List         This list is  accurate as of: 06/18/14 7:12 PM. Always use your most recent med list.            budesonide 0.5 MG/2ML nebulizer solution    Commonly known as: PULMICORT    Take 0.5 mg by nebulization daily as needed (for inflammation of lungs).    cholecalciferol 1000 UNITS tablet    Commonly known as: VITAMIN D    Take 4,000 Units by mouth every morning.    ferrous sulfate 325 (65 FE) MG tablet    Take 1 tablet (325 mg total) by mouth 2 (two) times daily with a meal.    insulin glargine 100 UNIT/ML injection    Commonly known as: LANTUS    Inject 0.1 mLs (10 Units total) into the skin at bedtime.    ipratropium-albuterol 0.5-2.5 (3) MG/3ML Soln    Commonly known as: DUONEB    Take 3 mLs by nebulization every 6 (six) hours as needed (for inflammation of lung).              levothyroxine 100 MCG tablet    Commonly known as: SYNTHROID, LEVOTHROID  Take 100 mcg by mouth daily before breakfast.    multivitamin with minerals Tabs tablet    Take 1 tablet by mouth daily.    OLANZapine 10 MG tablet    Commonly known as: ZYPREXA    Take 10 mg by mouth at bedtime.    simvastatin 20 MG tablet    Commonly known as: ZOCOR    Take 20 mg by mouth every evening.    sitaGLIPtin 100 MG tablet    Commonly known as: JANUVIA    Take 0.5 tablets (50 mg total) by mouth daily.    ZENPEP 20000 UNITS Cpep    Generic drug: Pancrelipase (Lip-Prot-Amyl)    Take 1-2 capsules by mouth 4 (four) times daily. 2 capsules in the morning, 2 capsules around lunchtime, 1 capsule in the afternoon, and 2 capsules at bedtime.                   Immunization History   Administered  Date(s) Administered   .  Pneumococcal Polysaccharide-23  06/13/2014    History   Substance Use Topics   .  Smoking status:  Former Smoker -- 0.50 packs/day for 56 years     Types:  Cigarettes     Quit date:  05/08/2013   .  Smokeless tobacco:  Never Used   .  Alcohol Use:  No      Comment: Sober since 59   Family history is  noncontributory  Review of Systems  DATA OBTAINED: from patient with dementia and no c/o but may not be reliable  GENERAL:  no fevers, fatigue, appetite changes  SKIN: No itching, rash or wounds  EYES: No eye pain, redness, discharge     NOSE: No congestion, drainage or bleeding  MOUTH/THROAT: No mouth or tooth pain, No sore throat  RESPIRATORY: No cough, wheezing, SOB  CARDIAC: No chest pain, palpitations,  Baseline   lower extremity edema  GI: No abdominal pain, No N/V/D or constipation, No heartburn or reflux   MUSCULOSKELETAL: No unrelieved bone/joint pain  NEUROLOGIC: No headache, dizziness or focal weakness  PSYCHIATRIC: No overt anxiety or sadness.hx dementia.  Filed Vitals:       BP:    Pulse:    Temp:    Resp:    Physical Exam  Temperature 97.4 pulse 79 respirations 20 blood pressure 113/70 O2 saturation is 96% on oxygen   GENERAL APPEARANCE: Alert, min conversant. Appropriately groomed. No acute distress.--Sitting comfortably in her wheelchair eating lunch.    SKIN: No diaphoresis rash  HEAD: Normocephalic, atraumatic  EYES: Conjunctiva/lids clear. Pupils round, reactive. EOMs intact.   Marland Kitchen  MOUTH/THROAT: Lips w/o lesions  RESPIRATORY: Breathing is even, unlabored. Lung sounds are clear  with somewhat shallow air entry CARDIOVASCULAR: Heart RRR no murmurs, rubs or gallops--however heart sounds are distant-. 1+ peripheral edema.  GASTROINTESTINAL: Abdomen is soft, non-tender, not distended w/ normal bowel sounds   MUSCULOSKELETAL: No abnormal joints or musculature  NEUROLOGIC: Oriented X1. Cranial nerves 2-12 grossly intact. Moves all extremities  PSYCHIATRIC: dementia, no behavioral issues  Patient Active Problem List    Diagnosis  Date Noted   .  Hypokalemia  06/13/2014   .  Anemia, iron deficiency  06/13/2014   .  Advanced dementia  06/13/2014   .  Acute kidney injury  06/13/2014   .  HCAP (healthcare-associated pneumonia)  06/11/2014   .  Anemia  06/11/2014    .  Pulmonary nodule: per CT chest 06/11/14  06/11/2014   .  UTI (urinary tract infection)  05/10/2014   .  Hypotension  05/08/2014   .  Hyperglycemia  05/08/2014   .  Leukocytosis  05/08/2014   .  AKI (acute kidney injury)  05/08/2014   .  Dehydration  05/08/2014   .  Septic shock(785.52)  10/08/2012   .  Atypical chest pain  09/01/2012   .  SOB (shortness of breath)  09/01/2012   .  Wheezing  09/01/2012   .  Elevated troponin  09/01/2012   .  Delirium  09/01/2012   .  COPD (chronic obstructive pulmonary disease)  07/19/2012   .  Smoker  07/19/2012   .  RESPIRATORY FAILURE, CHRONIC  07/25/2008   .  WEIGHT LOSS  04/30/2007   .  LYMPHOMA, T-CELL, CUTANEOUS  01/03/2007   .  HYPOTHYROIDISM  01/03/2007   .  DIABETES MELLITUS, TYPE II  01/03/2007   .  DYSLIPIDEMIA  01/03/2007   .  BIPOLAR AFFECTIVE DISORDER  01/03/2007   .  CIRRHOSIS, ALCOHOLIC  67/11/4579   .  CEREBROVASCULAR ACCIDENT, HX OF  01/03/2007   CBC  06/22/2014.  WBC 7.2 hemoglobin 9.6 platelets 190.  Sodium 148 potassium 3.9 BUN 21 creatinine 0.92 CO2 level 42.     Component  Value  Date/Time    WBC  6.9  06/13/2014 1129    RBC  2.90*  06/13/2014 1129    HGB  8.7*  06/13/2014 1129    HCT  28.0*  06/13/2014 1129    PLT  158  06/13/2014 1129    MCV  96.6  06/13/2014 1129    LYMPHSABS  3.3  06/12/2014 0446    MONOABS  0.6  06/12/2014 0446    EOSABS  0.4  06/12/2014 0446    BASOSABS  0.0  06/12/2014 0446   CMP    Component  Value  Date/Time    NA  145  06/15/2014 0748    K  4.2  06/15/2014 0748    CL  106  06/15/2014 0748    CO2  31  06/15/2014 0748    GLUCOSE  81  06/15/2014 0748    BUN  21  06/15/2014 0748    CREATININE  1.19*  06/15/2014 0748    CALCIUM  8.4  06/15/2014 0748    PROT  5.8*  06/12/2014 0446    ALBUMIN  2.6*  06/12/2014 0446    AST  15  06/12/2014 0446    ALT  20  06/12/2014 0446    ALKPHOS  54  06/12/2014 0446    BILITOT  <0.2*  06/12/2014 0446    GFRNONAA  44*  06/15/2014 0748    GFRAA  51*  06/15/2014 0748    Assessment and Plan  HCAP (healthcare-associated pneumonia)  Placed On empiric vancomycin and Zosyn. Seen byswallow eval and suggest she is at some risk for aspiration. Recommend regular diet with thin liquids. Cultures negative. Continue O2 via nasal cannula and nebulizers.  Will need radiographic followup per chest x-ray done yesterday and also some history of pulmonary nodule she has completed Levaquin clinically appears stable.   she has been started on a short course of prednisone for suspected COPD exasperation Blood cx negative.  DIABETES MELLITUS, TYPE II  A1c of 6.8. Had hypoglycemic episode on 6/18. Given dextrose. fsg stable ow that she is eating. Reduced lantus dose to 10 units bedtime and SSI. Marland Kitchen A1C of 6.8; on a statin  Anemia  Noted to have about 2  gm drop in H&H from recent level. stool for occult blood negative. Has not had any GI w/up in past. Avoid NSAIDs. irne panel suggests iron deficiency. Added iron sulfate. Needs GI referral as outpt--HBG in  ER actually showed improvement -- update hemoglobin level  RESPIRATORY FAILURE, CHRONIC  Continue O2 and nebs --continues on prednisone five-day course BIPOLAR AFFECTIVE DISORDER  Continue home regimen of Zyprexa. Has advanced dementia and delirium . Seen by psych during recent hospitalization and deemed not having capacity  Advanced dementia  Same as above under bi-polar  DYSLIPIDEMIA  Continue zocor  Hypokalemia  Repleted in hosp  Pulmonary nodule: per CT chest 06/11/14  With severe dementia question whether to work up;consult pallative per Dr.Alexander CIRRHOSIS, ALCOHOLIC  Currently stable  Mild hyponatremia on hospital lab-will update this tomorrow--as well as CO2 level   [id number] of note greater than 25 minutes spent assessing patient-reviewing her chart- coordinating plan of care-

## 2014-06-25 ENCOUNTER — Other Ambulatory Visit (HOSPITAL_COMMUNITY): Payer: Self-pay | Admitting: Respiratory Therapy

## 2014-06-25 DIAGNOSIS — J441 Chronic obstructive pulmonary disease with (acute) exacerbation: Secondary | ICD-10-CM

## 2014-06-26 ENCOUNTER — Ambulatory Visit (HOSPITAL_COMMUNITY)
Admission: RE | Admit: 2014-06-26 | Discharge: 2014-06-26 | Disposition: A | Discharge status: Home / Self Care | Payer: PRIVATE HEALTH INSURANCE | Source: Ambulatory Visit | Attending: Internal Medicine | Admitting: Internal Medicine

## 2014-06-26 ENCOUNTER — Non-Acute Institutional Stay (SKILLED_NURSING_FACILITY): Payer: Medicare Other | Admitting: Internal Medicine

## 2014-06-26 DIAGNOSIS — J449 Chronic obstructive pulmonary disease, unspecified: Secondary | ICD-10-CM | POA: Insufficient documentation

## 2014-06-26 DIAGNOSIS — J4489 Other specified chronic obstructive pulmonary disease: Secondary | ICD-10-CM | POA: Insufficient documentation

## 2014-06-26 DIAGNOSIS — J9610 Chronic respiratory failure, unspecified whether with hypoxia or hypercapnia: Secondary | ICD-10-CM

## 2014-06-26 LAB — BLOOD GAS, ARTERIAL
Acid-Base Excess: 11.4 mmol/L — ABNORMAL HIGH (ref 0.0–2.0)
BICARBONATE: 37.8 meq/L — AB (ref 20.0–24.0)
DRAWN BY: 33100
O2 Content: 4 L/min
O2 Saturation: 96.6 %
PH ART: 7.318 — AB (ref 7.350–7.450)
PO2 ART: 98.4 mmHg (ref 80.0–100.0)
Patient temperature: 98.6
TCO2: 40.1 mmol/L (ref 0–100)
pCO2 arterial: 75.8 mmHg (ref 35.0–45.0)

## 2014-06-26 NOTE — Progress Notes (Signed)
Patient ID: [name], female   DOB: 1941-11-26, 73 y.o.   MRN: [id number]   this is an acute visit.  Level of care skilled.  Facility AF.  Chief Complaint     .   acute visit followup COPD   HPI: Patient is 73 y.o. female who has severe dementia and is s/p hospitalization for HCAP with h/o chronic respiratory failure. --  Apparently this weekend she had decreased oxygen saturation in therapy and she was sent to the ER for evaluation where she was diagnosis initially with COPD exacerbation - did receive nebulizers and apparently O2 sats were satisfactory and she  returned to the facility.  She is on a  course of prednisone it appears at 60 mg a day--this was started by the ER  She has been stable her saturations have been in the 90s on oxygen.  She is on duo nebs every 6 hours  as well as Pulmicort if necessary-her friend who apparenttly has been taking care of her in the past says that she did not do well with the albuterol but doest better with duo nebs and we've ordered these routine every 6 hours  Lab work apparently was baseline in the ER although I did note a sodium of 148 and CO2 leve 42I which appears to be a bit up from her baseline.   did have a chest x-ray which showed no significant interval change from heterogeneous right lung base opacity potentially representing infection was suggesting for radiographic followup at some point  Clinically she remained stable has been in order to try to keep her O2 saturations around 90% secondary to her elevated CO2 level.  Blood gases also were ordered which showed a pH of 7.318 -- a base of  0.5 and HCO3 of 37.8-this was discussed with Dr. Sheppard Coil via phone she was made aware of the results of the lab--and again routine duo nebs have been ordered.4 CO2 level apparently was 44 which was relativels baseline with a previous lab  Patient is not complaining of any shortness of breath he continues with some lower extremity edema but appears  her weights have been stable although these will have to be monitored closely.   --  Clinically she appears stable today.  Past Medical History   Diagnosis  Date   .  Hypertension    .  COPD (chronic obstructive pulmonary disease)    .  Diabetes mellitus    .  Elevated cholesterol    .  History of ETOH abuse    .  Hypothyroid    .  Cirrhosis, alcoholic    .  Psychosis    .  Lymphoma      T cell   .  Stroke      Occipital 2004   .  Asthma    History reviewed. No pertinent past surgical history.    Medication List         t.            budesonide 0.5 MG/2ML nebulizer solution    Commonly known as: PULMICORT    Take 0.5 mg by nebulization daily as needed (for inflammation of lungs).    cholecalciferol 1000 UNITS tablet    Commonly known as: VITAMIN D    Take 4,000 Units by mouth every morning.    ferrous sulfate 325 (65 FE) MG tablet    Take 1 tablet (325 mg total) by mouth 2 (two) times daily with a meal.  insulin glargine 100 UNIT/ML injection    Commonly known as: LANTUS    Inject 0.1 mLs (10 Units total) into the skin at bedtime.    ipratropium-albuterol 0.5-2.5 (3) MG/3ML Soln    Commonly known as: DUONEB    Take 3 mLs by nebulization every 6 (six) hours  (for inflammation of lung).             levothyroxine 100 MCG tablet    Commonly known as: SYNTHROID, LEVOTHROID    Take 100 mcg by mouth daily before breakfast.    multivitamin with minerals Tabs tablet    Take 1 tablet by mouth daily.    OLANZapine 10 MG tablet    Commonly known as: ZYPREXA    Take 10 mg by mouth at bedtime.    simvastatin 20 MG tablet    Commonly known as: ZOCOR    Take 20 mg by mouth every evening.    sitaGLIPtin 100 MG tablet    Commonly known as: JANUVIA    Take 0.5 tablets (50 mg total) by mouth daily.    ZENPEP 20000 UNITS Cpep    Generic drug: Pancrelipase (Lip-Prot-Amyl)    Take 1-2 capsules by mouth 4 (four) times daily. 2 capsules in the morning, 2 capsules around  lunchtime, 1 capsule in the afternoon, and 2 capsules at bedtime.                  Immunization History   Administered  Date(s) Administered   .  Pneumococcal Polysaccharide-23  06/13/2014    History   Substance Use Topics   .  Smoking status:  Former Smoker -- 0.50 packs/day for 56 years     Types:  Cigarettes     Quit date:  05/08/2013   .  Smokeless tobacco:  Never Used   .  Alcohol Use:  No      Comment: Sober since 40   Family history is noncontributory  Review of Systems  DATA OBTAINED: from patient with dementia and no c/o but may not be reliable  GENERAL: no fevers, fatigue, appetite changes  SKIN: No itching, rash or wounds  EYES: No eye pain, redness, discharge  NOSE: No congestion, drainage or bleeding  MOUTH/THROAT: No mouth or tooth pain, No sore throat  RESPIRATORY: No cough, wheezing, SOB  CARDIAC: No chest pain, palpitations, Baseline lower extremity edema  GI: No abdominal pain, No N/V/D or constipation, No heartburn or reflux  MUSCULOSKELETAL: No unrelieved bone/joint pain  NEUROLOGIC: No headache, dizziness or focal weakness  PSYCHIATRIC: No overt anxiety or sadness.hx dementia.  Filed Vitals:                   Physical Exam   She is afebrile pulse of 82 respirations of 19 blood pressure is pending--O2 saturations have been in the 90s on chronic oxygen GENERAL APPEARANCE: Alert, min conversant. Appropriately groomed. No acute distress.--Sitting comfortably in her wheelchair .  SKIN: No diaphoresis rash  HEAD: Normocephalic, atraumatic  EYES: Conjunctiva/lids clear. Pupils round, reactive. EOMs intact.  Marland Kitchen  MOUTH/THROAT: Lips w/o lesions  RESPIRATORY: Breathing is even, unlabored. Lung sounds are clear with  shallow air entry  CARDIOVASCULAR: Heart RRR no murmurs, rubs or gallops--however heart sounds are distant-. 1+ peripheral edema.  GASTROINTESTINAL: Abdomen is soft, non-tender, not distended w/ normal bowel sounds  MUSCULOSKELETAL: No  abnormal joints or musculature  NEUROLOGIC: Oriented X1. Cranial nerves 2-12 grossly intact. Moves all extremities  PSYCHIATRIC: dementia, no behavioral issues  Patient  Active Problem List    Diagnosis  Date Noted   .  Hypokalemia  06/13/2014   .  Anemia, iron deficiency  06/13/2014   .  Advanced dementia  06/13/2014   .  Acute kidney injury  06/13/2014   .  HCAP (healthcare-associated pneumonia)  06/11/2014   .  Anemia  06/11/2014   .  Pulmonary nodule: per CT chest 06/11/14  06/11/2014   .  UTI (urinary tract infection)  05/10/2014   .  Hypotension  05/08/2014   .  Hyperglycemia  05/08/2014   .  Leukocytosis  05/08/2014   .  AKI (acute kidney injury)  05/08/2014   .  Dehydration  05/08/2014   .  Septic shock(785.52)  10/08/2012   .  Atypical chest pain  09/01/2012   .  SOB (shortness of breath)  09/01/2012   .  Wheezing  09/01/2012   .  Elevated troponin  09/01/2012   .  Delirium  09/01/2012   .  COPD (chronic obstructive pulmonary disease)  07/19/2012   .  Smoker  07/19/2012   .  RESPIRATORY FAILURE, CHRONIC  07/25/2008   .  WEIGHT LOSS  04/30/2007   .  LYMPHOMA, T-CELL, CUTANEOUS  01/03/2007   .  HYPOTHYROIDISM  01/03/2007   .  DIABETES MELLITUS, TYPE II  01/03/2007   .  DYSLIPIDEMIA  01/03/2007   .  BIPOLAR AFFECTIVE DISORDER  01/03/2007   .  CIRRHOSIS, ALCOHOLIC  40/98/1191   .  CEREBROVASCULAR ACCIDENT, HX OF  01/03/2007   CBC  06/22/2014.  WBC 7.2 hemoglobin 9.6 platelets 190.  Sodium 148 potassium 3.9 BUN 21 creatinine 0.92 CO2 level 42.    Component  Value  Date/Time    WBC  6.9  06/13/2014 1129    RBC  2.90*  06/13/2014 1129    HGB  8.7*  06/13/2014 1129    HCT  28.0*  06/13/2014 1129    PLT  158  06/13/2014 1129    MCV  96.6  06/13/2014 1129    LYMPHSABS  3.3  06/12/2014 0446    MONOABS  0.6  06/12/2014 0446    EOSABS  0.4  06/12/2014 0446    BASOSABS  0.0  06/12/2014 0446   CMP    Component  Value  Date/Time    NA  145  06/15/2014 0748    K  4.2  06/15/2014 0748     CL  106  06/15/2014 0748    CO2  31  06/15/2014 0748    GLUCOSE  81  06/15/2014 0748    BUN  21  06/15/2014 0748    CREATININE  1.19*  06/15/2014 0748    CALCIUM  8.4  06/15/2014 0748    PROT  5.8*  06/12/2014 0446    ALBUMIN  2.6*  06/12/2014 0446    AST  15  06/12/2014 0446    ALT  20  06/12/2014 0446    ALKPHOS  54  06/12/2014 0446    BILITOT  <0.2*  06/12/2014 0446    GFRNONAA  44*  06/15/2014 0748    GFRAA  51*  06/15/2014 0748   Assessment and Plan  HCAP (healthcare-associated pneumonia)  Placed On empiric vancomycin and Zosyn. Seen byswallow eval and suggest she is at some risk for aspiration. Recommend regular diet with thin liquids. Cultures negative. Continue O2 via nasal cannula and nebulizers.  Will need radiographic followup per chest x-ray done over the weekend-- and also some history  of pulmonary nodule she has completed Levaquin clinically appears stable.  she has been started on a short course of prednisone for suspected COPD exasperation--will write  order to taper this down gradually per discussion with Dr. Sheppard Coil  Blood cx negative.  DIABETES MELLITUS, TYPE II  A1c of 6.8. Had hypoglycemic episode on 6/18. Given dextrose. fsg stable ow that she is eating. Reduced lantus dose to 10 units bedtime and SSI. Marland Kitchen A1C of 6.8; on a statin  Anemia  Noted to have about 2 gm drop in H&H from recent level. stool for occult blood negative. Has not had any GI w/up in past. Avoid NSAIDs. irne panel suggests iron deficiency. Added iron sulfate. Needs GI referral as outpt--HBG in ER actually showed improvement --   RESPIRATORY FAILURE, CHRONIC  Continue O2 and nebs --continues on prednisone  this is being tapered down-again goal is to keep the O2 saturation around 90% her duo nebs have been made routine every 6 hours [place] home regimen of Zyprexa. Has advanced dementia and delirium . Seen by psych during recent hospitalization and deemed not having capacity --this  has been stable during her stay here Advanced dementia  Same as above under bi-polar  DYSLIPIDEMIA  Continue zocor  Hypokalemia  Repleted in hosp--- we'll update BMP  Pulmonary nodule: per CT chest 06/11/14  With severe dementia question whether to work up;consult pallative per Dr.Alexander--now will order a chest x-ray  CIRRHOSIS, ALCOHOLIC  Currently stable  Mild hyponatremia on hospital lab-will update this--as well as CO2 leve1--also monitor her weights looking for any increased fluid weight gain  l  [id number] of note greater than 25 minutes spent assessing patient-reviewing her chart discussion with her responsible party as well as consultation with Dr. Sheppard Coil via phone-and- coordinating plan of care-

## 2014-06-30 ENCOUNTER — Emergency Department (HOSPITAL_COMMUNITY): Payer: Medicare Other

## 2014-06-30 ENCOUNTER — Non-Acute Institutional Stay (SKILLED_NURSING_FACILITY): Payer: Medicare Other | Admitting: Internal Medicine

## 2014-06-30 ENCOUNTER — Inpatient Hospital Stay (HOSPITAL_COMMUNITY)
Admission: EM | Admit: 2014-06-30 | Discharge: 2014-07-03 | DRG: 190 | Disposition: A | Discharge status: Skilled Nursing Facility | Payer: Medicare Other | Attending: Family Medicine | Admitting: Family Medicine

## 2014-06-30 ENCOUNTER — Encounter (HOSPITAL_COMMUNITY): Payer: Self-pay | Admitting: Emergency Medicine

## 2014-06-30 DIAGNOSIS — R6521 Severe sepsis with septic shock: Secondary | ICD-10-CM

## 2014-06-30 DIAGNOSIS — R4182 Altered mental status, unspecified: Secondary | ICD-10-CM | POA: Diagnosis present

## 2014-06-30 DIAGNOSIS — Z66 Do not resuscitate: Secondary | ICD-10-CM | POA: Diagnosis present

## 2014-06-30 DIAGNOSIS — Z91038 Other insect allergy status: Secondary | ICD-10-CM

## 2014-06-30 DIAGNOSIS — I10 Essential (primary) hypertension: Secondary | ICD-10-CM | POA: Diagnosis present

## 2014-06-30 DIAGNOSIS — K7030 Alcoholic cirrhosis of liver without ascites: Secondary | ICD-10-CM

## 2014-06-30 DIAGNOSIS — Z87891 Personal history of nicotine dependence: Secondary | ICD-10-CM

## 2014-06-30 DIAGNOSIS — R0902 Hypoxemia: Secondary | ICD-10-CM

## 2014-06-30 DIAGNOSIS — IMO0002 Reserved for concepts with insufficient information to code with codable children: Secondary | ICD-10-CM

## 2014-06-30 DIAGNOSIS — G9340 Encephalopathy, unspecified: Secondary | ICD-10-CM | POA: Diagnosis present

## 2014-06-30 DIAGNOSIS — R0789 Other chest pain: Secondary | ICD-10-CM

## 2014-06-30 DIAGNOSIS — E872 Acidosis, unspecified: Secondary | ICD-10-CM | POA: Diagnosis present

## 2014-06-30 DIAGNOSIS — J189 Pneumonia, unspecified organism: Secondary | ICD-10-CM

## 2014-06-30 DIAGNOSIS — D72829 Elevated white blood cell count, unspecified: Secondary | ICD-10-CM

## 2014-06-30 DIAGNOSIS — R339 Retention of urine, unspecified: Secondary | ICD-10-CM | POA: Diagnosis not present

## 2014-06-30 DIAGNOSIS — C8409 Mycosis fungoides, extranodal and solid organ sites: Secondary | ICD-10-CM

## 2014-06-30 DIAGNOSIS — R911 Solitary pulmonary nodule: Secondary | ICD-10-CM

## 2014-06-30 DIAGNOSIS — R0689 Other abnormalities of breathing: Secondary | ICD-10-CM

## 2014-06-30 DIAGNOSIS — R739 Hyperglycemia, unspecified: Secondary | ICD-10-CM

## 2014-06-30 DIAGNOSIS — J9620 Acute and chronic respiratory failure, unspecified whether with hypoxia or hypercapnia: Secondary | ICD-10-CM | POA: Diagnosis present

## 2014-06-30 DIAGNOSIS — R062 Wheezing: Secondary | ICD-10-CM

## 2014-06-30 DIAGNOSIS — F17200 Nicotine dependence, unspecified, uncomplicated: Secondary | ICD-10-CM

## 2014-06-30 DIAGNOSIS — Z9981 Dependence on supplemental oxygen: Secondary | ICD-10-CM

## 2014-06-30 DIAGNOSIS — R410 Disorientation, unspecified: Secondary | ICD-10-CM

## 2014-06-30 DIAGNOSIS — J441 Chronic obstructive pulmonary disease with (acute) exacerbation: Principal | ICD-10-CM

## 2014-06-30 DIAGNOSIS — K7031 Alcoholic cirrhosis of liver with ascites: Secondary | ICD-10-CM

## 2014-06-30 DIAGNOSIS — D509 Iron deficiency anemia, unspecified: Secondary | ICD-10-CM

## 2014-06-30 DIAGNOSIS — E785 Hyperlipidemia, unspecified: Secondary | ICD-10-CM

## 2014-06-30 DIAGNOSIS — F319 Bipolar disorder, unspecified: Secondary | ICD-10-CM | POA: Diagnosis present

## 2014-06-30 DIAGNOSIS — F0390 Unspecified dementia without behavioral disturbance: Secondary | ICD-10-CM

## 2014-06-30 DIAGNOSIS — Z8679 Personal history of other diseases of the circulatory system: Secondary | ICD-10-CM

## 2014-06-30 DIAGNOSIS — J9610 Chronic respiratory failure, unspecified whether with hypoxia or hypercapnia: Secondary | ICD-10-CM

## 2014-06-30 DIAGNOSIS — J9611 Chronic respiratory failure with hypoxia: Secondary | ICD-10-CM

## 2014-06-30 DIAGNOSIS — R7989 Other specified abnormal findings of blood chemistry: Secondary | ICD-10-CM

## 2014-06-30 DIAGNOSIS — E876 Hypokalemia: Secondary | ICD-10-CM

## 2014-06-30 DIAGNOSIS — N390 Urinary tract infection, site not specified: Secondary | ICD-10-CM | POA: Diagnosis present

## 2014-06-30 DIAGNOSIS — E860 Dehydration: Secondary | ICD-10-CM

## 2014-06-30 DIAGNOSIS — J45901 Unspecified asthma with (acute) exacerbation: Principal | ICD-10-CM

## 2014-06-30 DIAGNOSIS — R634 Abnormal weight loss: Secondary | ICD-10-CM

## 2014-06-30 DIAGNOSIS — F03C0 Unspecified dementia, severe, without behavioral disturbance, psychotic disturbance, mood disturbance, and anxiety: Secondary | ICD-10-CM

## 2014-06-30 DIAGNOSIS — E119 Type 2 diabetes mellitus without complications: Secondary | ICD-10-CM | POA: Diagnosis present

## 2014-06-30 DIAGNOSIS — J9612 Chronic respiratory failure with hypercapnia: Secondary | ICD-10-CM

## 2014-06-30 DIAGNOSIS — R778 Other specified abnormalities of plasma proteins: Secondary | ICD-10-CM

## 2014-06-30 DIAGNOSIS — J449 Chronic obstructive pulmonary disease, unspecified: Secondary | ICD-10-CM

## 2014-06-30 DIAGNOSIS — Z794 Long term (current) use of insulin: Secondary | ICD-10-CM

## 2014-06-30 DIAGNOSIS — N179 Acute kidney failure, unspecified: Secondary | ICD-10-CM

## 2014-06-30 DIAGNOSIS — Z8673 Personal history of transient ischemic attack (TIA), and cerebral infarction without residual deficits: Secondary | ICD-10-CM

## 2014-06-30 DIAGNOSIS — A419 Sepsis, unspecified organism: Secondary | ICD-10-CM

## 2014-06-30 DIAGNOSIS — R0602 Shortness of breath: Secondary | ICD-10-CM

## 2014-06-30 DIAGNOSIS — E039 Hypothyroidism, unspecified: Secondary | ICD-10-CM | POA: Diagnosis present

## 2014-06-30 LAB — COMPREHENSIVE METABOLIC PANEL
ALBUMIN: 3.6 g/dL (ref 3.5–5.2)
ALK PHOS: 64 U/L (ref 39–117)
ALT: 19 U/L (ref 0–35)
ANION GAP: 8 (ref 5–15)
AST: 18 U/L (ref 0–37)
BUN: 24 mg/dL — ABNORMAL HIGH (ref 6–23)
CO2: 37 mEq/L — ABNORMAL HIGH (ref 19–32)
CREATININE: 1.13 mg/dL — AB (ref 0.50–1.10)
Calcium: 9.5 mg/dL (ref 8.4–10.5)
Chloride: 101 mEq/L (ref 96–112)
GFR calc Af Amer: 54 mL/min — ABNORMAL LOW (ref >90)
GFR calc non Af Amer: 47 mL/min — ABNORMAL LOW (ref >90)
Glucose, Bld: 186 mg/dL — ABNORMAL HIGH (ref 70–99)
POTASSIUM: 4.4 meq/L (ref 3.7–5.3)
Sodium: 146 mEq/L (ref 137–147)
TOTAL PROTEIN: 7.5 g/dL (ref 6.0–8.3)
Total Bilirubin: 0.2 mg/dL — ABNORMAL LOW (ref 0.3–1.2)

## 2014-06-30 LAB — URINALYSIS, ROUTINE W REFLEX MICROSCOPIC
Bilirubin Urine: NEGATIVE
GLUCOSE, UA: NEGATIVE mg/dL
Hgb urine dipstick: NEGATIVE
KETONES UR: NEGATIVE mg/dL
NITRITE: NEGATIVE
PH: 5.5 (ref 5.0–8.0)
Protein, ur: NEGATIVE mg/dL
SPECIFIC GRAVITY, URINE: 1.016 (ref 1.005–1.030)
Urobilinogen, UA: 0.2 mg/dL (ref 0.0–1.0)

## 2014-06-30 LAB — I-STAT ARTERIAL BLOOD GAS, ED
ACID-BASE EXCESS: 9 mmol/L — AB (ref 0.0–2.0)
Acid-Base Excess: 10 mmol/L — ABNORMAL HIGH (ref 0.0–2.0)
Bicarbonate: 37.9 mEq/L — ABNORMAL HIGH (ref 20.0–24.0)
Bicarbonate: 38.2 mEq/L — ABNORMAL HIGH (ref 20.0–24.0)
O2 Saturation: 94 %
O2 Saturation: 99 %
PCO2 ART: 74.6 mmHg — AB (ref 35.0–45.0)
PO2 ART: 81 mmHg (ref 80.0–100.0)
TCO2: 40 mmol/L (ref 0–100)
TCO2: 40 mmol/L (ref 0–100)
pCO2 arterial: 74.7 mmHg (ref 35.0–45.0)
pH, Arterial: 7.314 — ABNORMAL LOW (ref 7.350–7.450)
pH, Arterial: 7.316 — ABNORMAL LOW (ref 7.350–7.450)
pO2, Arterial: 183 mmHg — ABNORMAL HIGH (ref 80.0–100.0)

## 2014-06-30 LAB — MAGNESIUM: Magnesium: 1.6 mg/dL (ref 1.5–2.5)

## 2014-06-30 LAB — CBC WITH DIFFERENTIAL/PLATELET
BASOS ABS: 0 10*3/uL (ref 0.0–0.1)
Basophils Relative: 0 % (ref 0–1)
EOS PCT: 1 % (ref 0–5)
Eosinophils Absolute: 0.1 10*3/uL (ref 0.0–0.7)
HCT: 38.6 % (ref 36.0–46.0)
Hemoglobin: 11.4 g/dL — ABNORMAL LOW (ref 12.0–15.0)
LYMPHS ABS: 2.1 10*3/uL (ref 0.7–4.0)
Lymphocytes Relative: 24 % (ref 12–46)
MCH: 29.5 pg (ref 26.0–34.0)
MCHC: 29.5 g/dL — ABNORMAL LOW (ref 30.0–36.0)
MCV: 100 fL (ref 78.0–100.0)
Monocytes Absolute: 0.3 10*3/uL (ref 0.1–1.0)
Monocytes Relative: 3 % (ref 3–12)
NEUTROS ABS: 6.3 10*3/uL (ref 1.7–7.7)
NEUTROS PCT: 72 % (ref 43–77)
PLATELETS: 159 10*3/uL (ref 150–400)
RBC: 3.86 MIL/uL — ABNORMAL LOW (ref 3.87–5.11)
RDW: 14.2 % (ref 11.5–15.5)
WBC: 8.8 10*3/uL (ref 4.0–10.5)

## 2014-06-30 LAB — TSH: TSH: 1.17 u[IU]/mL (ref 0.350–4.500)

## 2014-06-30 LAB — AMMONIA: AMMONIA: 10 umol/L — AB (ref 11–60)

## 2014-06-30 LAB — PHOSPHORUS: PHOSPHORUS: 4.1 mg/dL (ref 2.3–4.6)

## 2014-06-30 LAB — I-STAT TROPONIN, ED: TROPONIN I, POC: 0.01 ng/mL (ref 0.00–0.08)

## 2014-06-30 LAB — URINE MICROSCOPIC-ADD ON

## 2014-06-30 LAB — I-STAT CG4 LACTIC ACID, ED: LACTIC ACID, VENOUS: 0.94 mmol/L (ref 0.5–2.2)

## 2014-06-30 LAB — MRSA PCR SCREENING: MRSA BY PCR: NEGATIVE

## 2014-06-30 MED ORDER — INSULIN ASPART 100 UNIT/ML ~~LOC~~ SOLN
0.0000 [IU] | SUBCUTANEOUS | Status: DC
  Administered 2014-06-30 – 2014-07-01 (×2): 3 [IU] via SUBCUTANEOUS

## 2014-06-30 MED ORDER — BIOTENE DRY MOUTH MT LIQD
15.0000 mL | Freq: Two times a day (BID) | OROMUCOSAL | Status: DC
Start: 2014-07-01 — End: 2014-07-03
  Administered 2014-07-01 (×2): 15 mL via OROMUCOSAL

## 2014-06-30 MED ORDER — ADULT MULTIVITAMIN W/MINERALS CH
1.0000 | ORAL_TABLET | Freq: Every day | ORAL | Status: DC
  Administered 2014-07-01 – 2014-07-03 (×3): 1 via ORAL
  Filled 2014-06-30 (×3): qty 1

## 2014-06-30 MED ORDER — SODIUM CHLORIDE 0.9 % IJ SOLN: 3.0000 mL | INTRAMUSCULAR | Status: DC | PRN

## 2014-06-30 MED ORDER — LEVOTHYROXINE SODIUM 100 MCG PO TABS
100.0000 ug | ORAL_TABLET | Freq: Every day | ORAL | Status: DC
  Administered 2014-07-01 – 2014-07-03 (×3): 100 ug via ORAL
  Filled 2014-06-30 (×5): qty 1

## 2014-06-30 MED ORDER — SODIUM CHLORIDE 0.9 % IJ SOLN
3.0000 mL | Freq: Two times a day (BID) | INTRAMUSCULAR | Status: DC
  Administered 2014-06-30 – 2014-07-03 (×5): 3 mL via INTRAVENOUS

## 2014-06-30 MED ORDER — CHLORHEXIDINE GLUCONATE 0.12 % MT SOLN
15.0000 mL | Freq: Two times a day (BID) | OROMUCOSAL | Status: DC
  Administered 2014-07-01 – 2014-07-03 (×4): 15 mL via OROMUCOSAL
  Filled 2014-06-30 (×9): qty 15

## 2014-06-30 MED ORDER — ONDANSETRON HCL 4 MG/2ML IJ SOLN: 4.0000 mg | Freq: Four times a day (QID) | INTRAMUSCULAR | Status: DC | PRN

## 2014-06-30 MED ORDER — SODIUM CHLORIDE 0.9 % IV SOLN: 250.0000 mL | INTRAVENOUS | Status: DC | PRN

## 2014-06-30 MED ORDER — INSULIN ASPART 100 UNIT/ML ~~LOC~~ SOLN: 0.0000 [IU] | Freq: Three times a day (TID) | SUBCUTANEOUS | Status: DC

## 2014-06-30 MED ORDER — FERROUS SULFATE 325 (65 FE) MG PO TABS
325.0000 mg | ORAL_TABLET | Freq: Two times a day (BID) | ORAL | Status: DC
  Administered 2014-07-01 – 2014-07-03 (×6): 325 mg via ORAL
  Filled 2014-06-30 (×8): qty 1

## 2014-06-30 MED ORDER — METHYLPREDNISOLONE SODIUM SUCC 125 MG IJ SOLR
125.0000 mg | Freq: Once | INTRAMUSCULAR | Status: AC
  Administered 2014-06-30: 125 mg via INTRAVENOUS
  Filled 2014-06-30: qty 2

## 2014-06-30 MED ORDER — ONDANSETRON HCL 4 MG PO TABS: 4.0000 mg | ORAL_TABLET | Freq: Four times a day (QID) | ORAL | Status: DC | PRN

## 2014-06-30 MED ORDER — PANCRELIPASE (LIP-PROT-AMYL) 12000-38000 UNITS PO CPEP
1.0000 | ORAL_CAPSULE | Freq: Three times a day (TID) | ORAL | Status: DC
  Administered 2014-07-01 – 2014-07-03 (×8): 1 via ORAL
  Filled 2014-06-30 (×11): qty 1

## 2014-06-30 MED ORDER — ALBUTEROL (5 MG/ML) CONTINUOUS INHALATION SOLN
10.0000 mg/h | INHALATION_SOLUTION | Freq: Once | RESPIRATORY_TRACT | Status: AC
  Administered 2014-06-30: 10 mg/h via RESPIRATORY_TRACT
  Filled 2014-06-30: qty 20

## 2014-06-30 MED ORDER — SIMVASTATIN 20 MG PO TABS
20.0000 mg | ORAL_TABLET | Freq: Every evening | ORAL | Status: DC
Start: 2014-06-30 — End: 2014-07-03
  Administered 2014-07-01 – 2014-07-03 (×3): 20 mg via ORAL
  Filled 2014-06-30 (×5): qty 1

## 2014-06-30 MED ORDER — METHYLPREDNISOLONE SODIUM SUCC 125 MG IJ SOLR
60.0000 mg | Freq: Three times a day (TID) | INTRAMUSCULAR | Status: DC
  Administered 2014-06-30 – 2014-07-03 (×8): 60 mg via INTRAVENOUS
  Filled 2014-06-30: qty 2
  Filled 2014-06-30 (×9): qty 0.96
  Filled 2014-06-30: qty 2
  Filled 2014-06-30 (×2): qty 0.96

## 2014-06-30 MED ORDER — SODIUM CHLORIDE 0.9 % IV BOLUS (SEPSIS)
500.0000 mL | Freq: Once | INTRAVENOUS | Status: AC
  Administered 2014-06-30: 500 mL via INTRAVENOUS

## 2014-06-30 MED ORDER — INSULIN GLARGINE 100 UNIT/ML ~~LOC~~ SOLN
20.0000 [IU] | Freq: Every day | SUBCUTANEOUS | Status: DC
  Filled 2014-06-30: qty 0.2

## 2014-06-30 MED ORDER — DEXTROSE 5 % IV SOLN
1.0000 g | INTRAVENOUS | Status: DC
  Administered 2014-06-30 – 2014-07-02 (×3): 1 g via INTRAVENOUS
  Filled 2014-06-30 (×5): qty 10

## 2014-06-30 MED ORDER — VITAMIN D3 25 MCG (1000 UNIT) PO TABS
4000.0000 [IU] | ORAL_TABLET | Freq: Every morning | ORAL | Status: DC
  Administered 2014-07-01 – 2014-07-03 (×3): 4000 [IU] via ORAL
  Filled 2014-06-30 (×3): qty 4

## 2014-06-30 MED ORDER — BUDESONIDE 0.5 MG/2ML IN SUSP
0.5000 mg | Freq: Every day | RESPIRATORY_TRACT | Status: DC | PRN
  Filled 2014-06-30: qty 2

## 2014-06-30 MED ORDER — OLANZAPINE 10 MG PO TABS
10.0000 mg | ORAL_TABLET | Freq: Every day | ORAL | Status: DC
  Administered 2014-07-01 – 2014-07-02 (×2): 10 mg via ORAL
  Filled 2014-06-30 (×6): qty 1

## 2014-06-30 MED ORDER — IPRATROPIUM-ALBUTEROL 0.5-2.5 (3) MG/3ML IN SOLN: 3.0000 mL | Freq: Four times a day (QID) | RESPIRATORY_TRACT | Status: DC | PRN

## 2014-06-30 NOTE — ED Notes (Signed)
Dr. Ward at bedside.

## 2014-06-30 NOTE — ED Notes (Signed)
In-and-out Cath successful. Assisted by Robie Ridge (RN).

## 2014-06-30 NOTE — ED Notes (Signed)
O2 sats fell to 85% Rm Air. Pt was placed on 2L O2 via nasal cannula. RN notified Dr. Leonides Schanz.

## 2014-06-30 NOTE — ED Notes (Signed)
Attempted report 

## 2014-06-30 NOTE — ED Notes (Signed)
Lauren LPN from nursing home called and updated. sts has been attempting to wean patient off o2 due to high CO2 levels.

## 2014-06-30 NOTE — ED Notes (Signed)
[facility] staff called requesting ED to do swallow evaluation. RN explained role of ED staff accepting response. Staff stated pt. Baseline alert, ambualtory and oriented to self, president, place and intermittent to situation. sts today at PT pt became more disoriented than baseline, and SpO2 75%, placed on 2.5L of O2. Pt sats increased to the 80's pt taken back to room and fingers warmed up. Highest sats at 86%.

## 2014-06-30 NOTE — ED Provider Notes (Addendum)
TIME SEEN: 2:23 PM  CHIEF COMPLAINT: Altered mental status  HPI: Patient is a 73 y.o. F with history of hypertension, COPD on chronic 4 L of oxygen, diabetes, hyperlipidemia, prior left parietal stroke who lives [facility] who presents by EMS for altered mental status that started this morning. Per nursing facility, patient was hypoxic off of her chronic oxygen and altered. On exam, patient has no current complaints. She is oriented to person, month, place but not year. She denies any acute medical complaints. No known history of head injury. No known history of any infectious symptoms including fever, cough, vomiting or diarrhea.  ROS: See HPI Constitutional: no fever  Eyes: no drainage  ENT: no runny nose   Cardiovascular:  no chest pain  Resp: no SOB  GI: no vomiting GU: no dysuria Integumentary: no rash  Allergy: no hives  Musculoskeletal: no leg swelling  Neurological: no slurred speech ROS otherwise negative  PAST MEDICAL HISTORY/PAST SURGICAL HISTORY:  Past Medical History  Diagnosis Date  . Hypertension   . COPD (chronic obstructive pulmonary disease)   . Diabetes mellitus   . Elevated cholesterol   . History of ETOH abuse   . Hypothyroid   . Cirrhosis, alcoholic   . Psychosis   . Lymphoma     T cell  . Stroke     Occipital 2004  . Asthma     MEDICATIONS:  Prior to Admission medications   Medication Sig Start Date End Date Taking? Authorizing Provider  budesonide (PULMICORT) 0.5 MG/2ML nebulizer solution Take 0.5 mg by nebulization daily as needed (for inflammation of lungs).   Yes Historical Provider, MD  cholecalciferol (VITAMIN D) 1000 UNITS tablet Take 4,000 Units by mouth every morning.   Yes Historical Provider, MD  ferrous sulfate 325 (65 FE) MG tablet Take 325 mg by mouth 2 (two) times daily with a meal.   Yes Historical Provider, MD  insulin aspart (NOVOLOG) 100 UNIT/ML injection Inject 2-8 Units into the skin 3 (three) times  daily as needed for high blood sugar (*0-150= 0 units, 151-200= 2 units, 201-250= 4 units, 251-300= 6 units, 301-350= 8 units, >350 give 8 units and nofity provider*).    Yes Historical Provider, MD  insulin glargine (LANTUS) 100 UNIT/ML injection Inject 10 Units into the skin at bedtime.   Yes Historical Provider, MD  ipratropium-albuterol (DUONEB) 0.5-2.5 (3) MG/3ML SOLN Take 3 mLs by nebulization every 6 (six) hours as needed (for inflammation of lungs for acute episodes).    Yes Historical Provider, MD  levothyroxine (SYNTHROID, LEVOTHROID) 100 MCG tablet Take 100 mcg by mouth daily before breakfast.   Yes Historical Provider, MD  Multiple Vitamin (MULTIVITAMIN WITH MINERALS) TABS Take 1 tablet by mouth daily.   Yes Historical Provider, MD  OLANZapine (ZYPREXA) 10 MG tablet Take 10 mg by mouth at bedtime.   Yes Historical Provider, MD  Pancrelipase, Lip-Prot-Amyl, (ZENPEP) 20000 UNITS CPEP Take 2 capsules by mouth 4 (four) times daily.    Yes Historical Provider, MD  predniSONE (DELTASONE) 10 MG tablet Take 40 mg by mouth daily with breakfast.   Yes Historical Provider, MD  simvastatin (ZOCOR) 20 MG tablet Take 20 mg by mouth every evening.   Yes Historical Provider, MD  sitaGLIPtin (JANUVIA) 50 MG tablet Take 50 mg by mouth daily.   Yes Historical Provider, MD    ALLERGIES:  Allergies  Allergen Reactions  . Bee Venom Anaphylaxis    SOCIAL HISTORY:  History  Substance  Use Topics  . Smoking status: Former Smoker -- 0.50 packs/day for 56 years    Types: Cigarettes    Quit date: 05/08/2013  . Smokeless tobacco: Never Used  . Alcohol Use: No     Comment: Sober since 1993    FAMILY HISTORY: History reviewed. No pertinent family history.  EXAM: BP 131/65  Pulse 67  Temp(Src) 97.6 F (36.4 C) (Oral)  Resp 25  Ht 5\' 4"  (1.626 m)  Wt 139 lb 12.4 oz (63.4 kg)  BMI 23.98 kg/m2  SpO2 98% CONSTITUTIONAL: Alert and oriented to person and place and month but not year and responds  appropriately to questions. Well-appearing; well-nourished HEAD: Normocephalic EYES: Conjunctivae clear, PERRL ENT: normal nose; no rhinorrhea; moist mucous membranes; pharynx without lesions noted NECK: Supple, no meningismus, no LAD  CARD: RRR; S1 and S2 appreciated; no murmurs, no clicks, no rubs, no gallops RESP: Normal chest excursion without splinting or tachypnea; breath sounds equal bilaterally, patient does have bilateral crackles, no wheezing or rhonchi, diminished at bases ABD/GI: Normal bowel sounds; non-distended; soft, non-tender, no rebound, no guarding BACK:  The back appears normal and is non-tender to palpation, there is no CVA tenderness EXT: Normal ROM in all joints; non-tender to palpation; no edema; normal capillary refill; no cyanosis    SKIN: Normal color for age and race; warm NEURO: Moves all extremities equally, sensation to light touch intact diffusely, cranial nerves II through XII intact PSYCH: The patient's mood and manner are appropriate. Grooming and personal hygiene are appropriate.  MEDICAL DECISION MAKING: Patient here with altered mental status per nursing facility that started today. She was seen in the emergency room several days ago for COPD exacerbation. She does some bilateral crackles and is diminished at her bases but is satting normally on her normal 4 L nasal cannula. She has a nonfocal neurologic exam. She has no current complaints. We'll obtain labs, chest x-ray, urine, head CT. Will give duo neb, IV fluids and reassess. We'll attempt to contact her friend, Whitney Post at [phone], who is listed as her next of kin.  ED PROGRESS: Patient's labs are unremarkable. Lactate normal. Troponin negative. She does have some CO2 retention but appears slightly worse than her prior in 2013. Her pH is slightly low at 7.314. Her friend is now at bedside he reports that sometimes she will have elevated ammonia level that will cause similar symptoms. He does not  feel that she is at her baseline. Given she has failed outpatient treatment for COPD exacerbation, will admit. We'll give continuous albuterol, Solu-Medrol and start BiPAP until she is more awake.  4:55 PM  Pt  Is still not at her baseline per her friend's report. Discussed with Dr. Dyann Kief with hospitalist service for admission to step down, inpatient. Urine and ammonia pending.   5:39 PM  Pt has been on BiPAP for approximately one hour. There is no obvious change in her mental status. Will repeat ABG she is still able to be aroused with voice, answers commands and follows questions. Do not feel she needs intubation at this time.   EKG Interpretation  Date/Time:  Monday June 30 2014 13:51:40 EDT Ventricular Rate:  66 PR Interval:  131 QRS Duration: 80 QT Interval:  430 QTC Calculation: 450 R Axis:   78 Text Interpretation:  Sinus rhythm Probable left atrial enlargement Artifact No significant changes since 2002 Confirmed by Jamol Ginyard,  DO, Nelissa Bolduc [phone]) on 06/30/2014 1:56:28 PM        Cyril Mourning  N Zhanae Proffit, DO 06/30/14 [place], DO 06/30/14 1740

## 2014-06-30 NOTE — Progress Notes (Signed)
Patient ID: [name], female   DOB: June 29, 1941, 73 y.o.   MRN: [id number]   This is an acute visit.  Level of care skilled.  Facility AF.  2 complaint acute visit secondary to altered mental status and hypoxia.  Patient is a pleasant 73 year old female who was recently admitted after hospitalization for pneumonia as well as respiratory failure with history of COPD.  She went to the ER recently for hypoxia and was discharged on a course of prednisone.  She is also on nebulizers duo nebs.  We have been monitoring her CO2 level which has been in the 48s recently most recently 3 on July 2-arterial blood gases also were done--.  Apparently today patient has been somewhat more lethargic with altered mental status more difficult to arouse she was also found to be somewhat hypoxic with O2 saturations hovering in the 80s on 3-4 L of oxygen she currently is arousable and alert although does appear somewhat lethargic she is not complaining of any shortness of breath or chest pain.  Family medical social history as been reviewed her most recent progress note 06/26/2014.  Medications have been reviewed per MAR.  Review of systems this is quite limited since patient appears to be somewhat of a poor historian at baseline and certainly with some lethargy appears somewhat confused.  She is not complaining of any shortness of breath or chest pain or headache however.  Physical exam.  Per nursing she's been afebrile the pulse is 66 respirations 18 blood pressure systolic has been in the 494W O2 saturations have been hovering in the lower 80s apparently doesn't do it at times into the 70s.  In general this is a frail elderly female in no distress lying in bed she is sleeping but easily arousable.  Her skin is warm and dry and do not somewhat of an erythematous area at the base of her left great toe and fourth right toes  Eyes pupils do appear reactive to light sclera and conjunctiva are  clea  r. Chest she does not really appear to have labored breathing but she does have decreased breath sounds diffusely there is no sign of distress really.  Oropharynx clear mucous membranes moist  Heart  distant heart sounds I got a pulse in the 60s per serial exams this appeared to be stable. Although when EMS arrived apparently had had dipped into the 40s.  Abdomen is soft nontender with positive bowel sounds.  Muscle skeletal difficult assessment since patient is somewhat lethargic but appears able to move her extremities at baseline.  Neurologic she is arousable and alert but could not appreciate any lateralizing findings cranial nerves appear grossly intact although she is quite lethargic.  Psych she does have some history of dementia as she is pleasant although very frail-appearing is arousable and does speak but minimally.  Labs.  06/26/2014.  Sodium 143 potassium 4.2 BUN 23 creatinine 1.1 CO2 44.  06/24/2014.  WBC 8.3 hemoglobin 10.1 platelets 219.  Assessment and plan.  #1-altered mental status with hypoxia-certainly with her respiratory history would be concerned about something respiratory-related-this appears to be fairly acute change for patient-we'll send to the ER for emergent evaluation.  Per serial exams she continued to be somewhat lethargic but arousable--.  [id number]-acute situation requiring hospital evaluation  .

## 2014-06-30 NOTE — H&P (Signed)
[facility] History and Physical  [name] SWF:[id number] DOB: Jul 02, 1941 DOA: 06/30/2014  Referring physician: Dr. Leonides Schanz PCP: [name], MD   Chief Complaint: hypoxia, hypercapnia and AMS  HPI: [name] is a 73 y.o. female with pmh significant for chronic resp failure, COPD, DM type 2 (on insulin), HTN, cirrhosis, psychosis, GERD and HLD; came to the hospital secondary to episode of hypoxia and AMS. Patient at SNF found to be less interactive and confused; also with O2 sat in the mid 70's. Patient recently seen in ED for COPD exacerbation and approx 3 weeks ago admitted due to PNA. No fever, productive cough, nausea, vomiting, diarrhea, HA's focal weakness or CP reported by SNF staff. Patient is obtunded and confused during evaluation at ED. CO2 is around 75 by ABG and patient is slightly acidotic; poor air movement and afebrile. [facility] called to admit patient for further evaluation and treatment. Patient initiated on BIPAP trial.   Review of Systems:  Unable to be reviewed due to confusion status  Past Medical History  Diagnosis Date  . Hypertension   . COPD (chronic obstructive pulmonary disease)   . Diabetes mellitus   . Elevated cholesterol   . History of ETOH abuse   . Hypothyroid   . Cirrhosis, alcoholic   . Psychosis   . Lymphoma     T cell  . Stroke     Occipital 2004  . Asthma    History reviewed. No pertinent past surgical history.  Social History:  reports that she quit smoking about 13 months ago. Her smoking use included Cigarettes. She has a 28 pack-year smoking history. She has never used smokeless tobacco. She reports that she does not drink alcohol or use illicit drugs.  Allergies  Allergen Reactions  . Bee Venom Anaphylaxis    FH: unable to be reviewed given confusion status.  Prior to Admission medications   Medication Sig Start Date End Date Taking? Authorizing Provider  budesonide (PULMICORT) 0.5 MG/2ML nebulizer solution  Take 0.5 mg by nebulization daily as needed (for inflammation of lungs).   Yes Historical Provider, MD  cholecalciferol (VITAMIN D) 1000 UNITS tablet Take 4,000 Units by mouth every morning.   Yes Historical Provider, MD  ferrous sulfate 325 (65 FE) MG tablet Take 325 mg by mouth 2 (two) times daily with a meal.   Yes Historical Provider, MD  insulin aspart (NOVOLOG) 100 UNIT/ML injection Inject 2-8 Units into the skin 3 (three) times daily as needed for high blood sugar (*0-150= 0 units, 151-200= 2 units, 201-250= 4 units, 251-300= 6 units, 301-350= 8 units, >350 give 8 units and nofity provider*).    Yes Historical Provider, MD  insulin glargine (LANTUS) 100 UNIT/ML injection Inject 10 Units into the skin at bedtime.   Yes Historical Provider, MD  ipratropium-albuterol (DUONEB) 0.5-2.5 (3) MG/3ML SOLN Take 3 mLs by nebulization every 6 (six) hours as needed (for inflammation of lungs for acute episodes).    Yes Historical Provider, MD  levothyroxine (SYNTHROID, LEVOTHROID) 100 MCG tablet Take 100 mcg by mouth daily before breakfast.   Yes Historical Provider, MD  Multiple Vitamin (MULTIVITAMIN WITH MINERALS) TABS Take 1 tablet by mouth daily.   Yes Historical Provider, MD  OLANZapine (ZYPREXA) 10 MG tablet Take 10 mg by mouth at bedtime.   Yes Historical Provider, MD  Pancrelipase, Lip-Prot-Amyl, (ZENPEP) 20000 UNITS CPEP Take 2 capsules by mouth 4 (four) times daily.    Yes Historical Provider, MD  predniSONE (DELTASONE) 10 MG  tablet Take 40 mg by mouth daily with breakfast.   Yes Historical Provider, MD  simvastatin (ZOCOR) 20 MG tablet Take 20 mg by mouth every evening.   Yes Historical Provider, MD  sitaGLIPtin (JANUVIA) 50 MG tablet Take 50 mg by mouth daily.   Yes Historical Provider, MD   Physical Exam: Filed Vitals:   06/30/14 1815  BP: 100/51  Pulse: 63  Temp:   Resp: 18    BP 100/51  Pulse 63  Temp(Src) 97.7 F (36.5 C) (Oral)  Resp 18  Ht 5\' 4"  (1.626 m)  Wt 63.4 kg (139 lb  12.4 oz)  BMI 23.98 kg/m2  SpO2 97%   General:  Obtunded/confused and lethargic; no fever; follow simple commands. Patient easily aroused  Eyes: PERRL, normal lids, irises & conjunctiva; no nystagmus ENT: grossly normal hearing, lips & tongue; MMM, no erythema or exudates, fair dentition  Neck: no LAD, masses or thyromegaly, no JVD Cardiovascular: positive SEM, no rubs or gallops, S1 and S2, appreciated. 1-2 ++ edema bilaterally Respiratory: scattered rhonchi; decreased air movement, mild exp wheezing Abdomen: soft, mild distended and with positive wave signs; no guarding, no rebound; positive BS Skin: no open wounds, no petechiae Musculoskeletal: grossly normal tone BUE/BLE Neurologic: grossly non-focal. Patient lethargic and confused, but able to follow simple commands and answer simple questions appropriately          Labs on Admission:  Basic Metabolic Panel:  Recent Labs Lab 06/30/14 1410  NA 146  K 4.4  CL 101  CO2 37*  GLUCOSE 186*  BUN 24*  CREATININE 1.13*  CALCIUM 9.5   Liver Function Tests:  Recent Labs Lab 06/30/14 1410  AST 18  ALT 19  ALKPHOS 64  BILITOT 0.2*  PROT 7.5  ALBUMIN 3.6    Recent Labs Lab 06/30/14 1717  AMMONIA 10*   CBC:  Recent Labs Lab 06/30/14 1410  WBC 8.8  NEUTROABS 6.3  HGB 11.4*  HCT 38.6  MCV 100.0  PLT 159   BNP (last 3 results)  Recent Labs  06/11/14 1542 06/22/14 1618  PROBNP 770.8* 1562.0*   Radiological Exams on Admission: Dg Chest 2 View  06/30/2014   CLINICAL DATA:  Hypoxia and mental status changes.  EXAM: CHEST - 2 VIEW  COMPARISON:  06/22/2014  FINDINGS: The heart size and mediastinal contours are within normal limits. There is some stable atelectasis/scarring at the right lung base. There is no evidence of pulmonary edema, consolidation, pneumothorax, nodule or pleural fluid.The visualized skeletal structures are unremarkable.  IMPRESSION: No active disease.   Electronically Signed   By: Aletta Edouard M.D.   On: 06/30/2014 15:37   Ct Head Wo Contrast  06/30/2014   CLINICAL DATA:  Altered mental status and hypoxia.  EXAM: CT HEAD WITHOUT CONTRAST  TECHNIQUE: Contiguous axial images were obtained from the base of the skull through the vertex without intravenous contrast.  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 The brain demonstrates no evidence of hemorrhage, acute infarction, edema, mass effect, extra-axial fluid collection, hydrocephalus or mass lesion. The skull is unremarkable.  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  No acute findings.   Electronically Signed   By: Aletta Edouard M.D.   On: 06/30/2014 14:55    EKG: Sinus rhythm, no acute ischemic changes appreciated  Assessment/Plan 1-acute encephalopathy/Altered mental status: appears to be secondary to hypercapnia vs UTI -will admit to step down -treat with steroids, pulmicort, duonebs and rocephin -will  try BIPAP at bedtime -ABG to be repeated in am -follow urine culture -will check TSH and B12 -ammonia level is 10  2-acute on chronic resp failure with hypercapnia and hypoxia -continue oxygen supplementation -treat COPD exacerbation as mentioned above with steroids, nebulizer and abx's -will follow clinical response -BIPAP at bedtime  3-HYPOTHYROIDISM: will check TSH and continue synthroid  4-DIABETES MELLITUS, TYPE II: continue lantus and SSI -holding oral hypoglycemic agents for now  5-BIPOLAR AFFECTIVE DISORDER: continue home meds. Overall stable  6-presumed UTI: will use rocephin empirically -follow urine culture  DVT: SCD's    Code Status: DNR Family Communication: Shanon Brow (friend, significant other) Disposition Plan: LOS > 2 midnights, stepdown, inpatient  Time spent: 50 minutes  Barton Dubois [facility] Pager [phone]  **Disclaimer: This note may have been dictated with voice recognition software. Similar sounding words can inadvertently  be transcribed and this note may contain transcription errors which may not have been corrected upon publication of note.**

## 2014-06-30 NOTE — ED Notes (Signed)
[name] EMT attempted blood draw

## 2014-06-30 NOTE — ED Notes (Signed)
Son at bedside stating that pt is having "breathing issues". Pt 100% ON 4l. PT DENIES ANY DIFFICULTY BREATHING. NAD noted. Will continue to monitor

## 2014-06-30 NOTE — ED Notes (Signed)
Dr. Leonides Schanz updating patient and visitor on plan of care.

## 2014-06-30 NOTE — ED Notes (Addendum)
Per EMS pt was laying in bed with decreased LOC, not eating or talking. Pt is from [facility]. Pt denies chest pain. VS stable. BP 138/74, P68, CBG 232. Pt alert and oriented to person, place, not time.

## 2014-06-30 NOTE — ED Notes (Addendum)
Pt taken off Bipap. Tolerating well. Pt alert and oriented x4. More talkative. Pt still pale. Radial pulses 2+

## 2014-07-01 DIAGNOSIS — R0609 Other forms of dyspnea: Secondary | ICD-10-CM

## 2014-07-01 DIAGNOSIS — R0989 Other specified symptoms and signs involving the circulatory and respiratory systems: Secondary | ICD-10-CM

## 2014-07-01 DIAGNOSIS — R0902 Hypoxemia: Secondary | ICD-10-CM

## 2014-07-01 LAB — URINE CULTURE
COLONY COUNT: NO GROWTH
CULTURE: NO GROWTH

## 2014-07-01 LAB — BASIC METABOLIC PANEL
Anion gap: 10 (ref 5–15)
BUN: 25 mg/dL — ABNORMAL HIGH (ref 6–23)
CALCIUM: 8.9 mg/dL (ref 8.4–10.5)
CO2: 35 meq/L — AB (ref 19–32)
Chloride: 103 mEq/L (ref 96–112)
Creatinine, Ser: 1.02 mg/dL (ref 0.50–1.10)
GFR calc Af Amer: 62 mL/min — ABNORMAL LOW (ref >90)
GFR calc non Af Amer: 53 mL/min — ABNORMAL LOW (ref >90)
GLUCOSE: 150 mg/dL — AB (ref 70–99)
Potassium: 4.3 mEq/L (ref 3.7–5.3)
Sodium: 148 mEq/L — ABNORMAL HIGH (ref 137–147)

## 2014-07-01 LAB — BLOOD GAS, ARTERIAL
ACID-BASE EXCESS: 10.4 mmol/L — AB (ref 0.0–2.0)
BICARBONATE: 36.5 meq/L — AB (ref 20.0–24.0)
Delivery systems: POSITIVE
Expiratory PAP: 5
FIO2: 0.4 %
Inspiratory PAP: 10
O2 Saturation: 97.4 %
PATIENT TEMPERATURE: 98.6
PO2 ART: 113 mmHg — AB (ref 80.0–100.0)
TCO2: 38.7 mmol/L (ref 0–100)
pCO2 arterial: 72 mmHg (ref 35.0–45.0)
pH, Arterial: 7.326 — ABNORMAL LOW (ref 7.350–7.450)

## 2014-07-01 LAB — VITAMIN B12: Vitamin B-12: 284 pg/mL (ref 211–911)

## 2014-07-01 LAB — GLUCOSE, CAPILLARY
GLUCOSE-CAPILLARY: 139 mg/dL — AB (ref 70–99)
GLUCOSE-CAPILLARY: 140 mg/dL — AB (ref 70–99)
GLUCOSE-CAPILLARY: 186 mg/dL — AB (ref 70–99)
GLUCOSE-CAPILLARY: 198 mg/dL — AB (ref 70–99)
GLUCOSE-CAPILLARY: 230 mg/dL — AB (ref 70–99)
GLUCOSE-CAPILLARY: 441 mg/dL — AB (ref 70–99)
Glucose-Capillary: 231 mg/dL — ABNORMAL HIGH (ref 70–99)

## 2014-07-01 MED ORDER — INSULIN ASPART 100 UNIT/ML ~~LOC~~ SOLN
0.0000 [IU] | SUBCUTANEOUS | Status: DC
  Administered 2014-07-01 (×2): 2 [IU] via SUBCUTANEOUS
  Administered 2014-07-01: 8 [IU] via SUBCUTANEOUS
  Administered 2014-07-01: 5 [IU] via SUBCUTANEOUS

## 2014-07-01 MED ORDER — SODIUM CHLORIDE 0.9 % IV SOLN
INTRAVENOUS | Status: DC
  Administered 2014-07-01: 03:00:00 via INTRAVENOUS

## 2014-07-01 MED ORDER — DEXTROSE-NACL 5-0.45 % IV SOLN: INTRAVENOUS | Status: AC

## 2014-07-01 MED ORDER — INSULIN ASPART 100 UNIT/ML ~~LOC~~ SOLN
0.0000 [IU] | Freq: Three times a day (TID) | SUBCUTANEOUS | Status: DC
  Administered 2014-07-02: 5 [IU] via SUBCUTANEOUS
  Administered 2014-07-02: 8 [IU] via SUBCUTANEOUS
  Administered 2014-07-02: 15 [IU] via SUBCUTANEOUS
  Administered 2014-07-03: 8 [IU] via SUBCUTANEOUS
  Administered 2014-07-03: 5 [IU] via SUBCUTANEOUS
  Administered 2014-07-03: 11 [IU] via SUBCUTANEOUS

## 2014-07-01 MED ORDER — INSULIN GLARGINE 100 UNIT/ML ~~LOC~~ SOLN
10.0000 [IU] | Freq: Every day | SUBCUTANEOUS | Status: DC
  Administered 2014-07-01 – 2014-07-02 (×2): 10 [IU] via SUBCUTANEOUS
  Filled 2014-07-01 (×3): qty 0.1

## 2014-07-01 NOTE — Clinical Social Work Psychosocial (Signed)
Clinical Social Work Department BRIEF PSYCHOSOCIAL ASSESSMENT 07/01/2014  Patient:  [name], [name]     Account Number:  [id number]     Admit date:  06/30/2014  Clinical Social Worker:  Lovey Newcomer  Date/Time:  07/01/2014 01:52 PM  Referred by:  Physician  Date Referred:  07/01/2014 Referred for  SNF Placement   Other Referral:   Interview type:  Patient Other interview type:   Patient and patient's friend [name] interviewed at bedside.    PSYCHOSOCIAL DATA Living Status:  ALONE Admitted from facility:  [facility] Level of care:  [place] Primary support name:  Rosanne Sack Primary support relationship to patient:  FRIEND Degree of support available:   Support is fair.    CURRENT CONCERNS Current Concerns  Post-Acute Placement   Other Concerns:    SOCIAL WORK ASSESSMENT / PLAN CSW met with patient and "friend" at bedside. Patient was alert and oriented at time of assessment but "friend" insisted on talking over patient. CSW explained that CSW will not engaged in conversation with [name] if he continues to talk over the patient. Patient states that she is from [facility] and does plan to return to the facility at discharge. CSW asked patient if she was happy with the care the she received there. Patient states that she is happy there but the friend wanted to list several complaints. The friend stated, "This is the only way I know how to put it and if you don't like it you can kiss my @$$." CSW stopped [name] and explained that his agressive tone and inappropriate language would not be tolerated by this CSW. [name] continued, "Well let me finish, you asked me a question, and you're clearly not comprehending." CSW ended conversation with [name] and explained that no questions had been directed towards him and that the patient was asked these questions directly as she is more than capable of answering them. CSW ended  conversation by confirming with patient that the plan is to return to [facility] at discharge. Patient verbalized that this was the case.   Assessment/plan status:  Psychosocial Support/Ongoing Assessment of Needs Other assessment/ plan:   Complete Fl2, Fax, PASRR   Information/referral to community resources:   CSW contact information and SNF list given    PATIENT'S/FAMILY'S RESPONSE TO PLAN OF CARE: Patient states that she plans to DC back to [facility] when ready. CSW will assist.       Liz Beach MSW, [place], [place], [phone]

## 2014-07-01 NOTE — Care Management Note (Unsigned)
    Page 1 of 1   07/01/2014     2:52:58 PM CARE MANAGEMENT NOTE 07/01/2014  Patient:  [name], [name]   Account Number:  [id number]  Date Initiated:  07/01/2014  Documentation initiated by:  GRAVES-BIGELOW,Bentzion Dauria  Subjective/Objective Assessment:   Pt admitted for AMS, hypoxia and  hypercapnia. Pt is from [facility].     Action/Plan:   CSW assisting with disposition needs.   Anticipated DC Date:  07/04/2014   Anticipated DC Plan:  [place]  CM consult      Choice offered to / List presented to:             Status of service:  In process, will continue to follow Medicare Important Message given?   (If response is "NO", the following Medicare IM given date fields will be blank) Date Medicare IM given:   Medicare IM given by:   Date Additional Medicare IM given:   Additional Medicare IM given by:    Discharge Disposition:    Per UR Regulation:  Reviewed for med. necessity/level of care/duration of stay  If discussed at [place] of Stay Meetings, dates discussed:    Comments:

## 2014-07-01 NOTE — Progress Notes (Signed)
UR Completed Garin Mata Graves-Bigelow, RN,BSN [phone]  

## 2014-07-01 NOTE — Progress Notes (Signed)
[facility] PROGRESS NOTE  [name] [facility]:[id number] DOB: 1941-12-06 DOA: 06/30/2014 PCP: [name], MD  Assessment/Plan: 1-acute encephalopathy/Altered mental status: appears to be secondary to hypercapnia vs UTI  -will monitor during the day and if stable will transfer out of stepdown -continue treatment with steroids, pulmicort, duonebs and rocephin  -will continue BIPAP at bedtime  -ABG this morning (7/7) improved -follow urine culture  -TSH WNL; B12 pending -ammonia level is 10   2-acute on chronic resp failure with hypercapnia and hypoxia  -continue oxygen supplementation during the day -continue treatment for COPD exacerbation as mentioned above with steroids, nebulizer and abx's  -will follow clinical response  -schedule BIPAP at bedtime (at least for 6 hours)   3-HYPOTHYROIDISM: continue synthroid  -TSH 1.17  4-DIABETES MELLITUS, TYPE II: continue lantus and SSI  -holding oral hypoglycemic agents for now   5-BIPOLAR AFFECTIVE DISORDER/psychosis: continue olanzapine and rest of home meds. Stable and w/o hallucinations.   6-presumed UTI: will continue rocephin empirically  -follow urine culture  7-urinary retention: foley has been placed; will attempt voiding trials starting on 7/8  Code Status: DNR  Family Communication: no family at bedside Disposition Plan: follow clinical response; will transfer out of step down if remains stable.   Consultants:  None   Procedures:  See below for x-ray reports   Antibiotics:  Rocephin   HPI/Subjective: AAOX2 (unable to remember date; but was able to say who the president was, name, place and remember name of friend/significant other); no fever; feeling better. Denies CP. Episode of urinary retention overnight, foley has been placed.  Objective: Filed Vitals:   07/01/14 0745  BP: 120/57  Pulse: 65  Temp: 97.6 F (36.4 C)  Resp: 19    Intake/Output Summary (Last 24 hours) at 07/01/14  0844 Last data filed at 07/01/14 0745  Gross per 24 hour  Intake 451.25 ml  Output    850 ml  Net -398.75 ml   Filed Weights   06/30/14 1359 06/30/14 1848 07/01/14 0500  Weight: 63.4 kg (139 lb 12.4 oz) 64.8 kg (142 lb 13.7 oz) 66.3 kg (146 lb 2.6 oz)    Exam:   General:  AAOX2 (unable to remember date; but was able to say who the president was, name, place and remember name of friend/significant other); no fever; feeling better.   Cardiovascular: no rubs or gallops, S1 and S2 appreciated  Respiratory: decrease air flow, but no wheezing or crackles appreciated  Abdomen: soft, distended, positive BS, no tenderness  Musculoskeletal: 1-2++ edema, no cyanosis  Data Reviewed: Basic Metabolic Panel:  Recent Labs Lab 06/30/14 1410 06/30/14 2132 07/01/14 0434  NA 146  --  148*  K 4.4  --  4.3  CL 101  --  103  CO2 37*  --  35*  GLUCOSE 186*  --  150*  BUN 24*  --  25*  CREATININE 1.13*  --  1.02  CALCIUM 9.5  --  8.9  MG  --  1.6  --   PHOS  --  4.1  --    Liver Function Tests:  Recent Labs Lab 06/30/14 1410  AST 18  ALT 19  ALKPHOS 64  BILITOT 0.2*  PROT 7.5  ALBUMIN 3.6    Recent Labs Lab 06/30/14 1717  AMMONIA 10*   CBC:  Recent Labs Lab 06/30/14 1410  WBC 8.8  NEUTROABS 6.3  HGB 11.4*  HCT 38.6  MCV 100.0  PLT 159   BNP (last 3 results)  Recent Labs  06/11/14 1542 06/22/14 1618  PROBNP 770.8* 1562.0*   CBG:  Recent Labs Lab 06/30/14 2140 07/01/14 0028  GLUCAP 186* 198*    Recent Results (from the past 240 hour(s))  MRSA PCR SCREENING     Status: None   Collection Time    06/30/14  6:46 PM      Result Value Ref Range Status   MRSA by PCR NEGATIVE  NEGATIVE Final   Comment:            The GeneXpert MRSA Assay (FDA     approved for NASAL specimens     only), is one component of a     comprehensive MRSA colonization     surveillance program. It is not     intended to diagnose MRSA     infection nor to guide or      monitor treatment for     MRSA infections.     Studies: Dg Chest 2 View  06/30/2014   CLINICAL DATA:  Hypoxia and mental status changes.  EXAM: CHEST - 2 VIEW  COMPARISON:  06/22/2014  FINDINGS: The heart size and mediastinal contours are within normal limits. There is some stable atelectasis/scarring at the right lung base. There is no evidence of pulmonary edema, consolidation, pneumothorax, nodule or pleural fluid.The visualized skeletal structures are unremarkable.  IMPRESSION: No active disease.   Electronically Signed   By: Aletta Edouard M.D.   On: 06/30/2014 15:37   Ct Head Wo Contrast  06/30/2014   CLINICAL DATA:  Altered mental status and hypoxia.  EXAM: CT HEAD WITHOUT CONTRAST  TECHNIQUE: Contiguous axial images were obtained from the base of the skull through the vertex without intravenous contrast.  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 The brain demonstrates no evidence of hemorrhage, acute infarction, edema, mass effect, extra-axial fluid collection, hydrocephalus or mass lesion. The skull is unremarkable.  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  No acute findings.   Electronically Signed   By: Aletta Edouard M.D.   On: 06/30/2014 14:55    Scheduled Meds: . antiseptic oral rinse  15 mL Mouth Rinse q12n4p  . cefTRIAXone (ROCEPHIN)  IV  1 g Intravenous Q24H  . chlorhexidine  15 mL Mouth Rinse BID  . cholecalciferol  4,000 Units Oral q morning - 10a  . ferrous sulfate  325 mg Oral BID WC  . insulin aspart  0-15 Units Subcutaneous Q4H  . levothyroxine  100 mcg Oral QAC breakfast  . lipase/protease/amylase  1 capsule Oral TID AC  . methylPREDNISolone (SOLU-MEDROL) injection  60 mg Intravenous 3 times per day  . multivitamin with minerals  1 tablet Oral Daily  . OLANZapine  10 mg Oral QHS  . simvastatin  20 mg Oral QPM  . sodium chloride  3 mL Intravenous Q12H   Continuous Infusions: . dextrose 5 % and 0.45% NaCl       Principal Problem:   Altered mental status Active Problems:   HYPOTHYROIDISM   DIABETES MELLITUS, TYPE II   BIPOLAR AFFECTIVE DISORDER   RESPIRATORY FAILURE, CHRONIC   UTI (urinary tract infection)   Hypercarbia   COPD exacerbation   Encephalopathy acute    Time spent: >30 minutes    Barton Dubois  [facility] Pager [phone]. If 7PM-7AM, please contact night-coverage at [email or web address], password [facility] 07/01/2014, 8:44 AM  LOS: 1 day

## 2014-07-02 LAB — GLUCOSE, CAPILLARY
GLUCOSE-CAPILLARY: 267 mg/dL — AB (ref 70–99)
GLUCOSE-CAPILLARY: 332 mg/dL — AB (ref 70–99)
GLUCOSE-CAPILLARY: 352 mg/dL — AB (ref 70–99)
GLUCOSE-CAPILLARY: 139 mg/dL — AB (ref 70–99)
Glucose-Capillary: 289 mg/dL — ABNORMAL HIGH (ref 70–99)
Glucose-Capillary: 340 mg/dL — ABNORMAL HIGH (ref 70–99)
Glucose-Capillary: 380 mg/dL — ABNORMAL HIGH (ref 70–99)

## 2014-07-02 NOTE — Progress Notes (Signed)
Report called to [place], RN on 6E. VSS, medications current to time, lunch provided,  HCPA aware of new room, will continue to monitor until transferred. elink and CMT notified.

## 2014-07-02 NOTE — Progress Notes (Signed)
[facility] PROGRESS NOTE  [name] SHF:[id number] DOB: [birth year] DOA: 06/30/2014 PCP: [name], MD  Assessment/Plan: 1-acute encephalopathy/Altered mental status: appears to be secondary to hypercapnia or UTI or both -continue treatment with steroids, pulmicort, duonebs and rocephin  -given improvement in condition will plan on discontinuing bipap at night since patient is not on cpap at home and given improvement in respiratory condition. - ABG (7/7) showing improvement - Urine culture reports no growth -TSH WNL; B12 WNL -ammonia level is 10 on last check  2-acute on chronic resp failure with hypercapnia and hypoxia  -continue oxygen supplementation during the day -continue treatment for COPD exacerbation as mentioned above with steroids, nebulizer and abx's  - d/c bipap given improvement  3-HYPOTHYROIDISM: continue synthroid  -TSH 1.17, stable  4-DIABETES MELLITUS, TYPE II: continue lantus and SSI  -eating and placed back on lantus and SSI - carb modified diet.  5-BIPOLAR AFFECTIVE DISORDER/psychosis: continue olanzapine and rest of home meds.  - Stable    6-presumed UTI: will continue rocephin empirically  -UC reporting no growth  7-urinary retention: foley has been placed; will attempt voiding trials starting on 7/8  Code Status: DNR  Family Communication: no family at bedside Disposition Plan: Telemetry. Place PT evaluation order. D/c within the next 1-2 days   Consultants:  None   Procedures:  See below for x-ray reports   Antibiotics:  Rocephin   HPI/Subjective: Patient has no new complaints. Reports breathing at baseline.   Objective: Filed Vitals:   07/02/14 0732  BP: 115/94  Pulse: 51  Temp: 98 F (36.7 C)  Resp:     Intake/Output Summary (Last 24 hours) at 07/02/14 0943 Last data filed at 07/02/14 0732  Gross per 24 hour  Intake   1030 ml  Output   1050 ml  Net    -20 ml   Filed Weights   06/30/14 1848 07/01/14  0500 07/02/14 0415  Weight: 64.8 kg (142 lb 13.7 oz) 66.3 kg (146 lb 2.6 oz) 67 kg (147 lb 11.3 oz)    Exam:   General:  AAOX3, alert and awake  Cardiovascular: no rubs or gallops, S1 and S2 appreciated  Respiratory: Clear to auscultation bilaterally, no wheezes, nasal cannula in place  Abdomen: soft, distended, positive BS, no tenderness  Musculoskeletal: No clubbing, no cyanosis  Data Reviewed: Basic Metabolic Panel:  Recent Labs Lab 06/30/14 1410 06/30/14 2132 07/01/14 0434  NA 146  --  148*  K 4.4  --  4.3  CL 101  --  103  CO2 37*  --  35*  GLUCOSE 186*  --  150*  BUN 24*  --  25*  CREATININE 1.13*  --  1.02  CALCIUM 9.5  --  8.9  MG  --  1.6  --   PHOS  --  4.1  --    Liver Function Tests:  Recent Labs Lab 06/30/14 1410  AST 18  ALT 19  ALKPHOS 64  BILITOT 0.2*  PROT 7.5  ALBUMIN 3.6    Recent Labs Lab 06/30/14 1717  AMMONIA 10*   CBC:  Recent Labs Lab 06/30/14 1410  WBC 8.8  NEUTROABS 6.3  HGB 11.4*  HCT 38.6  MCV 100.0  PLT 159   BNP (last 3 results)  Recent Labs  06/11/14 1542 06/22/14 1618  PROBNP 770.8* 1562.0*   CBG:  Recent Labs Lab 07/01/14 1229 07/01/14 1604 07/01/14 1934 07/02/14 0020 07/02/14 0144  GLUCAP 231* 267* 441* 380* 332*  Recent Results (from the past 240 hour(s))  URINE CULTURE     Status: None   Collection Time    06/30/14  4:30 PM      Result Value Ref Range Status   Specimen Description URINE, CATHETERIZED   Final   Special Requests ADDED [id number]   Final   Culture  Setup Time     Final   Value: 06/30/2014 20:26     Performed at [facility] Count     Final   Value: NO GROWTH     Performed at [facility]   Culture     Final   Value: NO GROWTH     Performed at [facility]   Report Status 07/01/2014 FINAL   Final  MRSA PCR SCREENING     Status: None   Collection Time    06/30/14  6:46 PM      Result Value Ref Range Status   MRSA by PCR NEGATIVE   NEGATIVE Final   Comment:            The GeneXpert MRSA Assay (FDA     approved for NASAL specimens     only), is one component of a     comprehensive MRSA colonization     surveillance program. It is not     intended to diagnose MRSA     infection nor to guide or     monitor treatment for     MRSA infections.     Studies: Dg Chest 2 View  06/30/2014   CLINICAL DATA:  Hypoxia and mental status changes.  EXAM: CHEST - 2 VIEW  COMPARISON:  06/22/2014  FINDINGS: The heart size and mediastinal contours are within normal limits. There is some stable atelectasis/scarring at the right lung base. There is no evidence of pulmonary edema, consolidation, pneumothorax, nodule or pleural fluid.The visualized skeletal structures are unremarkable.  IMPRESSION: No active disease.   Electronically Signed   By: Aletta Edouard M.D.   On: 06/30/2014 15:37   Ct Head Wo Contrast  06/30/2014   CLINICAL DATA:  Altered mental status and hypoxia.  EXAM: CT HEAD WITHOUT CONTRAST  TECHNIQUE: Contiguous axial images were obtained from the base of the skull through the vertex without intravenous contrast.  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 The brain demonstrates no evidence of hemorrhage, acute infarction, edema, mass effect, extra-axial fluid collection, hydrocephalus or mass lesion. The skull is unremarkable.  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  No acute findings.   Electronically Signed   By: Aletta Edouard M.D.   On: 06/30/2014 14:55    Scheduled Meds: . antiseptic oral rinse  15 mL Mouth Rinse q12n4p  . cefTRIAXone (ROCEPHIN)  IV  1 g Intravenous Q24H  . chlorhexidine  15 mL Mouth Rinse BID  . cholecalciferol  4,000 Units Oral q morning - 10a  . ferrous sulfate  325 mg Oral BID WC  . insulin aspart  0-15 Units Subcutaneous TID WC  . insulin glargine  10 Units Subcutaneous QHS  . levothyroxine  100 mcg Oral QAC breakfast  . lipase/protease/amylase  1  capsule Oral TID AC  . methylPREDNISolone (SOLU-MEDROL) injection  60 mg Intravenous 3 times per day  . multivitamin with minerals  1 tablet Oral Daily  . OLANZapine  10 mg Oral QHS  . simvastatin  20 mg Oral QPM  . sodium chloride  3 mL Intravenous Q12H  Continuous Infusions:    Principal Problem:   Altered mental status Active Problems:   HYPOTHYROIDISM   DIABETES MELLITUS, TYPE II   BIPOLAR AFFECTIVE DISORDER   RESPIRATORY FAILURE, CHRONIC   UTI (urinary tract infection)   Hypercarbia   COPD exacerbation   Encephalopathy acute    Time spent: >35 minutes    Velvet Bathe  [facility] Pager [phone]. If 7PM-7AM, please contact night-coverage at [email or web address], password [facility] 07/02/2014, 9:43 AM  LOS: 2 days

## 2014-07-02 NOTE — Progress Notes (Signed)
Results for [name], [name] (MRN [id number]) as of 07/02/2014 12:04  Ref. Range 07/01/2014 16:04 07/01/2014 19:34 07/02/2014 00:20 07/02/2014 01:44 07/02/2014 07:40  Glucose-Capillary Latest Range: 70-99 mg/dL 267 (H) 441 (H) 380 (H) 332 (H) 289 (H)   Admitted from SNF with confusion.  Has COPD, DM.  Takes Lantus 10 units daily and Novolog 2-8 units TID at home.  Current DM meds: Lantus 10 units daily, and Novolog MODERATE correction scale TID.  CBGs continuing to be greater than 180 mg/dl.  Recommend adding Novolog 3-4 units TID as meal coverage if postprandial blood sugars continue to be elevated and while on steroids. Give patient meal coverage if eating at least 50% of meals.  Will continue to follow while in hospital.  Harvel Ricks RN BSN CDE

## 2014-07-03 ENCOUNTER — Encounter: Payer: Self-pay | Admitting: Internal Medicine

## 2014-07-03 DIAGNOSIS — R0902 Hypoxemia: Secondary | ICD-10-CM | POA: Insufficient documentation

## 2014-07-03 LAB — GLUCOSE, CAPILLARY
Glucose-Capillary: 220 mg/dL — ABNORMAL HIGH (ref 70–99)
Glucose-Capillary: 322 mg/dL — ABNORMAL HIGH (ref 70–99)
Glucose-Capillary: 276 mg/dL — ABNORMAL HIGH (ref 70–99)

## 2014-07-03 MED ORDER — PREDNISONE 50 MG PO TABS: ORAL_TABLET | ORAL | Status: DC

## 2014-07-03 MED ORDER — CEFDINIR 300 MG PO CAPS: 300.0000 mg | ORAL_CAPSULE | Freq: Two times a day (BID) | ORAL | Status: DC

## 2014-07-03 NOTE — Discharge Summary (Signed)
Physician Discharge Summary  [name] NWG:[id number] DOB: 16-Feb-1941 DOA: 06/30/2014  PCP: [name], MD  Admit date: 06/30/2014 Discharge date: 07/03/2014  Time spent: > 35 minutes  Recommendations for Outpatient Follow-up:  1. Continue to monitor sodium levels  Discharge Diagnoses:  Please refer to list below.   Discharge Condition: stable  Diet recommendation: low sodium heart healthy  Filed Weights   07/01/14 0500 07/02/14 0415 07/02/14 2054  Weight: 66.3 kg (146 lb 2.6 oz) 67 kg (147 lb 11.3 oz) 66.999 kg (147 lb 11.3 oz)    History of present illness:  From HPI: [name] is a 73 y.o. female with pmh significant for chronic resp failure, COPD, DM type 2 (on insulin), HTN, cirrhosis, psychosis, GERD and HLD; came to the hospital secondary to episode of hypoxia and AMS.    Hospital Course:   1-acute encephalopathy/Altered mental status: appears to be secondary to hypercapnia or UTI or both  -continue treatment with short course of steroids, pulmicort, duonebs and omnicef as outpatient. -given improvement in condition will plan on discontinuing bipap at night since patient is not on cpap at home and given improvement in respiratory condition.  - ABG (7/7) showing improvement  - Urine culture reports no growth  -TSH WNL; B12 WNL  -ammonia level is 10 on last check   2-acute on chronic resp failure with hypercapnia and hypoxia  -continue oxygen supplementation -continue treatment for COPD exacerbation as mentioned above with steroids, nebulizer and abx's  - while in house pt required bipap and showed improvement in mentation after use. Although ABG showed only mild improvement after use.  3-HYPOTHYROIDISM: continue synthroid  -TSH 1.17, stable   4-DIABETES MELLITUS, TYPE II: continue lantus and SSI  - Continue home lantus dose. - carb modified diet.   5-BIPOLAR AFFECTIVE DISORDER/psychosis: continue olanzapine and rest of home meds.  - Stable    6-presumed UTI:  -UC reporting no growth   7-urinary retention: resolved  Procedures:  bipap  Consultations:  none  Discharge Exam: Filed Vitals:   07/03/14 0552  BP: 127/68  Pulse: 60  Temp: 98 F (36.7 C)  Resp: 18    General: pt in nad, alert and awake Cardiovascular: rrr, no mrg Respiratory: cta BL, no wheezes, breathing comfortably on [place]   Discharge Instructions You were cared for by a hospitalist during your hospital stay. If you have any questions about your discharge medications or the care you received while you were in the hospital after you are discharged, you can call the unit and asked to speak with the hospitalist on call if the hospitalist that took care of you is not available. Once you are discharged, your primary care physician will handle any further medical issues. Please note that NO REFILLS for any discharge medications will be authorized once you are discharged, as it is imperative that you return to your primary care physician (or establish a relationship with a primary care physician if you do not have one) for your aftercare needs so that they can reassess your need for medications and monitor your lab values.  Discharge Instructions   Call MD for:  difficulty breathing, headache or visual disturbances    Complete by:  As directed      Call MD for:  redness, tenderness, or signs of infection (pain, swelling, redness, odor or green/yellow discharge around incision site)    Complete by:  As directed      Call MD for:  temperature >100.4  Complete by:  As directed      Diet - low sodium heart healthy    Complete by:  As directed      Increase activity slowly    Complete by:  As directed             Medication List         budesonide 0.5 MG/2ML nebulizer solution  Commonly known as:  PULMICORT  Take 0.5 mg by nebulization daily as needed (for inflammation of lungs).     cefdinir 300 MG capsule  Commonly known as:  OMNICEF  Take 1  capsule (300 mg total) by mouth 2 (two) times daily.     cholecalciferol 1000 UNITS tablet  Commonly known as:  VITAMIN D  Take 4,000 Units by mouth every morning.     ferrous sulfate 325 (65 FE) MG tablet  Take 325 mg by mouth 2 (two) times daily with a meal.     insulin aspart 100 UNIT/ML injection  Commonly known as:  novoLOG  Inject 2-8 Units into the skin 3 (three) times daily as needed for high blood sugar (*0-150= 0 units, 151-200= 2 units, 201-250= 4 units, 251-300= 6 units, 301-350= 8 units, >350 give 8 units and nofity provider*).     insulin glargine 100 UNIT/ML injection  Commonly known as:  LANTUS  Inject 10 Units into the skin at bedtime.     ipratropium-albuterol 0.5-2.5 (3) MG/3ML Soln  Commonly known as:  DUONEB  Take 3 mLs by nebulization every 6 (six) hours as needed (for inflammation of lungs for acute episodes).     levothyroxine 100 MCG tablet  Commonly known as:  SYNTHROID, LEVOTHROID  Take 100 mcg by mouth daily before breakfast.     multivitamin with minerals Tabs tablet  Take 1 tablet by mouth daily.     OLANZapine 10 MG tablet  Commonly known as:  ZYPREXA  Take 10 mg by mouth at bedtime.     predniSONE 50 MG tablet  Commonly known as:  DELTASONE  Take 1 tablet daily for the next 4 days     simvastatin 20 MG tablet  Commonly known as:  ZOCOR  Take 20 mg by mouth every evening.     sitaGLIPtin 50 MG tablet  Commonly known as:  JANUVIA  Take 50 mg by mouth daily.     ZENPEP 20000 UNITS Cpep  Generic drug:  Pancrelipase (Lip-Prot-Amyl)  Take 2 capsules by mouth 4 (four) times daily.       Allergies  Allergen Reactions  . Bee Venom Anaphylaxis      The results of significant diagnostics from this hospitalization (including imaging, microbiology, ancillary and laboratory) are listed below for reference.    Significant Diagnostic Studies: Dg Chest 2 View  06/30/2014   CLINICAL DATA:  Hypoxia and mental status changes.  EXAM: CHEST - 2  VIEW  COMPARISON:  06/22/2014  FINDINGS: The heart size and mediastinal contours are within normal limits. There is some stable atelectasis/scarring at the right lung base. There is no evidence of pulmonary edema, consolidation, pneumothorax, nodule or pleural fluid.The visualized skeletal structures are unremarkable.  IMPRESSION: No active disease.   Electronically Signed   By: Aletta Edouard M.D.   On: 06/30/2014 15:37   Dg Chest 2 View  06/22/2014   CLINICAL DATA:  Chest pain.  Shortness of breath.  EXAM: CHEST  2 VIEW  COMPARISON:  Chest radiograph 06/11/2014.  FINDINGS: Stable cardiac and mediastinal contours. Persistent heterogeneous opacity  right lung base. No additional consolidative pulmonary opacities identified. No pleural effusion or pneumothorax. Regional skeleton is unremarkable.  IMPRESSION: No significant interval change heterogeneous right lung base opacities potentially representing infection. Continued radiographic follow-up is recommended to ensure resolution.   Electronically Signed   By: Lovey Newcomer M.D.   On: 06/22/2014 17:24   Dg Chest 2 View (if Patient Has Fever And/or Copd)  06/11/2014   CLINICAL DATA:  Shortness of Breath  EXAM: CHEST  2 VIEW  COMPARISON:  May 08, 2014  FINDINGS: There is underlying emphysematous change. There is patchy infiltrate in the right lower lobe. Elsewhere lungs are clear. Heart size is normal. Pulmonary vascularity is within normal limits. No adenopathy. There is evidence of old rib trauma on the left.  IMPRESSION: Right base infiltrate.  Underlying emphysema.   Electronically Signed   By: Lowella Grip M.D.   On: 06/11/2014 16:29   Ct Head Wo Contrast  06/30/2014   CLINICAL DATA:  Altered mental status and hypoxia.  EXAM: CT HEAD WITHOUT CONTRAST  TECHNIQUE: Contiguous axial images were obtained from the base of the skull through the vertex without intravenous contrast.  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 The brain demonstrates no evidence of hemorrhage, acute infarction, edema, mass effect, extra-axial fluid collection, hydrocephalus or mass lesion. The skull is unremarkable.  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  No acute findings.   Electronically Signed   By: Aletta Edouard M.D.   On: 06/30/2014 14:55   Ct Angio Chest W/cm &/or Wo Cm  06/11/2014   CLINICAL DATA:  Shortness of breath, elevated D-dimers.  EXAM: CT ANGIOGRAPHY CHEST WITH CONTRAST  TECHNIQUE: Multidetector CT imaging of the chest was performed using the standard protocol during bolus administration of intravenous contrast. Multiplanar CT image reconstructions and MIPs were obtained to evaluate the vascular anatomy.  CONTRAST:  135mL OMNIPAQUE IOHEXOL 350 MG/ML SOLN  COMPARISON:  Chest x-ray June 11, 2014  FINDINGS: There is no pulmonary embolus. There is no mediastinal or hilar lymphadenopathy. There are small mediastinal lymph nodes largest measures 6 mm in short axis in the aortopulmonary window, not pathologically enlarged by CT size criteria. The heart size is normal. There is no pericardial effusion. There is an 8.8 x 13.1 mm spiculated nodule in the right apex. There are emphysematous changes of both lungs. There is small area patchy consolidation of the lateral anterior right lower lobe. There is no pleural effusion. The visualized upper abdominal structures are unremarkable. No focal discrete lytic or blastic lesion is identified within the bones. There mild degenerative joint changes of the spine.  Review of the MIP images confirms the above findings.  IMPRESSION: No pulmonary embolus.  Small area of patchy consolidation of the lateral anterior right upper lobe suspicious for developing pneumonia.  A 0.8 x 13.1 mm spiculated nodule in the right apex, neoplasm is not excluded. Further evaluation with PET/CT is recommended.   Electronically Signed   By: Abelardo Diesel M.D.   On: 06/11/2014 17:56    Microbiology: Recent  Results (from the past 240 hour(s))  URINE CULTURE     Status: None   Collection Time    06/30/14  4:30 PM      Result Value Ref Range Status   Specimen Description URINE, CATHETERIZED   Final   Special Requests ADDED [id number]   Final   Culture  Setup Time     Final   Value:  06/30/2014 20:26     Performed at [facility]     Final   Value: NO GROWTH     Performed at [facility]   Culture     Final   Value: NO GROWTH     Performed at [facility]   Report Status 07/01/2014 FINAL   Final  MRSA PCR SCREENING     Status: None   Collection Time    06/30/14  6:46 PM      Result Value Ref Range Status   MRSA by PCR NEGATIVE  NEGATIVE Final   Comment:            The GeneXpert MRSA Assay (FDA     approved for NASAL specimens     only), is one component of a     comprehensive MRSA colonization     surveillance program. It is not     intended to diagnose MRSA     infection nor to guide or     monitor treatment for     MRSA infections.     Labs: Basic Metabolic Panel:  Recent Labs Lab 06/30/14 1410 06/30/14 2132 07/01/14 0434  NA 146  --  148*  K 4.4  --  4.3  CL 101  --  103  CO2 37*  --  35*  GLUCOSE 186*  --  150*  BUN 24*  --  25*  CREATININE 1.13*  --  1.02  CALCIUM 9.5  --  8.9  MG  --  1.6  --   PHOS  --  4.1  --    Liver Function Tests:  Recent Labs Lab 06/30/14 1410  AST 18  ALT 19  ALKPHOS 64  BILITOT 0.2*  PROT 7.5  ALBUMIN 3.6   No results found for this basename: LIPASE, AMYLASE,  in the last 168 hours  Recent Labs Lab 06/30/14 1717  AMMONIA 10*   CBC:  Recent Labs Lab 06/30/14 1410  WBC 8.8  NEUTROABS 6.3  HGB 11.4*  HCT 38.6  MCV 100.0  PLT 159   Cardiac Enzymes: No results found for this basename: CKTOTAL, CKMB, CKMBINDEX, TROPONINI,  in the last 168 hours BNP: BNP (last 3 results)  Recent Labs  06/11/14 1542 06/22/14 1618  PROBNP 770.8* 1562.0*   CBG:  Recent Labs Lab  07/02/14 1132 07/02/14 1700 07/02/14 2141 07/03/14 0729 07/03/14 1138  GLUCAP 340* 352* 139* 220* 322*       Signed:  Velvet Bathe  [facility] 07/03/2014, 2:30 PM

## 2014-07-03 NOTE — Evaluation (Addendum)
Physical Therapy Evaluation Patient Details Name: [name] MRN: [id number] DOB: 04-16-1941 Today's Date: 07/03/2014   History of Present Illness  Pt. admitted 06/30/14 with AMS and hypoxia.  Pt. with acute encephalopathy appers to be due to hypoxemia/UTI  or both.  Pt. also with acute on chronic respiratory failure.  PMH: DM, bipolar, COPD  Clinical Impression  Pt. presents to PT with the above illness(s) and with the below problem list.  Pt. Will benefit from acute PT to address these problems in preparation for discharge back to [facility].  Pt. Was able to maintain her O2 sats on 3L O2 with walking though fatigues quickly.  Pt. Had indicated to this therapist that she came to hospital from home but this is in conflict with SW note.      Follow Up Recommendations SNF;Supervision/Assistance - 24 hour    Equipment Recommendations  None recommended by PT;Other (comment) (may  need RW if goes straight home)    Recommendations for Other Services       Precautions / Restrictions Precautions Precautions: Fall Precaution Comments: home O2 use at 2.5 l/mom      Mobility  Bed Mobility Overal bed mobility: Modified Independent             General bed mobility comments: manages well  Transfers Overall transfer level: Needs assistance Equipment used: Rolling walker (2 wheeled) Transfers: Sit to/from Stand Sit to Stand: Supervision         General transfer comment: supervision for safety  Ambulation/Gait Ambulation/Gait assistance: Min guard Ambulation Distance (Feet): 80 Feet Assistive device: Rolling walker (2 wheeled) Gait Pattern/deviations: Step-through pattern Gait velocity: decr   General Gait Details: appears stable with use of RW; ambulated on 3 L O2  Stairs            Wheelchair Mobility    Modified Rankin (Stroke Patients Only)       Balance Overall balance assessment: Needs assistance Sitting-balance support: No upper extremity  supported;Feet supported Sitting balance-Leahy Scale: Normal     Standing balance support: Bilateral upper extremity supported;During functional activity Standing balance-Leahy Scale: Poor Standing balance comment: needed support of RW for stability                             Pertinent Vitals/Pain See vitals tab Pt. Maintained O2 sats in mid 90s throughout PT session.      Home Living Family/patient expects to be discharged to:: Skilled nursing facility     Type of Home: Apartment                Prior Function Level of Independence: Independent (different from former OT assessment)   Gait / Transfers Assistance Needed: independent with oxygen, denies falls           Hand Dominance        Extremity/Trunk Assessment   Upper Extremity Assessment: Overall WFL for tasks assessed           Lower Extremity Assessment: Overall WFL for tasks assessed      Cervical / Trunk Assessment: Normal  Communication   Communication: No difficulties  Cognition Arousal/Alertness: Awake/alert Behavior During Therapy: WFL for tasks assessed/performed Overall Cognitive Status: History of cognitive impairments - at baseline Area of Impairment: Orientation;Memory Orientation Level: Time   Memory: Decreased short-term memory              General Comments      Exercises  Assessment/Plan    PT Assessment Patient needs continued PT services  PT Diagnosis Difficulty walking   PT Problem List Decreased activity tolerance;Decreased balance;Decreased mobility;Decreased knowledge of use of DME;Cardiopulmonary status limiting activity  PT Treatment Interventions DME instruction;Gait training;Functional mobility training;Therapeutic exercise;Balance training;Patient/family education   PT Goals (Current goals can be found in the Care Plan section) Acute Rehab PT Goals Patient Stated Goal: rehab then home PT Goal Formulation: With patient Time For Goal  Achievement: 07/10/14 Potential to Achieve Goals: Good    Frequency Min 3X/week   Barriers to discharge Decreased caregiver support pt. says she has no family and plans to DC to SNF before return to home environment    Co-evaluation               End of Session Equipment Utilized During Treatment: Gait belt;Oxygen Activity Tolerance: Patient tolerated treatment well Patient left: in chair;with call bell/phone within reach;with chair alarm set Nurse Communication: Mobility status         Time: 1135-1200 PT Time Calculation (min): 25 min   Charges:   PT Evaluation $Initial PT Evaluation Tier I: 1 Procedure PT Treatments $Gait Training: 8-22 mins   PT G Codes:          [name] 07/03/2014, 1:51 PM Gerlean Ren PT Acute Rehab Services [phone] [place] [phone]

## 2014-07-03 NOTE — Progress Notes (Addendum)
Patient Discharge:  Patient discharged to [facility] ambulance accompanied by 2 EMS staff. She is hemodynamically stable. IV: Peripheral IV removed before discharge, Site dry clean and intact. Telemetry: Removed before discharge. Follow-up appointments: Reviewed to the patient. Prescriptions: Reviewed medications to the patient. Belongings:  Took all her belongings with her.

## 2014-07-03 NOTE — Progress Notes (Signed)
Clinical Social Worker facilitated patient discharge including contacting patient family and facility to confirm patient discharge plans.  Clinical information faxed to facility and patient is agreeable with plan.She denies having any family to contact- only a friend- Ginger Organ. CSW contacted Mr. Dye re: her d/c per her wishes.   CSW arranged ambulance transport via [facility] back to [facility].  Patient stated that she was glad to be able to return to facility and denied any concerns or questions.  RN to call report prior to discharge.  Clinical Social Worker will sign off for now as social work intervention is no longer needed. Please consult [place] again if new need arises.  Kendell Bane, Liberal

## 2014-07-05 ENCOUNTER — Non-Acute Institutional Stay (SKILLED_NURSING_FACILITY): Payer: Medicare Other | Admitting: Internal Medicine

## 2014-07-05 ENCOUNTER — Encounter: Payer: Self-pay | Admitting: Internal Medicine

## 2014-07-05 DIAGNOSIS — E039 Hypothyroidism, unspecified: Secondary | ICD-10-CM

## 2014-07-05 DIAGNOSIS — K7030 Alcoholic cirrhosis of liver without ascites: Secondary | ICD-10-CM

## 2014-07-05 DIAGNOSIS — E119 Type 2 diabetes mellitus without complications: Secondary | ICD-10-CM

## 2014-07-05 DIAGNOSIS — J9620 Acute and chronic respiratory failure, unspecified whether with hypoxia or hypercapnia: Secondary | ICD-10-CM | POA: Insufficient documentation

## 2014-07-05 DIAGNOSIS — F319 Bipolar disorder, unspecified: Secondary | ICD-10-CM

## 2014-07-05 DIAGNOSIS — G9340 Encephalopathy, unspecified: Secondary | ICD-10-CM

## 2014-07-05 NOTE — Assessment & Plan Note (Signed)
Continue pancrease

## 2014-07-05 NOTE — Assessment & Plan Note (Signed)
Continue zyprexia

## 2014-07-05 NOTE — Assessment & Plan Note (Signed)
Continue home lantus dose.  - carb modified diet

## 2014-07-05 NOTE — Assessment & Plan Note (Signed)
continue oxygen supplementation  -continue treatment for COPD exacerbation as mentioned above with steroids, nebulizer and abx's  - while in house pt required bipap and showed improvement in mentation after use. Although ABG showed only mild improvement after use

## 2014-07-05 NOTE — Assessment & Plan Note (Signed)
continue synthroid  -TSH 1.17, stable

## 2014-07-05 NOTE — Progress Notes (Signed)
MRN: [id number] Name: [name]  Sex: female Age: 73 y.o. DOB: 1941-03-06  [place] #: [facility] Facility/Room:111 Level Of Care: SNF Provider: Inocencio Homes D Emergency Contacts: Extended Emergency Contact Information Primary Emergency Contact: Dye,[name] Address: [address]          [place], [place] [address] Johnnette Litter of [place] Phone: [phone] Relation: Other Secondary Emergency Contact: ,Marin Comment Address: [address]          [place], [place] [address] Johnnette Litter of [place] Phone: [phone] Relation: Other  Code Status: DNR  Allergies: Bee venom  Chief Complaint  Patient presents with  . nursing home admission    HPI: Patient is 73 y.o. female who has been d/c from hospital with acute respiratory failure 2/2 COPD exacerbation.  Past Medical History  Diagnosis Date  . Hypertension   . COPD (chronic obstructive pulmonary disease)   . Diabetes mellitus   . Elevated cholesterol   . History of ETOH abuse   . Hypothyroid   . Cirrhosis, alcoholic   . Psychosis   . Lymphoma     T cell  . Stroke     Occipital 2004  . Asthma     History reviewed. No pertinent past surgical history.    Medication List       This list is accurate as of: 07/05/14  6:11 PM.  Always use your most recent med list.               budesonide 0.5 MG/2ML nebulizer solution  Commonly known as:  PULMICORT  Take 0.5 mg by nebulization daily as needed (for inflammation of lungs).     cefdinir 300 MG capsule  Commonly known as:  OMNICEF  Take 1 capsule (300 mg total) by mouth 2 (two) times daily.     cholecalciferol 1000 UNITS tablet  Commonly known as:  VITAMIN D  Take 4,000 Units by mouth every morning.     ferrous sulfate 325 (65 FE) MG tablet  Take 325 mg by mouth 2 (two) times daily with a meal.     insulin aspart 100 UNIT/ML injection  Commonly known as:  novoLOG  Inject 2-8 Units into the skin 3 (three) times daily as needed for  high blood sugar (*0-150= 0 units, 151-200= 2 units, 201-250= 4 units, 251-300= 6 units, 301-350= 8 units, >350 give 8 units and nofity provider*).     insulin glargine 100 UNIT/ML injection  Commonly known as:  LANTUS  Inject 10 Units into the skin at bedtime.     ipratropium-albuterol 0.5-2.5 (3) MG/3ML Soln  Commonly known as:  DUONEB  Take 3 mLs by nebulization every 6 (six) hours as needed (for inflammation of lungs for acute episodes).     levothyroxine 100 MCG tablet  Commonly known as:  SYNTHROID, LEVOTHROID  Take 100 mcg by mouth daily before breakfast.     multivitamin with minerals Tabs tablet  Take 1 tablet by mouth daily.     OLANZapine 10 MG tablet  Commonly known as:  ZYPREXA  Take 10 mg by mouth at bedtime.     predniSONE 50 MG tablet  Commonly known as:  DELTASONE  Take 1 tablet daily for the next 4 days     simvastatin 20 MG tablet  Commonly known as:  ZOCOR  Take 20 mg by mouth every evening.     sitaGLIPtin 50 MG tablet  Commonly known as:  JANUVIA  Take 50 mg by mouth daily.  ZENPEP 20000 UNITS Cpep  Generic drug:  Pancrelipase (Lip-Prot-Amyl)  Take 2 capsules by mouth 4 (four) times daily.        No orders of the defined types were placed in this encounter.    Immunization History  Administered Date(s) Administered  . Pneumococcal Polysaccharide-23 06/13/2014    History  Substance Use Topics  . Smoking status: Former Smoker -- 0.50 packs/day for 56 years    Types: Cigarettes    Quit date: 05/08/2013  . Smokeless tobacco: Never Used  . Alcohol Use: No     Comment: Sober since 20    Family history is noncontributory    Review of Systems  DATA OBTAINED: from patient; no c/o GENERAL:  no fevers, fatigue, appetite changes SKIN: No itching, rash  EYES: No eye pain, redness, discharge EARS: No earache, tinnitus, change in hearing NOSE: No congestion, drainage or bleeding  MOUTH/THROAT: No mouth or tooth pain, No sore throat   RESPIRATORY: No cough, wheezing, SOB CARDIAC: No chest pain, palpitations, lower extremity edema  GI: No abdominal pain, No N/V/D or constipation, No heartburn or reflux  GU: No dysuria, frequency or urgency, or incontinence  MUSCULOSKELETAL: No unrelieved bone/joint pain NEUROLOGIC: No headache, dizziness or focal weakness PSYCHIATRIC: No overt anxiety or sadness. Sleeps well.   Filed Vitals:   07/05/14 1536  BP: 124/62  Pulse: 62  Temp: 97.6 F (36.4 C)  Resp: 20    Physical Exam  GENERAL APPEARANCE: Alert,mod  conversant. Appropriately groomed. No acute distress.  SKIN: No diaphoresis rash HEAD: Normocephalic, atraumatic  EYES: Conjunctiva/lids clear. Pupils round, reactive. EOMs intact.  EARS: External exam WNL, canals clear. Hearing grossly normal.  NOSE: No deformity or discharge.  MOUTH/THROAT: Lips w/o lesions  RESPIRATORY: Breathing is even, unlabored. Lung sounds are clear   CARDIOVASCULAR: Heart RRR no murmurs, rubs or gallops. No peripheral edema.   GASTROINTESTINAL: Abdomen is soft, non-tender, not distended w/ normal bowel sounds. No mass, ventral or inguinal hernia. No organomegally GENITOURINARY: Bladder non tender, not distended  MUSCULOSKELETAL: No abnormal joints or musculature NEUROLOGIC:  Cranial nerves 2-12 grossly intact. Moves all extremities  PSYCHIATRIC: Mood and affect appropriate to situation, no behavioral issues  Patient Active Problem List   Diagnosis Date Noted  . Acute and chronic respiratory failure 07/05/2014  . Hypoxia 07/03/2014  . Altered mental status 06/30/2014  . Hypercarbia 06/30/2014  . COPD exacerbation 06/30/2014  . Encephalopathy acute 06/30/2014  . Hypokalemia 06/13/2014  . Anemia, iron deficiency 06/13/2014  . Advanced dementia 06/13/2014  . Acute kidney injury 06/13/2014  . HCAP (healthcare-associated pneumonia) 06/11/2014  . Anemia 06/11/2014  . Pulmonary nodule: per CT chest 06/11/14 06/11/2014  . UTI (urinary tract  infection) 05/10/2014  . Hypotension 05/08/2014  . Hyperglycemia 05/08/2014  . Leukocytosis 05/08/2014  . AKI (acute kidney injury) 05/08/2014  . Dehydration 05/08/2014  . Septic shock(785.52) 10/08/2012  . Atypical chest pain 09/01/2012  . SOB (shortness of breath) 09/01/2012  . Wheezing 09/01/2012  . Elevated troponin 09/01/2012  . Delirium 09/01/2012  . COPD (chronic obstructive pulmonary disease) 07/19/2012  . Smoker 07/19/2012  . RESPIRATORY FAILURE, CHRONIC 07/25/2008  . WEIGHT LOSS 04/30/2007  . LYMPHOMA, T-CELL, CUTANEOUS 01/03/2007  . HYPOTHYROIDISM 01/03/2007  . DIABETES MELLITUS, TYPE II 01/03/2007  . DYSLIPIDEMIA 01/03/2007  . BIPOLAR AFFECTIVE DISORDER 01/03/2007  . CIRRHOSIS, ALCOHOLIC 64/33/2951  . CEREBROVASCULAR ACCIDENT, HX OF 01/03/2007    CBC    Component Value Date/Time   WBC 8.8 06/30/2014 1410  RBC 3.86* 06/30/2014 1410   HGB 11.4* 06/30/2014 1410   HCT 38.6 06/30/2014 1410   PLT 159 06/30/2014 1410   MCV 100.0 06/30/2014 1410   LYMPHSABS 2.1 06/30/2014 1410   MONOABS 0.3 06/30/2014 1410   EOSABS 0.1 06/30/2014 1410   BASOSABS 0.0 06/30/2014 1410    CMP     Component Value Date/Time   NA 148* 07/01/2014 0434   K 4.3 07/01/2014 0434   CL 103 07/01/2014 0434   CO2 35* 07/01/2014 0434   GLUCOSE 150* 07/01/2014 0434   BUN 25* 07/01/2014 0434   CREATININE 1.02 07/01/2014 0434   CALCIUM 8.9 07/01/2014 0434   PROT 7.5 06/30/2014 1410   ALBUMIN 3.6 06/30/2014 1410   AST 18 06/30/2014 1410   ALT 19 06/30/2014 1410   ALKPHOS 64 06/30/2014 1410   BILITOT 0.2* 06/30/2014 1410   GFRNONAA 53* 07/01/2014 0434   GFRAA 62* 07/01/2014 0434    Assessment and Plan  Encephalopathy acute appears to be secondary to hypercapnia or UTI or both  -continue treatment with short course of steroids, pulmicort, duonebs and omnicef as outpatient.  -given improvement in condition will plan on discontinuing bipap at night since patient is not on cpap at home and given improvement in respiratory condition.  -  ABG (7/7) showing improvement  - Urine culture reports no growth  -TSH WNL; B12 WNL  -ammonia level is 10 on last check    Acute and chronic respiratory failure continue oxygen supplementation  -continue treatment for COPD exacerbation as mentioned above with steroids, nebulizer and abx's  - while in house pt required bipap and showed improvement in mentation after use. Although ABG showed only mild improvement after use   HYPOTHYROIDISM continue synthroid  -TSH 1.17, stable    DIABETES MELLITUS, TYPE II Continue home lantus dose.  - carb modified diet   BIPOLAR AFFECTIVE DISORDER Continue zyprexia  CIRRHOSIS, ALCOHOLIC Continue pancrease     Hennie Duos, MD

## 2014-07-05 NOTE — Assessment & Plan Note (Signed)
appears to be secondary to hypercapnia or UTI or both  -continue treatment with short course of steroids, pulmicort, duonebs and omnicef as outpatient.  -given improvement in condition will plan on discontinuing bipap at night since patient is not on cpap at home and given improvement in respiratory condition.  - ABG (7/7) showing improvement  - Urine culture reports no growth  -TSH WNL; B12 WNL  -ammonia level is 10 on last check

## 2014-07-11 ENCOUNTER — Non-Acute Institutional Stay (SKILLED_NURSING_FACILITY): Payer: Medicare Other | Admitting: Internal Medicine

## 2014-07-11 DIAGNOSIS — Z710 Person encountering health services to consult on behalf of another person: Secondary | ICD-10-CM

## 2014-07-11 NOTE — Progress Notes (Signed)
MRN: [id number] Name: [name]  Sex: female Age: 74 y.o. DOB: 10-Sep-1941  [place] #: [facility] Facility/Room:  Level Of Care: SNF Provider: Inocencio Homes D Emergency Contacts: Extended Emergency Contact Information Primary Emergency Contact: Dye,[name] Address: [address]          [place], [place] [address] Johnnette Litter of [place] Phone: [phone] Relation: Other Secondary Emergency Contact: ,Marin Comment Address: [address], [place] [address] Johnnette Litter of [place] Phone: [phone] Relation: Other  Code Status: DNR  Allergies: Bee venom  Chief Complaint  Patient presents with  . family conference without pt present    HPI: Patient is 73 y.o. female who has multiple co-morbidities whose POA had multiple questions.  Past Medical History  Diagnosis Date  . Hypertension   . COPD (chronic obstructive pulmonary disease)   . Diabetes mellitus   . Elevated cholesterol   . History of ETOH abuse   . Hypothyroid   . Cirrhosis, alcoholic   . Psychosis   . Lymphoma     T cell  . Stroke     Occipital 2004  . Asthma     History reviewed. No pertinent past surgical history.    Medication List       This list is accurate as of: 07/11/14 11:59 PM.  Always use your most recent med list.               budesonide 0.5 MG/2ML nebulizer solution  Commonly known as:  PULMICORT  Take 0.5 mg by nebulization daily as needed (for inflammation of lungs).     cefdinir 300 MG capsule  Commonly known as:  OMNICEF  Take 1 capsule (300 mg total) by mouth 2 (two) times daily.     cholecalciferol 1000 UNITS tablet  Commonly known as:  VITAMIN D  Take 4,000 Units by mouth every morning.     ferrous sulfate 325 (65 FE) MG tablet  Take 325 mg by mouth 2 (two) times daily with a meal.     insulin aspart 100 UNIT/ML injection  Commonly known as:  novoLOG  Inject 2-8 Units into the skin 3 (three) times daily as needed for high  blood sugar (*0-150= 0 units, 151-200= 2 units, 201-250= 4 units, 251-300= 6 units, 301-350= 8 units, >350 give 8 units and nofity provider*).     insulin glargine 100 UNIT/ML injection  Commonly known as:  LANTUS  Inject 10 Units into the skin at bedtime.     ipratropium-albuterol 0.5-2.5 (3) MG/3ML Soln  Commonly known as:  DUONEB  Take 3 mLs by nebulization every 6 (six) hours as needed (for inflammation of lungs for acute episodes).     levothyroxine 100 MCG tablet  Commonly known as:  SYNTHROID, LEVOTHROID  Take 100 mcg by mouth daily before breakfast.     multivitamin with minerals Tabs tablet  Take 1 tablet by mouth daily.     OLANZapine 10 MG tablet  Commonly known as:  ZYPREXA  Take 10 mg by mouth at bedtime.     predniSONE 50 MG tablet  Commonly known as:  DELTASONE  Take 1 tablet daily for the next 4 days     simvastatin 20 MG tablet  Commonly known as:  ZOCOR  Take 20 mg by mouth every evening.     sitaGLIPtin 50 MG tablet  Commonly known as:  JANUVIA  Take 50 mg by mouth daily.  ZENPEP 20000 UNITS Cpep  Generic drug:  Pancrelipase (Lip-Prot-Amyl)  Take 2 capsules by mouth 4 (four) times daily.        No orders of the defined types were placed in this encounter.    Immunization History  Administered Date(s) Administered  . Pneumococcal Polysaccharide-23 06/13/2014    History  Substance Use Topics  . Smoking status: Former Smoker -- 0.50 packs/day for 56 years    Types: Cigarettes    Quit date: 05/08/2013  . Smokeless tobacco: Never Used  . Alcohol Use: No     Comment: Sober since 1993    Review of Systems  UTO meaning ful 2/2 dementa     Filed Vitals:   07/11/14 1734  BP: 117/42  Pulse: 78  Temp: 97 F (36.1 C)  Resp: 18    Physical Exam  GENERAL APPEARANCE: Alert, nonconversant. Appropriately groomed. No acute distress  SKIN: No diaphoresis rash HEENT: Unremarkable RESPIRATORY: Breathing is even, unlabored. Lung sounds are  clear   CARDIOVASCULAR: Heart RRR no murmurs, rubs or gallops. No peripheral edema  GASTROINTESTINAL: Abdomen is soft, non-tender, not distended w/ normal bowel sounds.  GENITOURINARY: Bladder non tender, not distended  MUSCULOSKELETAL: No abnormal joints or musculature NEUROLOGIC: Cranial nerves 2-12 grossly intact PSYCHIATRIC: Mood and affect appropriate to situation, no behavioral issues  Patient Active Problem List   Diagnosis Date Noted  . Acute and chronic respiratory failure 07/05/2014  . Hypoxia 07/03/2014  . Altered mental status 06/30/2014  . Hypercarbia 06/30/2014  . COPD exacerbation 06/30/2014  . Encephalopathy acute 06/30/2014  . Hypokalemia 06/13/2014  . Anemia, iron deficiency 06/13/2014  . Advanced dementia 06/13/2014  . Acute kidney injury 06/13/2014  . HCAP (healthcare-associated pneumonia) 06/11/2014  . Anemia 06/11/2014  . Pulmonary nodule: per CT chest 06/11/14 06/11/2014  . UTI (urinary tract infection) 05/10/2014  . Hypotension 05/08/2014  . Hyperglycemia 05/08/2014  . Leukocytosis 05/08/2014  . AKI (acute kidney injury) 05/08/2014  . Dehydration 05/08/2014  . Septic shock(785.52) 10/08/2012  . Atypical chest pain 09/01/2012  . SOB (shortness of breath) 09/01/2012  . Wheezing 09/01/2012  . Elevated troponin 09/01/2012  . Delirium 09/01/2012  . COPD (chronic obstructive pulmonary disease) 07/19/2012  . Smoker 07/19/2012  . RESPIRATORY FAILURE, CHRONIC 07/25/2008  . WEIGHT LOSS 04/30/2007  . LYMPHOMA, T-CELL, CUTANEOUS 01/03/2007  . HYPOTHYROIDISM 01/03/2007  . DIABETES MELLITUS, TYPE II 01/03/2007  . DYSLIPIDEMIA 01/03/2007  . BIPOLAR AFFECTIVE DISORDER 01/03/2007  . CIRRHOSIS, ALCOHOLIC 56/43/3295  . CEREBROVASCULAR ACCIDENT, HX OF 01/03/2007    CBC    Component Value Date/Time   WBC 8.8 06/30/2014 1410   RBC 3.86* 06/30/2014 1410   HGB 11.4* 06/30/2014 1410   HCT 38.6 06/30/2014 1410   PLT 159 06/30/2014 1410   MCV 100.0 06/30/2014 1410   LYMPHSABS  2.1 06/30/2014 1410   MONOABS 0.3 06/30/2014 1410   EOSABS 0.1 06/30/2014 1410   BASOSABS 0.0 06/30/2014 1410    CMP     Component Value Date/Time   NA 148* 07/01/2014 0434   K 4.3 07/01/2014 0434   CL 103 07/01/2014 0434   CO2 35* 07/01/2014 0434   GLUCOSE 150* 07/01/2014 0434   BUN 25* 07/01/2014 0434   CREATININE 1.02 07/01/2014 0434   CALCIUM 8.9 07/01/2014 0434   PROT 7.5 06/30/2014 1410   ALBUMIN 3.6 06/30/2014 1410   AST 18 06/30/2014 1410   ALT 19 06/30/2014 1410   ALKPHOS 64 06/30/2014 1410   BILITOT 0.2* 06/30/2014 1410   GFRNONAA  53* 07/01/2014 0434   GFRAA 62* 07/01/2014 0434    Assessment and Plan  FAMILY CONFERENCE WITHOUT PT PRESENT- I have consulted pallative care 2/2 pt has multiple problems that could require investigation, most recently new pulmonary nodules, that pt wouldn't tolerate the work-up possibly, or the treatments. I want to avoid needless workups and pt's POA had multiple questions as to what pallative care meant, broadly and specifically. We spoke for a long while. I assured him that PC would definitely be speaking to him.  Time spent > 35 min  Hennie Duos, MD

## 2014-07-16 ENCOUNTER — Encounter: Payer: Self-pay | Admitting: Internal Medicine

## 2014-07-17 ENCOUNTER — Encounter: Payer: Self-pay | Admitting: Internal Medicine

## 2014-07-17 ENCOUNTER — Non-Acute Institutional Stay (SKILLED_NURSING_FACILITY): Payer: Medicare Other | Admitting: Internal Medicine

## 2014-07-17 DIAGNOSIS — R5381 Other malaise: Secondary | ICD-10-CM

## 2014-07-17 DIAGNOSIS — R5383 Other fatigue: Principal | ICD-10-CM

## 2014-07-17 DIAGNOSIS — J449 Chronic obstructive pulmonary disease, unspecified: Secondary | ICD-10-CM

## 2014-07-17 NOTE — Progress Notes (Signed)
Patient ID: [name], female   DOB: 25-Jul-1941, 73 y.o.   MRN: [id number]   This is an acute visit.  Level care skilled.  Facility AF  Chief complaint acute visit secondary to malaise.  History of present illness.  Patient is a pleasant 73 year old female with a history of significant COPD with encephalopathy and hypercapnia as well as UTIs.  She was recently treated for encephalopathy thought to be possibly multifactorial to hypercapnia or UTI-she was treated with a course of steroids which is completed she is also on Pulmicort when necessary as well as duo nebs routine-she also was treated with antibiotic but she is completed.  Since her return here she's been relatively stable however her friend thinks that she is somewhat more malaised today-she states she just doesn't feel all that well although cannot specifically complain of any shortness of breath or cough or abdominal discomfort or dysuria-she indicates she just doesn't  feel as well the last day or so  Vital signs appear to be stable.  Family medical social history as been reviewed per admission note on 07/05/2014.  Medications have been reviewed per MAR.  Review of systems.  In general no complaints of fever chills she says she feels fatigued and malaise.  Skin does not complaining of itching or rashes.  Respiratory is not complaining of any shortness breath or increased cough she is on oxygen chronically.  Cardiac does not complaining of chest pain appears to have minimal lower extremity edema.  GI does not complaining of abdominal pain there's been no nausea or vomiting diarrhea constipation concerns expressed.  GU does not complaining of dysuria.  Muscle skeletal is not complaining of any acute or significant joint pain.  Neurologic is not complaining of any headache or dizziness.  Psych appears to be certainly more alert than when I saw her last time does not appear overtly depressed she says she  feels fatigue with malaise.  To exam.  Temperature 97.6 pulse 65 respirations 20 blood pressure 119/63- 132/66-122/70--GBG 269 Was 94 this morning.  In general this is a somewhat frail elderly female in no distress lying comfortably  in bed.  She is alert and responsive.  Her skin is warm and dry.  Eyes pupils appear reactive the light sclera and conjunctiva were clear visual acuity appears grossly intact.  Oropharynx clear mucous membranes moist.  Her chest is clear to auscultation somewhat reduced breath sounds especially on the left there is no labored breathing  Heart somewhat distant heart sounds regular rate and rhythm without murmur gallop or rub she has minimal lower extremity edema.  Abdomen is soft nontender with positive bowel sounds-GU cannot really appreciate any suprapubic tenderness.  Muscle skeletal is on extremities x4 with some lower extremity weakness this is baseline.  Neurologic is grossly intact her speech is clear cranial nerves appear grossly intact.  Psych she is appropriate to situation mood and affect appear stable she is pleasant and cooperative.  Labs.  07/04/2014.  Sodium 143 potassium 4 BUN 35 creatinine 1.3 CO2 level was 34  06/30/2014.  WBC 8.8 hemoglobin 11.4 platelets 159.  Sodium 140 potassium 4.3 BUN 25 creatinine 1.02.--CO2 level was 35  TSH in hospital was 1.17.    Assessment and plan.  #1-malaise fatigue-I do not see any overt signs of a change here somewhat vague systems-there could be numerous etiologies--Will obtain lab work including CBC with differential and CMP as well as TSH tomorrow- a.m. notify provider results- also will obtain a urinalysis  and culture as well as a chest x-ray there are somewhat reduced breath sounds on the left although this does not appear to be an acute change-also monitor vital signs pulse ox every shift keep an eye on her situation  #2-history of COPD has again she is on duo nebs every 6 hours as  well as Pulmicort when necessary this appears relatively stable will obtain a chest x-ray.  #3 hypothyroidism TSH was normal in hospital she is on Synthroid again will update TSH.  History of alcoholic cirrhosis she is on pancreas-again will update liver function tests as well   [id number]  .

## 2014-07-21 ENCOUNTER — Non-Acute Institutional Stay (SKILLED_NURSING_FACILITY): Payer: Medicare Other | Admitting: Internal Medicine

## 2014-07-21 DIAGNOSIS — R5381 Other malaise: Secondary | ICD-10-CM

## 2014-07-21 DIAGNOSIS — D6489 Other specified anemias: Secondary | ICD-10-CM | POA: Insufficient documentation

## 2014-07-21 DIAGNOSIS — J441 Chronic obstructive pulmonary disease with (acute) exacerbation: Secondary | ICD-10-CM

## 2014-07-21 DIAGNOSIS — R5383 Other fatigue: Secondary | ICD-10-CM

## 2014-07-21 NOTE — Progress Notes (Signed)
Patient ID: [name], female   DOB: 11/24/1941, 73 y.o.   MRN: [id number]   This is an acute visit.  Level of care skilled.  Facility AF  Chief complaint-acute visit followup malaise-respiratory issues.  History of present illness.  Patient is a pleasant 73 year old female with a history of COPD with encephalopathy and hypercapnia as well as UTIs.  She was recently treated for encephalopathy thought secondary to several factors including hypercapnia or UTI.  I saw her last week for suspected malaise-this appears to be improved she does not have any complaints today labs came back fairly unremarkable except for a hemoglobin of 8.9 which is about a point below her baseline over the past month.--This is normocytic at 97.1 however per chart review it appears she has had readings in the low 100s-including 100.6 earlier this month  Of note her CO2 level was 31 which is stable for her.  Her vital signs continued to be stable she is sitting comfortably in her chair today and has no complaints of shortness of breath dizziness or increased weakness from baseline.  I do note during a recent hospitalization there was a drop in her hemoglobin and she was put on iron-her stools were negative for blood at that time past recommendation for a GI consult at some point.--  She does have a history of  Alcoholic cirrhosis.  Family medical social history as been reviewed per admission note on 07/05/2014.  Medications have been reviewed per MAR.  Review of systems.  There are no complaints of fever or chills does not complaining of increased fatigue today.  Skin does not complaining of itching or rashes.  Respiratory- is not complaining of shortness of breath or increased cough she is on oxygen chronically with a history of COPD  Cardiac as does not complaining of pain appears to have baseline lower extremity edema.  GI is not complaining of any abdominal pain nausea or vomiting diarrhea  or constipation.  GU is and dysuria.  Muscle skeletal is not complaining of any significant joint pain or increased weakness from baseline.  Neurologic is not complaining of any headache or dizziness.  Physical exam.  Temperature is 96.3 pulse 72 respirations 20 blood pressure 124/65.  In general this is a frail elderly female in no distress sitting comfortably in her chair.  Her skin is warm and dry.  Oropharynx clear mucous membranes moist.  Chest is clear to auscultation with somewhat reduced breath sounds there is no labored breathing.  Heart is regular rate and rhythm without murmur gallop or rub she has trace lower extremity edema which appears to be baseline. Muscle skeletal Limited exam since patient is sitting in chair but I do not note any deformity she is able to move all extremities x4.  Neurologic is grossly intact her speech is clear cranial nerves intact.  Psych she is at baseline appropriate to situation and mood she is pleasant and cooperative.  Labs.  07/21/2014.  Sodium 142 potassium 3.9 BUN 20 creatinine 0.9.--CO2 level-31  Liver function tests within normal limits except albumin of 2.7.  07/18/2014.  WBC 55 hemoglobin 8.9 platelets 120.  Assessment and plan.  #1-history of malaise-we did do a chest x-ray which did not show any acute process-this appears to be largely resolved-respiratory status appears stable-CO2 level was 31  recent lab which is stable for her.--She is receiving duo nebs as well as Pulmicort as needed  #2-anemia-of note she is on iron-- recommendation for GI consult at some  point per initial hospital discharge summary-we'll order this-also will update CBC tomorrow to ensure stability here her hemoglobin does appear to be trending down although guaic  stools were negative in the hospital-will order occult blood testing here as well  X 3  Also will obtain iron studies including total iron binding capacity B12 folate reticulocyte count  ferritin and iron saturation  [id number]

## 2014-08-13 ENCOUNTER — Non-Acute Institutional Stay (SKILLED_NURSING_FACILITY): Payer: Medicare Other | Admitting: Internal Medicine

## 2014-08-13 DIAGNOSIS — J9612 Chronic respiratory failure with hypercapnia: Secondary | ICD-10-CM

## 2014-08-13 DIAGNOSIS — D509 Iron deficiency anemia, unspecified: Secondary | ICD-10-CM

## 2014-08-13 DIAGNOSIS — R609 Edema, unspecified: Secondary | ICD-10-CM

## 2014-08-13 DIAGNOSIS — E119 Type 2 diabetes mellitus without complications: Secondary | ICD-10-CM

## 2014-08-13 DIAGNOSIS — E039 Hypothyroidism, unspecified: Secondary | ICD-10-CM

## 2014-08-13 DIAGNOSIS — K7030 Alcoholic cirrhosis of liver without ascites: Secondary | ICD-10-CM

## 2014-08-13 DIAGNOSIS — G9340 Encephalopathy, unspecified: Secondary | ICD-10-CM

## 2014-08-13 DIAGNOSIS — J9610 Chronic respiratory failure, unspecified whether with hypoxia or hypercapnia: Secondary | ICD-10-CM

## 2014-08-13 NOTE — Progress Notes (Signed)
Patient ID: [name], female   DOB: 1941/12/04, 73 y.o.   MRN: [id number]   this is a routine visit.  Level care skilled.  Facility [facility].   Chief Complaint   Medical management of chronic medical issues including COPD-diabetes-alcoholic cirrhosis-hypothyroidism.    Marland Kitchen     HPI: Patient is a pleasant 73 year old female with the above diagnoses she was recently hospitalized for COPD exasperation-actually has recovered fairly well from this-and since returning last month has been fairly stable.  She has numerous medical issues including a history of a pulmonary nodule that per Dr. Sheppard Coil would be difficult to workup considering her comorbidities  She had seen palliative care last month  Today she appears quite stable she is concerned about a rash on her arms and lower legs she thinks this is dry skin and I would tend to agree.  Also  states she does have some lower extremity edema although this appears to besomewhat chronic She does not complaining of any shortness of breath and again appears to be doing quite well considering her fragile status.  Patient's hemoglobin appears to have stabilized -- appeared to drop some from admission however has rebounded up to 9.6-she is on iron-will update this--she does have a GI consult pending  She does have a history of diabetes blood sugars appear quite variable averaging low mid 100s in the morning   the lowest that I see is 74--this is rare--at 11:30 blood sugars were somewhat more elevated higher 100s-300 range.  Also variable at 4:30 ranging from 107 352 average appears to be around 200-at at bedtime sugars range 88 up to 300+-quite a bit of variability average appears to be lower 200s-  Vital signs continued to be stable she states she has gained about 20 pounds over the past few weeks because she's been eating very well-will write an order for confirmation of this-.  - .  Past Medical History   Diagnosis  Date   .  Hypertension     .  COPD (chronic obstructive pulmonary disease)    .  Diabetes mellitus    .  Elevated cholesterol    .  History of ETOH abuse    .  Hypothyroid    .  Cirrhosis, alcoholic    .  Psychosis    .  Lymphoma      T cell   .  Stroke      Occipital 2004   .  Asthma    History reviewed. No pertinent past surgical history.    Medication List                    budesonide 0.5 MG/2ML nebulizer solution    Commonly known as: PULMICORT    Take 0.5 mg by nebulization daily as needed (for inflammation of lungs).              cholecalciferol 1000 UNITS tablet    Commonly known as: VITAMIN D    Take 4,000 Units by mouth every morning.    ferrous sulfate 325 (65 FE) MG tablet    Take 325 mg by mouth 2 (two) times daily with a meal.    insulin aspart 100 UNIT/ML injection    Commonly known as: novoLOG    Inject 2-8 Units into the skin 3 (three) times daily as needed for high blood sugar (*0-150= 0 units, 151-200= 2 units, 201-250= 4 units, 251-300= 6 units, 301-350= 8 units, >350 give 8 units and nofity  provider*).    insulin glargine 100 UNIT/ML injection    Commonly known as: LANTUS    Inject 10 Units into the skin at bedtime.    ipratropium-albuterol 0.5-2.5 (3) MG/3ML Soln    Commonly known as: DUONEB    Take 3 mLs by nebulization every 6 (six) hours as needed (for inflammation of lungs for acute episodes).    levothyroxine 100 MCG tablet    Commonly known as: SYNTHROID, LEVOTHROID    Take 100 mcg by mouth daily before breakfast.    multivitamin with minerals Tabs tablet    Take 1 tablet by mouth daily.    OLANZapine 10 MG tablet    Commonly known as: ZYPREXA    Take 10 mg by mouth at bedtime.              simvastatin 20 MG tablet    Commonly known as: ZOCOR    Take 20 mg by mouth every evening.    sitaGLIPtin 50 MG tablet    Commonly known as: JANUVIA    Take 50 mg by mouth daily.    ZENPEP 20000 UNITS Cpep    Generic drug: Pancrelipase (Lip-Prot-Amyl)    Take 2  capsules by mouth 4 (four) times daily.     No orders of the defined types were placed in this encounter.  Immunization History   Administered  Date(s) Administered   .  Pneumococcal Polysaccharide-23  06/13/2014    History   Substance Use Topics   .  Smoking status:  Former Smoker -- 0.50 packs/day for 56 years     Types:  Cigarettes     Quit date:  05/08/2013   .  Smokeless tobacco:  Never Used   .  Alcohol Use:  No      Comment: Sober since 32   Family history is noncontributory  Review of Systems  DATA OBTAINED: from patient; no c/o  GENERAL: no fevers, fatigue, appetite changes  SKIN: Complaint of rash on her arms and lower legs it does not itch EYES: No eye pain, redness, discharge  EARS: No earache, tinnitus, change in hearing  NOSE: No congestion, drainage or bleeding  MOUTH/THROAT: No mouth or tooth pain, No sore throat  RESPIRATORY: No cough, wheezing, SOB  CARDIAC: No chest pain, palpitations,   has  lower extremity edema  GI: No abdominal pain, No N/V/D or constipation, No heartburn or reflux  GU: No dysuria, frequency or urgency, or incontinence  MUSCULOSKELETAL: No unrelieved bone/joint pain  NEUROLOGIC: No headache, dizziness or focal weakness  PSYCHIATRIC: No overt anxiety or sadness. Sleeps well.                    Physical Exam  Temperature 97.3 pulse 65 respirations 18 blood pressure 102/59--137-73. Average systolic is about 409-811 -----O2 saturations have been in 90s this all appears baseline weight is pending   GENERAL APPEARANCE: Alert,mod conversant. Appropriately groomed. No acute distress.  SKIN: No diaphoresis --has a pale erythematous scaly rash of her lower arms elbow area and lower legs bilaterally-this appears to be more of a dry skin situation HEAD: Normocephalic, atraumatic   EYES: Conjunctiva/lids clear. Pupils round, reactive. EOMs intact.  EARS:  Hearing grossly normal.  NOSE: No deformity or discharge.  MOUTH/THROAT: Lips w/o  lesions  RESPIRATORY: Breathing is even, unlabored. Lung sounds are clear --shallow air entry but no congestion noted CARDIOVASCULAR: Heart RRR no murmurs, rubs or gallops. Appears to have baseline venous insufficiency peripheral edema-- somewhat increased  from baseline according to nursing staff she has had her feet in the dependent position.  GASTROINTESTINAL: Abdomen is soft, non-tender, not distended w/ normal bowel sounds. No mass, ventral or inguinal hernia. No organomegally    MUSCULOSKELETAL: No abnormal joints or musculature  NEUROLOGIC: Cranial nerves 2-12 grossly intact. Moves all extremities  PSYCHIATRIC: Mood and affect appropriate to situation, no behavioral issues pleasant-smiling-appropriate   Patient Active Problem List    Diagnosis  Date Noted   .  Acute and chronic respiratory failure  07/05/2014   .  Hypoxia  07/03/2014   .  Altered mental status  06/30/2014   .  Hypercarbia  06/30/2014   .  COPD exacerbation  06/30/2014   .  Encephalopathy acute  06/30/2014   .  Hypokalemia  06/13/2014   .  Anemia, iron deficiency  06/13/2014   .  Advanced dementia  06/13/2014   .  Acute kidney injury  06/13/2014   .  HCAP (healthcare-associated pneumonia)  06/11/2014   .  Anemia  06/11/2014   .  Pulmonary nodule: per CT chest 06/11/14  06/11/2014   .  UTI (urinary tract infection)  05/10/2014   .  Hypotension  05/08/2014   .  Hyperglycemia  05/08/2014   .  Leukocytosis  05/08/2014   .  AKI (acute kidney injury)  05/08/2014   .  Dehydration  05/08/2014   .  Septic shock(785.52)  10/08/2012   .  Atypical chest pain  09/01/2012   .  SOB (shortness of breath)  09/01/2012   .  Wheezing  09/01/2012   .  Elevated troponin  09/01/2012   .  Delirium  09/01/2012   .  COPD (chronic obstructive pulmonary disease)  07/19/2012   .  Smoker  07/19/2012   .  RESPIRATORY FAILURE, CHRONIC  07/25/2008   .  WEIGHT LOSS  04/30/2007   .  LYMPHOMA, T-CELL, CUTANEOUS  01/03/2007   .   HYPOTHYROIDISM  01/03/2007   .  DIABETES MELLITUS, TYPE II  01/03/2007   .  DYSLIPIDEMIA  01/03/2007   .  BIPOLAR AFFECTIVE DISORDER  01/03/2007   .  CIRRHOSIS, ALCOHOLIC  96/29/5284   .  CEREBROVASCULAR ACCIDENT, HX OF  01/03/2007     Labs  07/22/2014.  Folic acid greater than 24.3.    Vitamin B12-202.  WBC 7.6 hemoglobin 9.6 platelets 159.  Reticulocyte count 2.1.  Total iron binding capacity 277.  Ferritin 41.  07/18/2014.  Sodium 142 potassium 3.9 CO2 31 BUN 20 creatinine 0.9.  Liver function tests within normal limits except albumin of 2.7        Component  Value  Date/Time    WBC  8.8  06/30/2014 1410    RBC  3.86*  06/30/2014 1410    HGB  11.4*  06/30/2014 1410    HCT  38.6  06/30/2014 1410    PLT  159  06/30/2014 1410    MCV  100.0  06/30/2014 1410    LYMPHSABS  2.1  06/30/2014 1410    MONOABS  0.3  06/30/2014 1410    EOSABS  0.1  06/30/2014 1410    BASOSABS  0.0  06/30/2014 1410   CMP    Component  Value  Date/Time    NA  148*  07/01/2014 0434    K  4.3  07/01/2014 0434    CL  103  07/01/2014 0434    CO2  35*  07/01/2014 0434    GLUCOSE  150*  07/01/2014 0434    BUN  25*  07/01/2014 0434    CREATININE  1.02  07/01/2014 0434    CALCIUM  8.9  07/01/2014 0434    PROT  7.5  06/30/2014 1410    ALBUMIN  3.6  06/30/2014 1410    AST  18  06/30/2014 1410    ALT  19  06/30/2014 1410    ALKPHOS  64  06/30/2014 1410    BILITOT  0.2*  06/30/2014 1410    GFRNONAA  53*  07/01/2014 0434    GFRAA  62*  07/01/2014 0434   Assessment and Plan  Encephalopathy acute  appears to be secondary to hypercapnia or UTI or both--this has been stable since her return here which is encouraging although continues to be very vulnerable individual.  She continues on Pulmicort when necessary-DuoNeb nebulizers every 6 hours-     Acute and chronic respiratory failure  continue oxygen supplementation goal is to keep saturations in the low 90s--   At this point stable will update a metabolic panel--most recent CO2 level  shows normalizatio Of note she does have a history of a pulmonary nodule-she has been seen by palliative care-somewhat unclear whether they are following her as a palliative care service-I did review palliative care note on July 23  appears she was deemed not to be hospice eligible.  Per nursing staff understanding  is that palliative care is following her but we'll try to get confirmation of this and nursing staff is looking into it as well as social work---  HYPOTHYROIDISM  continue synthroid --TSH last month was 1.17 -  DIABETES MELLITUS, TYPE II  Continue  lantus dose.-  And junuvia sliding scale with meals--quite a bit of variability here I suspect some of this is related to her apparently significantly better appetite later in the day-for now update hemoglobin A1c suspect there will be some medication adjustments pending those results but she does have some history of hypoglycemia in the past  - carb modified diet  BIPOLAR AFFECTIVE DISORDER  Continue zyprexa --- at this point appear stable CIRRHOSIS, ALCOHOLIC  Continue pancrease--- ammonia level last month was 10-liver function tests fairly unremarkable except for albumin of 2.7   hyperlipidemia-will check a lipid panel   Anemia-her hemoglobin is trending up she is on iron-will update a CBC-she has a GI consult pending as well   Dermatitis unspecified-this appears to be dry skin will write an order for Eucerin cream twice a day and when necessary.  Monitor for changes.  . Edema-I suspect this could be dependent related-Will encourage leg elevation-also will write order to have patient weighed tomorrow  notify provider of recent weights for followup- Also will start spironolactone 12.5 mg a day for 3 days-we'll have to follow her metabolic panel closely again will order one tomorrow and also on Monday--this was discussed with Dr. Sheppard Coil via phone-   I do note she did have a cardiac echo done in October of 2013 showed normal  left ventricular function with ejection fraction 55-65%  [id number]-of note greater than 45 minutes spent assessing patient-reviewing her medical records-discussing her status with nursing staff-and coordinating and formulating a plan of care for numerous diagnoses--of note greater than 50% of time spent coordinating plan of care

## 2014-08-14 LAB — HEMOGLOBIN A1C: HEMOGLOBIN A1C: 7.4 % — AB (ref 4.0–6.0)

## 2014-09-11 ENCOUNTER — Non-Acute Institutional Stay (SKILLED_NURSING_FACILITY): Payer: Medicare Other | Admitting: Internal Medicine

## 2014-09-11 DIAGNOSIS — J42 Unspecified chronic bronchitis: Secondary | ICD-10-CM

## 2014-09-11 DIAGNOSIS — K7030 Alcoholic cirrhosis of liver without ascites: Secondary | ICD-10-CM

## 2014-09-11 DIAGNOSIS — D509 Iron deficiency anemia, unspecified: Secondary | ICD-10-CM

## 2014-09-11 DIAGNOSIS — R7309 Other abnormal glucose: Secondary | ICD-10-CM

## 2014-09-11 DIAGNOSIS — R739 Hyperglycemia, unspecified: Secondary | ICD-10-CM

## 2014-09-11 DIAGNOSIS — E039 Hypothyroidism, unspecified: Secondary | ICD-10-CM

## 2014-09-11 DIAGNOSIS — F319 Bipolar disorder, unspecified: Secondary | ICD-10-CM

## 2014-09-11 DIAGNOSIS — G9340 Encephalopathy, unspecified: Secondary | ICD-10-CM

## 2014-09-11 NOTE — Progress Notes (Signed)
Patient ID: [name], female   DOB: 1941-03-17, 73 y.o.   MRN: [id number]   this is a routine visit.  Level care skilled.  Facility [facility].   Chief Complaint   Medical management of chronic medical issues including COPD-diabetes-alcoholic cirrhosis-hypothyroidism.   Marland Kitchen    HPI: Patient is a pleasant 73 year old female with the above diagnoses she was recently hospitalized for COPD exasperation-actually has recovered fairly well from this-and since returning has been fairly stable.  She has numerous medical issues including a history of a pulmonary nodule that per Dr. Sheppard Coil would be difficult to workup considering her comorbidities  She has seen palliative care     She does not complaining of any shortness of breath and again appears to be doing quite well considering her fragile status.  Patient's hemoglobin appears to have stabilized -- appeared to drop some from admission however has rebounded up to 10.6  -she is on iron-will update this--she does have a GI consult pending  She does have a history of diabetes blood sugars appear quite variable averaging low mid 100s in the morning the lowest that I see is 74--this is rare--at 11:30 blood sugars were somewhat more elevated higher 100s-300 range.  Also variable at 4:30 ranging from 107 352 average appears to be around 200-at at bedtime sugars range 88 up to 300+-quite a bit of variability average appears to be lower 200s-  Vital signs continued to be stable she states she has gained about 20 pounds over the past few weeks because she's been eating very well-will write an order for confirmation of this-.  -  .  Past Medical History   Diagnosis  Date   .  Hypertension    .  COPD (chronic obstructive pulmonary disease)    .  Diabetes mellitus    .  Elevated cholesterol    .  History of ETOH abuse    .  Hypothyroid    .  Cirrhosis, alcoholic    .  Psychosis    .  Lymphoma      T cell   .  Stroke      Occipital 2004   .  Asthma     History reviewed. No pertinent past surgical history.    Medication List                    budesonide 0.5 MG/2ML nebulizer solution    Commonly known as: PULMICORT    Take 0.5 mg by nebulization daily as needed (for inflammation of lungs).             cholecalciferol 1000 UNITS tablet    Commonly known as: VITAMIN D    Take 4,000 Units by mouth every morning.    ferrous sulfate 325 (65 FE) MG tablet    Take 325 mg by mouth 2 (two) times daily with a meal.    insulin aspart 100 UNIT/ML injection    Commonly known as: novoLOG    Inject 2-8 Units into the skin 3 (three) times daily as needed for high blood sugar (*0-150= 0 units, 151-200= 2 units, 201-250= 4 units, 251-300= 6 units, 301-350= 8 units, >350 give 8 units and nofity provider*).    insulin glargine 100 UNIT/ML injection    Commonly known as: LANTUS    Inject 10 Units into the skin at bedtime.    ipratropium-albuterol 0.5-2.5 (3) MG/3ML Soln    Commonly known as: DUONEB    Take 3 mLs by  nebulization every 6 (six) hours as needed (for inflammation of lungs for acute episodes).    levothyroxine 100 MCG tablet    Commonly known as: SYNTHROID, LEVOTHROID    Take 100 mcg by mouth daily before breakfast.    multivitamin with minerals Tabs tablet    Take 1 tablet by mouth daily.    OLANZapine 10 MG tablet    Commonly known as: ZYPREXA    Take 10 mg by mouth at bedtime.             simvastatin 20 MG tablet    Commonly known as: ZOCOR    Take 20 mg by mouth every evening.    sitaGLIPtin 50 MG tablet    Commonly known as: JANUVIA    Take 50 mg by mouth daily.    ZENPEP 20000 UNITS Cpep    Generic drug: Pancrelipase (Lip-Prot-Amyl)    Take 2 capsules by mouth 4 (four) times daily.     No orders of the defined types were placed in this encounter.  Immunization History   Administered  Date(s) Administered   .  Pneumococcal Polysaccharide-23  06/13/2014    History   Substance Use Topics   .  Smoking status:   Former Smoker -- 0.50 packs/day for 56 years     Types:  Cigarettes     Quit date:  05/08/2013   .  Smokeless tobacco:  Never Used   .  Alcohol Use:  No      Comment: Sober since 2   Family history is noncontributory  Review of Systems  DATA OBTAINED: from patient; no c/o  GENERAL: no fevers, fatigue, appetite changes  SKIN: Does not complaining of rash or itching this evening  EYES: No eye pain, redness, discharge  EARS: No earache, tinnitus, change in hearing  NOSE: No congestion, drainage or bleeding  MOUTH/THROAT: No mouth or tooth pain, No sore throat  RESPIRATORY: No cough, wheezing, SOB  CARDIAC: No chest pain, palpitations, has  chronic lower extremity edema  GI: No abdominal pain, No N/V/D or constipation, No heartburn or reflux  GU: No dysuria, frequency or urgency, or incontinence  MUSCULOSKELETAL: No unrelieved bone/joint pain  NEUROLOGIC: No headache, dizziness or focal weakness  PSYCHIATRIC: No overt anxiety or sadness. Sleeps well.                   Physical Exam   Temperature 98.5-pulse 90-respirations 18-blood pressure 107/69-122/64- GENERAL APPEARANCE: Alert,mod conversant. Appropriately groomed. No acute distress sitting comfortably in her chair.  SKIN: No diaphoresis -  HEAD: Normocephalic, atraumatic  EYES: Conjunctiva/lids clear. Pupils round, reactive. EOMs intact.  EARS: Hearing grossly normal.  NOSE: No deformity or discharge.  MOUTH/THROAT: Lips w/o lesions  RESPIRATORY: Breathing is even, unlabored. Lungs essentially clear except for mild slight expiratory wheeze at bases CARDIOVASCULAR: Heart RRR no murmurs, rubs or gallops. Appears to have baseline venous insufficiency peripheral edema--I would say 1+  .  GASTROINTESTINAL: Abdomen is soft, non-tender, baseline protuberant w/ normal bowel sounds  MUSCULOSKELETAL: No abnormal joints or musculature  NEUROLOGIC: Cranial nerves 2-12 grossly intact. Moves all extremities  PSYCHIATRIC: Mood and  affect appropriate to situation, no behavioral issues pleasant-smiling-appropriate  Patient Active Problem List    Diagnosis  Date Noted   .  Acute and chronic respiratory failure  07/05/2014   .  Hypoxia  07/03/2014   .  Altered mental status  06/30/2014   .  Hypercarbia  06/30/2014   .  COPD exacerbation  06/30/2014   .  Encephalopathy acute  06/30/2014   .  Hypokalemia  06/13/2014   .  Anemia, iron deficiency  06/13/2014   .  Advanced dementia  06/13/2014   .  Acute kidney injury  06/13/2014   .  HCAP (healthcare-associated pneumonia)  06/11/2014   .  Anemia  06/11/2014   .  Pulmonary nodule: per CT chest 06/11/14  06/11/2014   .  UTI (urinary tract infection)  05/10/2014   .  Hypotension  05/08/2014   .  Hyperglycemia  05/08/2014   .  Leukocytosis  05/08/2014   .  AKI (acute kidney injury)  05/08/2014   .  Dehydration  05/08/2014   .  Septic shock(785.52)  10/08/2012   .  Atypical chest pain  09/01/2012   .  SOB (shortness of breath)  09/01/2012   .  Wheezing  09/01/2012   .  Elevated troponin  09/01/2012   .  Delirium  09/01/2012   .  COPD (chronic obstructive pulmonary disease)  07/19/2012   .  Smoker  07/19/2012   .  RESPIRATORY FAILURE, CHRONIC  07/25/2008   .  WEIGHT LOSS  04/30/2007   .  LYMPHOMA, T-CELL, CUTANEOUS  01/03/2007   .  HYPOTHYROIDISM  01/03/2007   .  DIABETES MELLITUS, TYPE II  01/03/2007   .  DYSLIPIDEMIA  01/03/2007   .  BIPOLAR AFFECTIVE DISORDER  01/03/2007   .  CIRRHOSIS, ALCOHOLIC  55/73/2202   .  CEREBROVASCULAR ACCIDENT, HX OF  01/03/2007   Labs.  08/19/2014.  Sodium 141-potassium 4.2 CO2 29-BUN 25 creatinine 1.3.  08/14/2014.  WBC 6.1-hemoglobin 10.6-platelets 144.  Liver function tests within normal limits except albumin of 3.4.  Hemoglobin A1c gas 7.4.  08/14/2014.  Cholesterol 124-HDL 52-LDL 61-triglycerides 54.   07/22/2014.  Folic acid greater than 24.3.  Vitamin B12-202.  WBC 7.6 hemoglobin 9.6 platelets 159.    Reticulocyte count 2.1.  Total iron binding capacity 277.  Ferritin 41.  07/18/2014.  Sodium 142 potassium 3.9 CO2 31 BUN 20 creatinine 0.9.  Liver function tests within normal limits except albumin of 2.7    Component  Value  Date/Time    WBC  8.8  06/30/2014 1410    RBC  3.86*  06/30/2014 1410    HGB  11.4*  06/30/2014 1410    HCT  38.6  06/30/2014 1410    PLT  159  06/30/2014 1410    MCV  100.0  06/30/2014 1410    LYMPHSABS  2.1  06/30/2014 1410    MONOABS  0.3  06/30/2014 1410    EOSABS  0.1  06/30/2014 1410    BASOSABS  0.0  06/30/2014 1410   CMP    Component  Value  Date/Time    NA  148*  07/01/2014 0434    K  4.3  07/01/2014 0434    CL  103  07/01/2014 0434    CO2  35*  07/01/2014 0434    GLUCOSE  150*  07/01/2014 0434    BUN  25*  07/01/2014 0434    CREATININE  1.02  07/01/2014 0434    CALCIUM  8.9  07/01/2014 0434    PROT  7.5  06/30/2014 1410    ALBUMIN  3.6  06/30/2014 1410    AST  18  06/30/2014 1410    ALT  19  06/30/2014 1410    ALKPHOS  64  06/30/2014 1410    BILITOT  0.2*  06/30/2014 1410  GFRNONAA  53*  07/01/2014 0434    GFRAA  62*  07/01/2014 0434   Assessment and Plan  Encephalopathy acute  appears to be secondary to hypercapnia or UTI or both--this has been stable since her return here which is encouraging although continues to be very vulnerable individual.  She continues on Pulmicort when necessary-DuoNeb nebulizers every 6 hours-  Acute and chronic respiratory failure  continue oxygen supplementation goal is to keep saturations in the low 90s--  At this point stable will update a metabolic panel--most recent CO2 level shows improvement --despite her fragile status has been quite stable now for an extended period with no complaints of increased shortness of breath or chest pain Of note she does have a history of a pulmonary nodule-she has been seen by palliative care- -per Dr. Sheppard Coil not a candidate for aggressive workup  HYPOTHYROIDISM  continue synthroid --recent TSH 1.17  -  DIABETES  MELLITUS, TYPE II  Continue lantus dose.- And junuvia sliding scale with meals--quite a bit of variability here I suspect some of this is related to her apparently significantly better appetite and apparently snacking and eating out with a friend.  Hemoglobin A1c 7.4.last month  CBGs in the morning somewhat variable in the mid 100s lately-later in the day tend to be more variable ranging from the 100s to 300s-sugars appear to be higher later in the day I suspect secondary to eating-somewhat challenging with the variability at this point --versus blood sugars on the 16th of this month was 7 4 in the morning later in the day was over 300 and then was down to 132      - carb modified diet  BIPOLAR AFFECTIVE DISORDER  Continue zyprexa --- at this point appear stable  CIRRHOSIS, ALCOHOLIC  Continue pancrease--- most recent ammonia level was 10-liver function tests fairly unremarkable except for albumin of 3.4--which appears to be rising   hyperlipidemia-lipid panel appears satisfactory last month   Anemia-her hemoglobin is trending up she is on iron-  History of edema-this appears to be relatively stable she did get a short course of spironolactone last month    -  I do note she did have a cardiac echo done in October of 2013 showed normal left ventricular function with ejection fraction 55-65%  Will update a basic metabolic panel  [id number]

## 2014-09-14 ENCOUNTER — Encounter: Payer: Self-pay | Admitting: Internal Medicine

## 2014-09-22 LAB — BASIC METABOLIC PANEL
BUN: 24 mg/dL — AB (ref 4–21)
CREATININE: 1.2 mg/dL — AB (ref 0.5–1.1)
Glucose: 169 mg/dL
Potassium: 4.2 mmol/L (ref 3.4–5.3)
Sodium: 142 mmol/L (ref 137–147)

## 2014-09-22 LAB — CBC AND DIFFERENTIAL
HCT: 38 % (ref 36–46)
Hemoglobin: 10.6 g/dL — AB (ref 12.0–16.0)
Platelets: 136 10*3/uL — AB (ref 150–399)
WBC: 6.6 10^3/mL

## 2014-09-22 LAB — TSH: TSH: 0.5 u[IU]/mL (ref 0.41–5.90)

## 2014-11-24 ENCOUNTER — Non-Acute Institutional Stay (SKILLED_NURSING_FACILITY): Payer: Medicare Other | Admitting: Nurse Practitioner

## 2014-11-24 DIAGNOSIS — R634 Abnormal weight loss: Secondary | ICD-10-CM

## 2014-11-24 DIAGNOSIS — E119 Type 2 diabetes mellitus without complications: Secondary | ICD-10-CM

## 2014-11-24 DIAGNOSIS — E559 Vitamin D deficiency, unspecified: Secondary | ICD-10-CM

## 2014-11-24 DIAGNOSIS — D509 Iron deficiency anemia, unspecified: Secondary | ICD-10-CM

## 2014-11-24 DIAGNOSIS — F3160 Bipolar disorder, current episode mixed, unspecified: Secondary | ICD-10-CM

## 2014-11-24 DIAGNOSIS — J42 Unspecified chronic bronchitis: Secondary | ICD-10-CM

## 2014-11-24 DIAGNOSIS — E039 Hypothyroidism, unspecified: Secondary | ICD-10-CM

## 2014-11-24 NOTE — Progress Notes (Signed)
Patient ID: [name], female   DOB: [birth year], 73 y.o.   MRN: [id number]    Nursing Home Location:  [place] of Service: SN[facility] ([id number])  PCP: [name], MD  Allergies  Allergen Reactions  . Bee Venom Anaphylaxis    Chief Complaint  Patient presents with  . Medical Management of Chronic Issues    HPI:  Patient is a 73 y.o. female seen today at [facility] for routine follow up on chronic conditions. Pt with a pmh of COPD, diabetes, hypothyroid, ETOH cirrhosis, bipolar,  Anemia, vit d def. Pt has been in her usual state of health over the last month without any acute issues. Pt has no complaints at this time.  Review of Systems:  Review of Systems  Constitutional: Negative for activity change, appetite change, fatigue and unexpected weight change.  HENT: Negative for congestion and hearing loss.   Eyes: Negative.   Respiratory: Negative for cough. Shortness of breath: on chronic O2, no worsening shortness of breath.    Cardiovascular: Negative for chest pain, palpitations and leg swelling.  Gastrointestinal: Negative for abdominal pain, diarrhea and constipation.  Genitourinary: Negative for dysuria and difficulty urinating.  Musculoskeletal: Negative for myalgias and arthralgias.  Skin: Negative for color change and wound.  Neurological: Negative for dizziness and weakness.  Psychiatric/Behavioral: Negative for behavioral problems, confusion and agitation.    Past Medical History  Diagnosis Date  . Hypertension   . COPD (chronic obstructive pulmonary disease)   . Diabetes mellitus   . Elevated cholesterol   . History of ETOH abuse   . Hypothyroid   . Cirrhosis, alcoholic   . Psychosis   . Lymphoma     T cell  . Stroke     Occipital 2004  . Asthma    No past surgical history on file. Social History:   reports that she quit smoking about 18 months ago. Her smoking use included Cigarettes. She  has a 28 pack-year smoking history. She has never used smokeless tobacco. She reports that she does not drink alcohol or use illicit drugs.  No family history on file.  Medications: Patient's Medications  New Prescriptions   No medications on file  Previous Medications   BUDESONIDE (PULMICORT) 0.5 MG/2ML NEBULIZER SOLUTION    Take 0.5 mg by nebulization daily as needed (for inflammation of lungs).   CHOLECALCIFEROL (VITAMIN D) 1000 UNITS TABLET    Take 4,000 Units by mouth every morning.   FERROUS SULFATE 325 (65 FE) MG TABLET    Take 325 mg by mouth 2 (two) times daily with a meal.   INSULIN ASPART (NOVOLOG) 100 UNIT/ML INJECTION    Inject 2-8 Units into the skin 3 (three) times daily as needed for high blood sugar (*0-150= 0 units, 151-200= 2 units, 201-250= 4 units, 251-300= 6 units, 301-350= 8 units, >350 give 8 units and nofity provider*).    INSULIN GLARGINE (LANTUS) 100 UNIT/ML INJECTION    Inject 10 Units into the skin at bedtime.   IPRATROPIUM-ALBUTEROL (DUONEB) 0.5-2.5 (3) MG/3ML SOLN    Take 3 mLs by nebulization every 6 (six) hours as needed (for inflammation of lungs for acute episodes).    LEVOTHYROXINE (SYNTHROID, LEVOTHROID) 100 MCG TABLET    Take 100 mcg by mouth daily before breakfast.   MULTIPLE VITAMIN (MULTIVITAMIN WITH MINERALS) TABS    Take 1 tablet by mouth daily.   OLANZAPINE (ZYPREXA) 10 MG TABLET    Take  10 mg by mouth at bedtime.   PANCRELIPASE, LIP-PROT-AMYL, (ZENPEP) 20000 UNITS CPEP    Take 2 capsules by mouth 4 (four) times daily.    SIMVASTATIN (ZOCOR) 20 MG TABLET    Take 20 mg by mouth every evening.   SITAGLIPTIN (JANUVIA) 50 MG TABLET    Take 50 mg by mouth daily.  Modified Medications   No medications on file  Discontinued Medications   No medications on file     Physical Exam: Filed Vitals:   11/24/14 1252  BP: 106/76  Pulse: 86  Temp: 98.1 F (36.7 C)  Resp: 20  Weight: 154 lb (69.854 kg)  SpO2: 97%    Physical Exam  Constitutional: She  is oriented to person, place, and time. No distress.  Thin female in NAD  HENT:  Head: Normocephalic and atraumatic.  Mouth/Throat: Oropharynx is clear and moist. No oropharyngeal exudate.  Eyes: Conjunctivae are normal. Pupils are equal, round, and reactive to light.  Neck: Normal range of motion. Neck supple.  Cardiovascular: Normal rate, regular rhythm and normal heart sounds.   Pulmonary/Chest: Effort normal and breath sounds normal.  Abdominal: Soft. Bowel sounds are normal.  Musculoskeletal: She exhibits no edema or tenderness.  Neurological: She is alert and oriented to person, place, and time.  Skin: Skin is warm and dry. She is not diaphoretic.  Psychiatric: She has a normal mood and affect.    Labs reviewed: Basic Metabolic Panel:  Recent Labs  06/11/14 2200  06/22/14 1618 06/30/14 1410 06/30/14 2132 07/01/14 0434 09/22/14  NA  --   < > 148* 146  --  148* 142  K  --   < > 3.9 4.4  --  4.3 4.2  CL  --   < > 99 101  --  103  --   CO2  --   < > 42* 37*  --  35*  --   GLUCOSE  --   < > 239* 186*  --  150*  --   BUN  --   < > 21 24*  --  25* 24*  CREATININE  --   < > 0.92 1.13*  --  1.02 1.2*  CALCIUM  --   < > 9.1 9.5  --  8.9  --   MG 1.7  --   --   --  1.6  --   --   PHOS  --   --   --   --  4.1  --   --   < > = values in this interval not displayed. Liver Function Tests:  Recent Labs  06/11/14 1542 06/12/14 0446 06/30/14 1410  AST 19 15 18   ALT 25 20 19   ALKPHOS 65 54 64  BILITOT <0.2* <0.2* 0.2*  PROT 6.4 5.8* 7.5  ALBUMIN 2.7* 2.6* 3.6   No results for input(s): LIPASE, AMYLASE in the last 8760 hours.  Recent Labs  06/30/14 1717  AMMONIA 10*   CBC:  Recent Labs  06/12/14 0446 06/13/14 1129 06/22/14 1618 06/30/14 1410 09/22/14  WBC 7.4 6.9 7.2 8.8 6.6  NEUTROABS 3.1  --  4.4 6.3  --   HGB 8.8* 8.7* 9.6* 11.4* 10.6*  HCT 29.7* 28.0* 33.0* 38.6 38  MCV 98.7 96.6 100.6* 100.0  --   PLT 149* 158 198 159 136*   TSH:  Recent Labs   05/09/14 0120 06/30/14 2132 09/22/14  TSH 5.600* 1.170 0.50   A1C: Lab Results  Component Value Date  HGBA1C 7.4* 08/14/2014   Lipid Panel: No results for input(s): CHOL, HDL, LDLCALC, TRIG, CHOLHDL, LDLDIRECT in the last 8760 hours.   Assessment/Plan 1. Chronic bronchitis, unspecified chronic bronchitis type On chronic O2, remains stable on current therapy without worsening shortness of breath or exacerbations since hospitalization in September.   2. Hypothyroidism, unspecified hypothyroidism type TSH stable on last labs, cont current synthroid dose  3. Type 2 diabetes mellitus without complication -fasting blood sugars range from 115-200, lunch and dinner cbgs sometimes elevated into the 400s -will increase SSI tom give 12 units for blood sugars over 350.  -also has no meal time coverage so will add novolog 5 units with meals in addition to SSI  4. Bipolar affective disorder, current episode mixed, current episode severity unspecified Mood stable on current medications, no changes in mood or behaviors noted   5. WEIGHT LOSS -weight has been stable, conts to be followed by RD -conts on supplements  6. Iron deficiency anemia -cbc stable, conts on iron  7. Vitamin D deficiency -will follow up Vit D level, pt currently on supplements

## 2015-02-24 DEATH — deceased
